# Patient Record
Sex: Male | Born: 1935 | Race: White | Hispanic: No | Marital: Single | Smoking: Never smoker
Health system: Southern US, Community
[De-identification: ages and names within clinical notes are randomized; demographics above are authoritative.]

## PROBLEM LIST (undated history)

## (undated) DIAGNOSIS — I10 Essential (primary) hypertension: Secondary | ICD-10-CM

## (undated) DIAGNOSIS — Z9989 Dependence on other enabling machines and devices: Secondary | ICD-10-CM

## (undated) DIAGNOSIS — K5792 Diverticulitis of intestine, part unspecified, without perforation or abscess without bleeding: Secondary | ICD-10-CM

## (undated) DIAGNOSIS — I442 Atrioventricular block, complete: Secondary | ICD-10-CM

## (undated) DIAGNOSIS — R001 Bradycardia, unspecified: Secondary | ICD-10-CM

## (undated) DIAGNOSIS — H35719 Central serous chorioretinopathy, unspecified eye: Secondary | ICD-10-CM

## (undated) DIAGNOSIS — I453 Trifascicular block: Secondary | ICD-10-CM

## (undated) DIAGNOSIS — H409 Unspecified glaucoma: Secondary | ICD-10-CM

## (undated) DIAGNOSIS — J45909 Unspecified asthma, uncomplicated: Secondary | ICD-10-CM

## (undated) DIAGNOSIS — H3530 Unspecified macular degeneration: Secondary | ICD-10-CM

## (undated) DIAGNOSIS — H35349 Macular cyst, hole, or pseudohole, unspecified eye: Secondary | ICD-10-CM

## (undated) DIAGNOSIS — E785 Hyperlipidemia, unspecified: Secondary | ICD-10-CM

## (undated) DIAGNOSIS — R55 Syncope and collapse: Secondary | ICD-10-CM

## (undated) DIAGNOSIS — N400 Enlarged prostate without lower urinary tract symptoms: Secondary | ICD-10-CM

## (undated) DIAGNOSIS — I472 Ventricular tachycardia: Secondary | ICD-10-CM

## (undated) DIAGNOSIS — M1990 Unspecified osteoarthritis, unspecified site: Secondary | ICD-10-CM

## (undated) DIAGNOSIS — H401190 Primary open-angle glaucoma, unspecified eye, stage unspecified: Secondary | ICD-10-CM

## (undated) DIAGNOSIS — G4733 Obstructive sleep apnea (adult) (pediatric): Secondary | ICD-10-CM

## (undated) DIAGNOSIS — K635 Polyp of colon: Secondary | ICD-10-CM

## (undated) DIAGNOSIS — G250 Essential tremor: Secondary | ICD-10-CM

## (undated) DIAGNOSIS — Z961 Presence of intraocular lens: Secondary | ICD-10-CM

## (undated) DIAGNOSIS — E7800 Pure hypercholesterolemia, unspecified: Secondary | ICD-10-CM

## (undated) HISTORY — DX: Presence of intraocular lens: Z96.1

## (undated) HISTORY — DX: Central serous chorioretinopathy, unspecified eye: H35.719

## (undated) HISTORY — DX: Bradycardia, unspecified: R00.1

## (undated) HISTORY — PX: BACK SURGERY: SHX140

## (undated) HISTORY — DX: Unspecified macular degeneration: H35.30

## (undated) HISTORY — DX: Primary open-angle glaucoma, unspecified eye, stage unspecified: H40.1190

## (undated) HISTORY — DX: Essential (primary) hypertension: I10

## (undated) HISTORY — DX: Hyperlipidemia, unspecified: E78.5

## (undated) HISTORY — DX: Diverticulitis of intestine, part unspecified, without perforation or abscess without bleeding: K57.92

## (undated) HISTORY — DX: Pure hypercholesterolemia, unspecified: E78.00

## (undated) HISTORY — DX: Benign prostatic hyperplasia without lower urinary tract symptoms: N40.0

## (undated) HISTORY — DX: Ventricular tachycardia: I47.2

## (undated) HISTORY — DX: Unspecified glaucoma: H40.9

## (undated) HISTORY — DX: Macular cyst, hole, or pseudohole, unspecified eye: H35.349

## (undated) HISTORY — DX: Obstructive sleep apnea (adult) (pediatric): G47.33

## (undated) HISTORY — DX: Trifascicular block: I45.3

## (undated) HISTORY — DX: Polyp of colon: K63.5

## (undated) HISTORY — DX: Syncope and collapse: R55

## (undated) HISTORY — PX: CATARACT EXTRACTION, BILATERAL: SHX1313

## (undated) HISTORY — PX: HERNIA REPAIR: SHX51

## (undated) HISTORY — DX: Unspecified asthma, uncomplicated: J45.909

## (undated) HISTORY — DX: Obstructive sleep apnea (adult) (pediatric): Z99.89

## (undated) HISTORY — DX: Unspecified osteoarthritis, unspecified site: M19.90

---

## 1959-04-29 HISTORY — PX: OTHER SURGICAL HISTORY: SHX169

## 2003-04-29 HISTORY — PX: REPLACEMENT TOTAL KNEE: SUR1224

## 2004-04-28 HISTORY — PX: VITRECTOMY: SHX106

## 2013-04-11 ENCOUNTER — Other Ambulatory Visit: Payer: Self-pay | Admitting: Internal Medicine

## 2013-04-11 DIAGNOSIS — R911 Solitary pulmonary nodule: Secondary | ICD-10-CM

## 2013-05-09 ENCOUNTER — Inpatient Hospital Stay: Admission: RE | Admit: 2013-05-09 | Payer: Self-pay | Source: Ambulatory Visit

## 2013-05-11 ENCOUNTER — Ambulatory Visit
Admission: RE | Admit: 2013-05-11 | Discharge: 2013-05-11 | Disposition: A | Discharge status: Home / Self Care | Payer: Medicare Other | Source: Ambulatory Visit | Attending: Internal Medicine | Admitting: Internal Medicine

## 2013-05-11 DIAGNOSIS — R911 Solitary pulmonary nodule: Secondary | ICD-10-CM

## 2013-05-11 MED ORDER — IOHEXOL 300 MG/ML  SOLN
75.0000 mL | Freq: Once | INTRAMUSCULAR | Status: AC | PRN
  Administered 2013-05-11: 75 mL via INTRAVENOUS

## 2014-03-31 ENCOUNTER — Encounter: Payer: Self-pay | Admitting: Neurology

## 2014-05-08 ENCOUNTER — Encounter: Payer: Self-pay | Admitting: *Deleted

## 2014-05-08 ENCOUNTER — Encounter: Payer: Self-pay | Admitting: Neurology

## 2014-05-08 ENCOUNTER — Ambulatory Visit (INDEPENDENT_AMBULATORY_CARE_PROVIDER_SITE_OTHER): Payer: 59 | Admitting: Neurology

## 2014-05-08 VITALS — BP 122/81 | HR 75 | Resp 12 | Ht 69.25 in | Wt 201.0 lb

## 2014-05-08 DIAGNOSIS — G4710 Hypersomnia, unspecified: Secondary | ICD-10-CM

## 2014-05-08 DIAGNOSIS — G4733 Obstructive sleep apnea (adult) (pediatric): Secondary | ICD-10-CM | POA: Insufficient documentation

## 2014-05-08 DIAGNOSIS — Z9989 Dependence on other enabling machines and devices: Principal | ICD-10-CM

## 2014-05-08 NOTE — Progress Notes (Signed)
SLEEP MEDICINE CLINIC   Provider:  Melvyn Novas, M D  Referring Provider: Ezequiel Kayser, MD Primary Care Physician:  Nicholas Kayser, MD  Chief Complaint  Patient presents with  . NP Nicholas Robbins  Sleep consult    OSA/cpap Rm 11, alone  . DME none here    Moved from [place] yr ago, needing DME co and supplies    HPI:  Nicholas Robbins is a 81 y.o. male  seen here as a referral from Dr. 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 He needs to establish himself with a new DME and sleep clinic.   He received his diagnosis of obstructive sleep apnea approximately 5 years ago and has used CPAP nightly. He carries the diagnosis of hypertension and has been treated with medications since 2000 he has benign prostate hyperplasia, and a coronary arthrosclerosis was diagnosed by chest CT in generally 2015. Dilated pulmonary arteries in generally 15 suspect that this is remote pulmonary embolism. And he had whooping cough as a child.  His usual bedtime is 10:30 PM, he will promptly go to sleep within 5-10 minutes. He lives alone and there is no report of snoring but he does not feel that there is any effect of this being present. He wakes up usually restored or refreshed and has the most energy in the morning. His overall nocturnal sleep time is more than 6 hours and he rises at 5:30 AM regularly. He sets breakfasts for the guess that the pain center the morning has this as a routine as well, he drinks 2 cups of coffee in the morning, very rarely any daytime caffeine especially not in the form of soda. Every other day he will get a daytime nap of 30 minutes duration. A power nap. And he feels that this is refreshing. He is using a nasal CPAP mask interface. This has been described as comfortable or at least not bothersome to him. He has 2 nocturias at night.   No trauma to  neck or face, no tonsllectomy or nasal surgery. No TBI.     We were able today to get a compliance report from Nicholas Robbins's CPAP machine and it shows 100% compliance. He has used the machine on average 7 hours and 57 minutes at night he uses a ramp start at 4 cm water and a CPAP pressure of 5.5 cm water. His overall AHI also known as the apnea hypotony index was not elicited by this reading. There has also been no alarm set for air leakage. He has a C-Flex setting of 2 cm water.       Review of Systems: Epworth sleepiness score is endorsed at 15 points and fatigue severity at 26 points. Symptoms of depression : not  present. Out of a complete 14 system review, the patient complains of only the following symptoms, and all other reviewed systems are negative.   Single, master's degree , religous studies. 10 semester.      Family History  Problem Relation Age of Onset  . Lymphoma Brother   . Ulcers Nicholas 69    bleeding/ deceased    Past Medical History  Diagnosis Date  . Hypertension     Past Surgical History  Procedure Laterality Date  . Broken ankle  1961  . Vitrectomy Left 2006  . Replacement total knee  2005  Current Outpatient Prescriptions  Medication Sig Dispense Refill  . aspirin 81 MG tablet Take 81 mg by mouth daily.    . cholecalciferol (VITAMIN D) 400 UNITS TABS tablet Take 1,000 Units by mouth daily.    . fexofenadine (ALLEGRA) 180 MG tablet Take 180 mg by mouth daily as needed for allergies or rhinitis.    . fluocinonide (LIDEX) 0.05 % external solution Apply 1 application topically 2 (two) times daily. Instil 2 gtts in ear twice weekly    . folic acid (FOLVITE) 800 MCG tablet Take 400 mcg by mouth daily.    . Garlic 1000 MG CAPS Take 1,000 mg by mouth 4 (four) times daily.    Marland Kitchen. glucosamine-chondroitin 500-400 MG tablet Take 1 tablet by mouth 2 (two) times daily.    . hyoscyamine (LEVSIN SL) 0.125 MG SL tablet Place 0.125 mg under the tongue every 8  (eight) hours as needed for cramping.    . Multiple Vitamin (MULTIVITAMIN) tablet Take 1 tablet by mouth daily. Shaklee vitamin w/o iron    . olmesartan (BENICAR) 40 MG tablet Take 40 mg by mouth daily.    . pravastatin (PRAVACHOL) 20 MG tablet Take 20 mg by mouth at bedtime.  3  . silodosin (RAPAFLO) 8 MG CAPS capsule Take 8 mg by mouth daily with breakfast.    . Travoprost, BAK Free, (TRAVATAN) 0.004 % SOLN ophthalmic solution Place 1 drop into both eyes at bedtime.    . triamcinolone (NASACORT ALLERGY 24HR) 55 MCG/ACT AERO nasal inhaler Place 2 sprays into the nose daily.    Marland Kitchen. UNABLE TO FIND Take 1 capsule by mouth 4 (four) times daily. Med Name: Juiceplus    . UNABLE TO FIND Med Name: cod liver oil  1 tsp qday    . vitamin B-12 (CYANOCOBALAMIN) 1000 MCG tablet Take 1,000 mcg by mouth daily.     No current facility-administered medications for this visit.    Allergies as of 05/08/2014  . (No Known Allergies)    Vitals: BP 122/81 mmHg  Pulse 75  Resp 12  Ht 5' 9.25" (1.759 m)  Wt 201 lb (91.173 kg)  BMI 29.47 kg/m2 Last Weight:  Wt Readings from Last 1 Encounters:  05/08/14 201 lb (91.173 kg)       Last Height:   Ht Readings from Last 1 Encounters:  05/08/14 5' 9.25" (1.759 m)    Physical exam:  General: The patient is awake, alert and appears not in acute distress. The patient is well groomed. Head: Normocephalic, atraumatic. Neck is supple. Mallampati 3 ,  neck circumference: 16. Nasal airflow congested, TMJ is by click on the right jaw evident . Retrognathia is not seen.  Cardiovascular:  Regular rate and rhythm, without  murmurs or carotid bruit, and without distended neck veins. Respiratory: Lungs are clear to auscultation. Skin:  Without evidence of edema, or rash Trunk: scoliosis   Neurologic exam : The patient is awake and alert, oriented to place and time.   Memory subjective   described as intact. There is a normal attention span & concentration ability. Speech  is fluent without  dysarthria, dysphonia or aphasia. Mood and affect are appropriate.  Cranial nerves: Pupils are equal and unreactive to light. Funduscopic exam with evidence of a air-bubble, cataract related lens replacement refraction on the left Extraocular movements in vertical and horizontal planes intact and without nystagmus. Visual fields by finger perimetry are intact. Hearing to finger rub intact.  Facial sensation intact to fine touch. Facial motor  strength is symmetric and tongue and uvula move midline.  Motor exam:   Normal tone ,muscle bulk and symmetric strength in all extremities.  Sensory:  Fine touch, pinprick and vibration were tested in all extremities. Proprioception is  normal.  Coordination: Rapid alternating movements in the fingers/hands is normal. Finger-to-nose maneuver  normal without evidence of ataxia, dysmetria or tremor.  Gait and station: Patient walks without assistive device and is able unassisted to climb up to the exam table. Strength within normal limits. Stance is stable and normal.  Deep tendon reflexes: in the  upper and lower extremities are symmetric and intact. Babinski maneuver  downgoing.   Assessment:  After physical and neurologic examination, review of laboratory studies, imaging, neurophysiology testing and pre-existing records, assessment is :  OSA diagnosed 2010 in [place] - degree of apnea not known, he was told he was " severe " . My download in- office did not elicit an AHI.    The patient was advised of the nature of the diagnosed sleep disorder , the treatment options and risks for general a health and wellness arising from not treating the condition. Visit duration was 30 minutes.  More than 505 of the time were reserved for information, discussion about OSA and the treatment option.   Plan:  Treatment plan and additional workup :  Patient with likely moderate to severe OSA by history , now compliant 100% on CPAP.  Still elevated  Epworth suggestive of hypersomnia, day time naps , etc.  I would like to refer to DME advanced home care, he would otherwise need a loaner machine for a AHI download. He needs new supplies and would like to stay with the style of jiscurrent interface and head gear. Porfirio Mylar Jolee Critcher MD  05/08/2014

## 2014-06-16 DIAGNOSIS — R9439 Abnormal result of other cardiovascular function study: Secondary | ICD-10-CM | POA: Insufficient documentation

## 2014-08-28 ENCOUNTER — Ambulatory Visit: Payer: 59 | Admitting: Neurology

## 2014-10-09 ENCOUNTER — Ambulatory Visit (INDEPENDENT_AMBULATORY_CARE_PROVIDER_SITE_OTHER): Payer: Medicare Other | Admitting: Neurology

## 2014-10-09 ENCOUNTER — Encounter: Payer: Self-pay | Admitting: Neurology

## 2014-10-09 VITALS — BP 98/60 | HR 72 | Resp 20 | Ht 69.29 in | Wt 196.0 lb

## 2014-10-09 DIAGNOSIS — G4733 Obstructive sleep apnea (adult) (pediatric): Secondary | ICD-10-CM

## 2014-10-09 DIAGNOSIS — Z9989 Dependence on other enabling machines and devices: Principal | ICD-10-CM

## 2014-10-09 NOTE — Patient Instructions (Signed)
CPAP care intructions  given ,   [facility] to follow,

## 2014-10-09 NOTE — Progress Notes (Signed)
SLEEP MEDICINE CLINIC   Provider:  Melvyn Novas, M D  Referring Provider: Rodrigo Ran, MD Primary Care Physician:  Nicholas Kayser, MD  Chief Complaint  Patient presents with  . Follow-up    cpap f/u, rm 10, alone    HPI:  Nicholas Robbins is a 81 y.o. 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 He needs to establish himself with a new DME and sleep clinic.  He received his diagnosis of obstructive sleep apnea approximately 5 years ago and has used CPAP nightly. He carries the diagnosis of hypertension and has been treated with medications since 2000 he has benign prostate hyperplasia, and a coronary arthrosclerosis was diagnosed by chest CT in generally 2015. Dilated pulmonary arteries in generally 15 suspect that this is remote pulmonary embolism. And he had whooping cough as a child. His usual bedtime is 10:30 PM, he will promptly go to sleep within 5-10 minutes. He lives alone and there is no report of snoring but he does not feel that there is any effect of this being present. He wakes up usually restored or refreshed and has the most energy in the morning. His overall nocturnal sleep time is more than 6 hours and he rises at 5:30 AM regularly. He sets breakfasts for the guess that the pain center the morning has this as a routine as well, he drinks 2 cups of coffee in the morning, very rarely any daytime caffeine especially not in the form of soda. Every other day he will get a daytime nap of 30 minutes duration. A power nap. And he feels that this is refreshing. He is using a nasal CPAP mask interface. This has been described as comfortable or at least not bothersome to him. He has 2 nocturias at night. No trauma to neck or face, no tonsllectomy or nasal surgery. No TBI.  We were able today to get a compliance  report from Nicholas Robbins's CPAP machine and it shows 100% compliance. He has used the machine on average 7 hours and 57 minutes at night he uses a ramp start at 4 cm water and a CPAP pressure of 5.5 cm water. His overall AHI also known as the apnea hypotony index was not elicited by this reading. There has also been no alarm set for air leakage. He has a C-Flex setting of 2 cm water.   10-09-14 : Continues to work at [employer], [address]. He uses CPAP faithfully-  100% over 4 hours, residual AHI 2 at 5.5 cm water, very low.    Review of Systems: Epworth sleepiness score is endorsed at  11 from 15 points and fatigue severity at  33 from 26 points.  Symptoms of depression : not  present. Out of a complete 14 system review, the patient complains of only the following symptoms, and all other reviewed systems are negative. Single, master's degree , religous studies. 10 semester.      Family History  Problem Relation Age of Onset  . Lymphoma Brother   . Ulcers Nicholas 2    bleeding/ deceased    Past Medical History  Diagnosis Date  . Hypertension   . BPH (benign prostatic hyperplasia)     Past Surgical History  Procedure Laterality Date  . Broken ankle  1961  . Vitrectomy  Left 2006  . Replacement total knee  2005    Current Outpatient Prescriptions  Medication Sig Dispense Refill  . aspirin 81 MG tablet Take 81 mg by mouth daily.    . carvedilol (COREG) 6.25 MG tablet Take 6.25 mg by mouth 2 (two) times daily.  2  . cholecalciferol (VITAMIN D) 400 UNITS TABS tablet Take 1,000 Units by mouth daily.    . fexofenadine (ALLEGRA) 180 MG tablet Take 180 mg by mouth daily as needed for allergies or rhinitis.    . finasteride (PROSCAR) 5 MG tablet TAKE 1 TABLET DAILY AS DIRECTED.  3  . fluocinonide (LIDEX) 0.05 % external solution Apply 1 application topically 2 (two) times daily. Instil 2 gtts in ear twice weekly    . folic acid (FOLVITE) 800 MCG tablet Take 400 mcg by  mouth daily.    . Garlic 1000 MG CAPS Take 1,000 mg by mouth 4 (four) times daily.    Marland Kitchen glucosamine-chondroitin 500-400 MG tablet Take 1 tablet by mouth 2 (two) times daily.    . hyoscyamine (LEVSIN SL) 0.125 MG SL tablet Place 0.125 mg under the tongue every 8 (eight) hours as needed for cramping.    . Multiple Vitamin (MULTIVITAMIN) tablet Take 1 tablet by mouth daily. Shaklee vitamin w/o iron    . olmesartan (BENICAR) 40 MG tablet Take 20 mg by mouth daily.     . pravastatin (PRAVACHOL) 20 MG tablet Take 40 mg by mouth at bedtime.   3  . silodosin (RAPAFLO) 8 MG CAPS capsule Take 8 mg by mouth daily with breakfast.    . Travoprost, BAK Free, (TRAVATAN) 0.004 % SOLN ophthalmic solution Place 1 drop into both eyes at bedtime.    . triamcinolone (NASACORT ALLERGY 24HR) 55 MCG/ACT AERO nasal inhaler Place 2 sprays into the nose daily.    Marland Kitchen UNABLE TO FIND Take 1 capsule by mouth 4 (four) times daily. Med Name: Juiceplus    . UNABLE TO FIND Med Name: cod liver oil  1 tsp qday    . vitamin B-12 (CYANOCOBALAMIN) 1000 MCG tablet Take 1,000 mcg by mouth daily.     No current facility-administered medications for this visit.    Allergies as of 10/09/2014  . (No Known Allergies)    Vitals: BP 98/60 mmHg  Pulse 72  Resp 20  Ht 5' 9.29" (1.76 m)  Wt 196 lb (88.905 kg)  BMI 28.70 kg/m2 Last Weight:  Wt Readings from Last 1 Encounters:  10/09/14 196 lb (88.905 kg)       Last Height:   Ht Readings from Last 1 Encounters:  10/09/14 5' 9.29" (1.76 m)    Physical exam:  General: The patient is awake, alert and appears not in acute distress. The patient is well groomed. Head: Normocephalic, atraumatic. Neck is supple. Mallampati 3 ,  neck circumference: 16. Nasal airflow congested, TMJ is by click on the right jaw evident . Retrognathia is not seen.  Cardiovascular:  Regular rate and rhythm, without  murmurs or carotid bruit, and without distended neck veins. Respiratory: Lungs are clear to  auscultation. Skin:  Without evidence of edema, or rash Trunk: scoliosis   Neurologic exam : The patient is awake and alert, oriented to place and time.   Memory subjective described as intact. There is a normal attention span & concentration ability. Speech is fluent without  dysarthria, dysphonia or aphasia.  Mood and affect are appropriate.  Cranial nerves: Pupils are equal and unreactive to light. Funduscopic  exam with evidence of a air-bubble, cataract related lens replacement refraction on the left Extraocular movements in vertical and horizontal planes intact and without nystagmus. Visual fields by finger perimetry are intact. Hearing to finger rub intact. Facial sensation intact to fine touch. Facial motor strength is symmetric and tongue and uvula move midline.   Assessment:  After physical and neurologic examination, review of laboratory studies, imaging, neurophysiology testing and pre-existing records, assessment is :  OSA diagnosed 2010 in [place] - degree of apnea not known, he was told he was " severe " . My download in- office did not elicit an AHI.    The patient was advised of the nature of the diagnosed sleep disorder , the treatment options and risks for general a health and wellness arising from not treating the condition. Visit duration was 30 minutes.  More than 505 of the time were reserved for information, discussion about OSA and the treatment option.   Plan:  Treatment plan and additional workup :  Patient with likely moderate to severe OSA by history , compliant 100% on CPAP.  I will reduce the RAMP time from 30 minutes to 8 minutes.   I would like to refer to DME advanced home care, he would otherwise need a loaner machine for a AHI download.  He needs new supplies and would like to stay with the style of current interface and head gear. Porfirio Mylar Kimberlynn Lumbra MD  10/09/2014

## 2015-03-12 ENCOUNTER — Other Ambulatory Visit: Payer: Self-pay | Admitting: Internal Medicine

## 2015-03-12 DIAGNOSIS — M545 Low back pain: Secondary | ICD-10-CM

## 2015-03-14 ENCOUNTER — Ambulatory Visit
Admission: RE | Admit: 2015-03-14 | Discharge: 2015-03-14 | Disposition: A | Discharge status: Home / Self Care | Payer: Medicare Other | Source: Ambulatory Visit | Attending: Internal Medicine | Admitting: Internal Medicine

## 2015-03-14 ENCOUNTER — Other Ambulatory Visit: Payer: Self-pay

## 2015-03-14 DIAGNOSIS — M545 Low back pain: Secondary | ICD-10-CM

## 2015-04-12 ENCOUNTER — Ambulatory Visit: Payer: Medicare Other | Admitting: Neurology

## 2015-04-24 ENCOUNTER — Encounter: Payer: Self-pay | Admitting: Neurology

## 2015-04-24 ENCOUNTER — Ambulatory Visit (INDEPENDENT_AMBULATORY_CARE_PROVIDER_SITE_OTHER): Payer: Medicare Other | Admitting: Neurology

## 2015-04-24 VITALS — BP 110/60 | HR 68 | Resp 20 | Ht 69.0 in | Wt 199.0 lb

## 2015-04-24 DIAGNOSIS — G4733 Obstructive sleep apnea (adult) (pediatric): Secondary | ICD-10-CM

## 2015-04-24 DIAGNOSIS — Z9989 Dependence on other enabling machines and devices: Principal | ICD-10-CM

## 2015-04-24 NOTE — Progress Notes (Signed)
SLEEP MEDICINE CLINIC   Provider:  Melvyn Novas, M D  Referring Provider: Rodrigo Ran, MD Primary Care Physician:  Nicholas Kayser, MD  Chief Complaint  Patient presents with  . Follow-up    cpap, going well, rm 11, alone    HPI:  Nicholas Robbins is a 81 y.o. male  seen here as a referral from Dr. 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 He needs to establish himself with a new DME and sleep clinic.  He received his diagnosis of obstructive sleep apnea approximately 5 years ago and has used CPAP nightly. He carries the diagnosis of hypertension and has been treated with medications since 2000 he has benign prostate hyperplasia, and a coronary arthrosclerosis was diagnosed by chest CT in generally 2015. Dilated pulmonary arteries in generally 15 suspect that this is remote pulmonary embolism. And he had whooping cough as a child.  His usual bedtime is 10:30 PM, he will promptly go to sleep within 5-10 minutes. He lives alone and there is no report of snoring but he does not feel that there is any effect of this being present. He wakes up usually restored or refreshed and has the most energy in the morning. His overall nocturnal sleep time is more than 6 hours and he rises at 5:30 AM regularly. He sets breakfasts for the guess that the pain center the morning has this as a routine as well, he drinks 2 cups of coffee in the morning, very rarely any daytime caffeine especially not in the form of soda. Every other day he will get a daytime nap of 30 minutes duration. A power nap. And he feels that this is refreshing. He is using a nasal CPAP mask interface. This has been described as comfortable or at least not bothersome to him. He has 2 nocturias at night. No trauma to neck or face, no tonsllectomy or nasal surgery. No TBI.  We were able today to get a  compliance report from Nicholas Robbins's CPAP machine and it shows 100% compliance. He has used the machine on average 7 hours and 57 minutes at night he uses a ramp start at 4 cm water and a CPAP pressure of 5.5 cm water. His overall AHI also known as the apnea hypotony index was not elicited by this reading. There has also been no alarm set for air leakage. He has a C-Flex setting of 2 cm water.   10-09-14 : Continues to work at [employer], [address]. He uses CPAP faithfully-  100% over 4 hours, residual AHI 2 at 5.5 cm water, very low.   04-24-15: I reviewed today the CPAP download including the days of 12-20 6-16. The patient again has 100% compliance and a correlative usage of over 8 days and 22 hours. Average daily usage is 7 hours and 9 minutes. The patient is highly compliant but his residual AHI is not visible-  He is on a C-Flex setting of 2 cm water. He sleeps on average about 7 hours at night, going to bed at 10:30 and waking up at 5:30. He falls asleep promptly.   Review of Systems: Epworth sleepiness score is endorsed at  11 from 15 points and fatigue severity at  33 from 26 points.  Symptoms of depression : not  present. Out of a complete 14 system review, the  patient complains of only the following symptoms, and all other reviewed systems are negative. Single, master's degree , religous studies. 10 semester.      Family History  Problem Relation Age of Onset  . Lymphoma Brother   . Ulcers Nicholas 86    bleeding/ deceased    Past Medical History  Diagnosis Date  . Hypertension   . BPH (benign prostatic hyperplasia)     Past Surgical History  Procedure Laterality Date  . Broken ankle  1961  . Vitrectomy Left 2006  . Replacement total knee  2005    Current Outpatient Prescriptions  Medication Sig Dispense Refill  . aspirin 81 MG tablet Take 81 mg by mouth daily.    Marland Kitchen BENICAR 20 MG tablet Take 20 mg by mouth daily.  3  . carvedilol (COREG) 6.25 MG  tablet Take 6.25 mg by mouth 2 (two) times daily.  2  . cholecalciferol (VITAMIN D) 400 UNITS TABS tablet Take 1,000 Units by mouth daily.    . finasteride (PROSCAR) 5 MG tablet TAKE 1 TABLET DAILY AS DIRECTED.  3  . fluocinonide (LIDEX) 0.05 % external solution Apply 1 application topically 2 (two) times daily. Instil 2 gtts in ear twice weekly    . folic acid (FOLVITE) 800 MCG tablet Take 400 mcg by mouth daily.    . Garlic 1000 MG CAPS Take 1,000 mg by mouth 4 (four) times daily.    Marland Kitchen glucosamine-chondroitin 500-400 MG tablet Take 1 tablet by mouth 2 (two) times daily.    . hyoscyamine (LEVSIN SL) 0.125 MG SL tablet Place 0.125 mg under the tongue every 8 (eight) hours as needed for cramping.    . Multiple Vitamin (MULTIVITAMIN) tablet Take 1 tablet by mouth daily. Shaklee vitamin w/o iron    . pravastatin (PRAVACHOL) 40 MG tablet Take 40 mg by mouth at bedtime.  2  . silodosin (RAPAFLO) 8 MG CAPS capsule Take 8 mg by mouth daily with breakfast.    . Travoprost, BAK Free, (TRAVATAN) 0.004 % SOLN ophthalmic solution Place 1 drop into both eyes at bedtime.    . triamcinolone (NASACORT ALLERGY 24HR) 55 MCG/ACT AERO nasal inhaler Place 2 sprays into the nose daily.    Marland Kitchen UNABLE TO FIND Take 1 capsule by mouth 4 (four) times daily. Med Name: Juiceplus    . UNABLE TO FIND Med Name: cod liver oil  1 tsp qday    . vitamin B-12 (CYANOCOBALAMIN) 1000 MCG tablet Take 1,000 mcg by mouth daily.     No current facility-administered medications for this visit.    Allergies as of 04/24/2015  . (No Known Allergies)    Vitals: BP 110/60 mmHg  Pulse 68  Resp 20  Ht  (1.753 m)  Wt 199 lb (90.266 kg)  BMI 29.37 kg/m2 Last Weight:  Wt Readings from Last 1 Encounters:  04/24/15 199 lb (90.266 kg)       Last Height:   Ht Readings from Last 1 Encounters:  04/24/15  (1.753 m)    Physical exam:  General: The patient is awake, alert and appears not in acute distress. The patient is well  groomed. Head: Normocephalic, atraumatic. Neck is supple. Mallampati 3.  neck circumference: 16. Nasal airflow congested, TMJ is by click on the right jaw evident . Retrognathia is not seen.  Cardiovascular:  Regular rate and rhythm, without  murmurs or carotid bruit, and without distended neck veins. Respiratory: Lungs are clear to auscultation. Skin:  Without evidence  of edema, or rash Trunk: scoliosis  , lumbar fusion, hip pain.  Neurologic exam : The patient is awake and alert, oriented to place and time.   Memory subjective described as intact. There is a normal attention span & concentration ability. Speech is fluent without  dysarthria, dysphonia or aphasia.  Mood and affect are appropriate.  Cranial nerves: Pupils are equal and unreactive to light. Funduscopic exam with evidence of a air-bubble,bilaterally  cataract related lens replacement   , no  nystagmus. Visual fields by finger perimetry are intact. Hearing to finger rub intact. Facial sensation intact to fine touch. Facial motor strength is symmetric and tongue and uvula move midline.   Assessment:  After physical and neurologic examination, review of laboratory studies, imaging, neurophysiology testing and pre-existing records, assessment is :  OSA diagnosed 2010 in [place] - degree of apnea not known, he was told he was " severe " . My download in- office did not elicit an AHI.   The patient was advised of the nature of the diagnosed sleep disorder , the treatment options and risks for general a health and wellness arising from not treating the condition. Visit duration was 10 minutes.  More than 50% of the time were reserved for information, discussion about OSA and the treatment option.    Plan:  Treatment plan and additional workup :  Patient with likely moderate to severe OSA by history , compliant 100% on CPAP.  RAMP time 8 minutes. Rhinitis treated with Nasocort, but little reduction. Try Zyrtec daily.  I would like  to refer to [facility], he would otherwise need a loaner machine for a AHI download.  He needs new supplies and would like to stay with the style of current interface and head gear.   Nocturia once at night.  He is receiving steroid injections for back pain, and right hip pain- Dr. Danielle DessElsner sees him. obtained an MRI 2-3-4-5 lumbar pain, status post fusion in [place].  Rv with CPAP yearly.  I like to obtain a residual AHI - please have DME send a special download. [facility].   Porfirio Mylararmen Salina Stanfield MD  04/24/2015

## 2016-02-19 ENCOUNTER — Telehealth: Payer: Self-pay | Admitting: Neurology

## 2016-02-19 DIAGNOSIS — Z9989 Dependence on other enabling machines and devices: Secondary | ICD-10-CM

## 2016-02-19 DIAGNOSIS — G4733 Obstructive sleep apnea (adult) (pediatric): Secondary | ICD-10-CM

## 2016-02-19 NOTE — Telephone Encounter (Signed)
Pt called to advise he needs a new mask and tubing. Pt said he does not know name of the machine he has.

## 2016-02-19 NOTE — Addendum Note (Signed)
Addended by: Geronimo RunningINKINS, Madiline Saffran A on: 02/19/2016 02:25 PM   Modules accepted: Orders

## 2016-02-19 NOTE — Telephone Encounter (Signed)
I spoke to pt. He needs an order for a [facility] mask and tubing [facility]. I advised him that we would send that order today but to give [facility] 48 hours to process the order. Pt verbalized understanding.

## 2016-04-14 ENCOUNTER — Encounter: Payer: Self-pay | Admitting: Internal Medicine

## 2016-04-15 ENCOUNTER — Encounter: Payer: Self-pay | Admitting: Gastroenterology

## 2016-04-23 ENCOUNTER — Ambulatory Visit: Payer: Medicare Other | Admitting: Neurology

## 2016-04-30 ENCOUNTER — Encounter: Payer: Self-pay | Admitting: Gastroenterology

## 2016-04-30 ENCOUNTER — Ambulatory Visit (INDEPENDENT_AMBULATORY_CARE_PROVIDER_SITE_OTHER): Payer: Medicare Other | Admitting: Gastroenterology

## 2016-04-30 VITALS — BP 120/70 | HR 60 | Ht 69.0 in | Wt 195.4 lb

## 2016-04-30 DIAGNOSIS — R110 Nausea: Secondary | ICD-10-CM | POA: Diagnosis not present

## 2016-04-30 DIAGNOSIS — R1032 Left lower quadrant pain: Secondary | ICD-10-CM | POA: Insufficient documentation

## 2016-04-30 NOTE — Patient Instructions (Signed)
You have been scheduled for a CT scan of the abdomen and pelvis at [facility] ([address]---this is in the same building as [facility]).   You are scheduled on 05/01/16 at 2:30 pm. You should arrive 15 minutes prior to your appointment time for registration. Please follow the written instructions below on the day of your exam:  WARNING: IF YOU ARE ALLERGIC TO IODINE/X-RAY DYE, PLEASE NOTIFY RADIOLOGY IMMEDIATELY AT [phone]! YOU WILL BE GIVEN A 13 HOUR PREMEDICATION PREP.  1) Do not eat or drink anything after 10:30 am (4 hours prior to your test) 2) You have been given 2 bottles of oral contrast to drink. The solution may taste better if refrigerated, but do NOT add ice or any other liquid to this solution. Shake well before drinking.    Drink 1 bottle of contrast @ 12:30 pm (2 hours prior to your exam)  Drink 1 bottle of contrast @ 1:30 pm  (1 hour prior to your exam)  You may take any medications as prescribed with a small amount of water except for the following: Metformin, Glucophage, Glucovance, Avandamet, Riomet, Fortamet, Actoplus Met, Janumet, Glumetza or Metaglip. The above medications must be held the day of the exam AND 48 hours after the exam.  The purpose of you drinking the oral contrast is to aid in the visualization of your intestinal tract. The contrast solution may cause some diarrhea. Before your exam is started, you will be given a small amount of fluid to drink. Depending on your individual set of symptoms, you may also receive an intravenous injection of x-ray contrast/dye. Plan on being at [facility] for 30 minutes or longer, depending on the type of exam you are having performed.  This test typically takes 30-45 minutes to complete.  If you have any questions regarding your exam or if you need to reschedule, you may call the CT department at [phone] between the hours of 8:00 am and 5:00 pm,  Monday-Friday.  ________________________________________________________________________

## 2016-04-30 NOTE — Progress Notes (Signed)
04/30/2016 SHARVIL HOEY [id number] [birth year]   HISTORY OF PRESENT ILLNESS:  This is an 83 year old male who is new to our practice. He was referred here by Dr. Waynard Edwards for evaluation of left lower quadrant abdominal pain. He tells me that this began about 8 or so weeks ago. He started having left lower quadrant abdominal pain which, he describes as sometimes modest in severity. Also has had associated nausea and possibly some more frequent stools. He saw his PCP and was treated for diverticulitis with Cipro and Flagyl. He says that treatment made no difference in his symptoms. He denies any vomiting. He denies fevers, chills, changes in appetite, or weight loss. He tells me that he has a history of irritable bowel syndrome with frequent stools, but had not had any problems with that for several years. He had some hyoscyamine at home left from previous issues with irritable bowel and started taking some of that recently (taking it twice daily). That does seem to help somewhat. His last colonoscopy he says was about 4 or 5 years ago in [place], but cannot recall exact results (maybe some polyps). Labs by his PCP on December 4 showed a normal CBC, CMP.  Past Medical History:  Diagnosis Date  . Arthritis   . BPH (benign prostatic hyperplasia)   . Colon polyps   . Diverticulitis   . Glaucoma   . Hypertension    Past Surgical History:  Procedure Laterality Date  . broken ankle  1961  . HERNIA REPAIR    . REPLACEMENT TOTAL KNEE  2005  . VITRECTOMY Left 2006    reports that he has never smoked. He has never used smokeless tobacco. He reports that he drinks alcohol. He reports that he does not use drugs. family history includes Breast cancer in his sister; Lymphoma in his brother; Ulcers (age of onset: 63) in his father. No Known Allergies    Outpatient Encounter Prescriptions as of 04/30/2016  Medication Sig  . aspirin 81 MG tablet Take 81 mg by mouth daily.  Marland Kitchen BENICAR 20 MG tablet  Take 20 mg by mouth daily.  . carvedilol (COREG) 6.25 MG tablet Take 6.25 mg by mouth 2 (two) times daily.  . COD LIVER OIL PO Take by mouth daily at 2 PM. One teaspoon  . fexofenadine (ALLEGRA) 180 MG tablet Take 180 mg by mouth daily.  . finasteride (PROSCAR) 5 MG tablet TAKE 1 TABLET DAILY AS DIRECTED.  . fluocinonide (LIDEX) 0.05 % external solution Apply 1 application topically 2 (two) times daily. Instil 2 gtts in ear twice weekly  . folic acid (FOLVITE) 800 MCG tablet Take 400 mcg by mouth daily.  . Garlic 1000 MG CAPS Take 1,000 mg by mouth 4 (four) times daily.  Marland Kitchen glucosamine-chondroitin 500-400 MG tablet Take 1 tablet by mouth 2 (two) times daily.  Marland Kitchen GLUCOSAMINE-CHONDROITIN-MSM-D3 PO Take by mouth 2 (two) times daily.  . hyoscyamine (LEVSIN SL) 0.125 MG SL tablet Place 0.125 mg under the tongue every 8 (eight) hours as needed for cramping.  . Multiple Vitamin (MULTIVITAMIN) tablet Take 1 tablet by mouth daily. Shaklee vitamin w/o iron  . Multiple Vitamins-Minerals (PRESERVISION AREDS PO) Take by mouth 2 (two) times daily.  . NON FORMULARY Shaklee Vita Lea w/o Iron 2 daily  . NON FORMULARY Juice Plus Orchard Blend  2 daily and Garden Blend 2 daily  . pravastatin (PRAVACHOL) 40 MG tablet Take 40 mg by mouth at bedtime.  . silodosin (RAPAFLO) 8  MG CAPS capsule Take 8 mg by mouth daily with breakfast.  . Travoprost, BAK Free, (TRAVATAN) 0.004 % SOLN ophthalmic solution Place 1 drop into both eyes at bedtime.  . triamcinolone (NASACORT ALLERGY 24HR) 55 MCG/ACT AERO nasal inhaler Place 2 sprays into the nose daily.  Marland Kitchen. UNABLE TO FIND Take 1 capsule by mouth 4 (four) times daily. Med Name: Juiceplus  . UNABLE TO FIND Med Name: cod liver oil  1 tsp qday  . vitamin B-12 (CYANOCOBALAMIN) 1000 MCG tablet Take 1,000 mcg by mouth daily.  . [DISCONTINUED] cholecalciferol (VITAMIN D) 400 UNITS TABS tablet Take 1,000 Units by mouth daily.   No facility-administered encounter medications on file as  of 04/30/2016.     REVIEW OF SYSTEMS  : All other systems reviewed and negative except where noted in the History of Present Illness.   PHYSICAL EXAM: BP 120/70   Pulse 60   Ht 5\' 9"  (1.753 m)   Wt 195 lb 6 oz (88.6 kg)   BMI 28.85 kg/m  General: Well developed white male in no acute distress Head: Normocephalic and atraumatic Eyes:  Sclerae anicteric, conjunctiva pink. Ears: Normal auditory acuity Lungs: Clear throughout to auscultation.  No increased WOB. Heart: Regular rate and rhythm Abdomen: Soft, non-distended.  Normal bowel sounds.  Mild to moderate TTP in LLQ. Musculoskeletal: Symmetrical with no gross deformities  Skin: No lesions on visible extremities Extremities: No edema  Neurological: Alert oriented x 4, grossly non-focal Psychological:  Alert and cooperative. Normal mood and affect  ASSESSMENT AND PLAN: -83 year old male with LLQ abdominal pain and nausea:  Not improved with course of abx (cipro and flagyl).  Will check CT scan of the abdomen and pelvis with contrast.  Rule out ongoing diverticulitis, etc.  *Will try to get results of previous colonoscopy report.  CC:  Rodrigo RanPerini, Mark, MD

## 2016-04-30 NOTE — Progress Notes (Signed)
Agree with assessment and plan as outlined.  

## 2016-05-01 ENCOUNTER — Ambulatory Visit (INDEPENDENT_AMBULATORY_CARE_PROVIDER_SITE_OTHER)
Admission: RE | Admit: 2016-05-01 | Discharge: 2016-05-01 | Disposition: A | Discharge status: Home / Self Care | Payer: Medicare Other | Source: Ambulatory Visit | Attending: Gastroenterology | Admitting: Gastroenterology

## 2016-05-01 DIAGNOSIS — R1032 Left lower quadrant pain: Secondary | ICD-10-CM

## 2016-05-01 DIAGNOSIS — R110 Nausea: Secondary | ICD-10-CM

## 2016-05-01 MED ORDER — IOPAMIDOL (ISOVUE-300) INJECTION 61%
100.0000 mL | Freq: Once | INTRAVENOUS | Status: AC | PRN
  Administered 2016-05-01: 100 mL via INTRAVENOUS

## 2016-05-02 ENCOUNTER — Telehealth: Payer: Self-pay | Admitting: Gastroenterology

## 2016-05-02 NOTE — Telephone Encounter (Signed)
See CT results from today for details.  Patient had decided he would like to see surgeon to discuss inguinal hernia repair.  Referral faxed.  He is advised he should hear from [facility] in the next few days for an appt.

## 2016-05-02 NOTE — Telephone Encounter (Signed)
Received fax stating patient has appt with [facility] on 05/13/16 at 9:30 am with Dr. Lindie SpruceWyatt. Patient notified by their office.

## 2016-05-06 ENCOUNTER — Encounter: Payer: Self-pay | Admitting: Neurology

## 2016-05-06 ENCOUNTER — Ambulatory Visit (INDEPENDENT_AMBULATORY_CARE_PROVIDER_SITE_OTHER): Payer: Medicare Other | Admitting: Neurology

## 2016-05-06 ENCOUNTER — Telehealth: Payer: Self-pay

## 2016-05-06 VITALS — BP 110/58 | HR 72 | Resp 16 | Ht 69.0 in | Wt 196.0 lb

## 2016-05-06 DIAGNOSIS — Z9989 Dependence on other enabling machines and devices: Secondary | ICD-10-CM

## 2016-05-06 DIAGNOSIS — G4719 Other hypersomnia: Secondary | ICD-10-CM | POA: Diagnosis not present

## 2016-05-06 DIAGNOSIS — G4733 Obstructive sleep apnea (adult) (pediatric): Secondary | ICD-10-CM | POA: Diagnosis not present

## 2016-05-06 MED ORDER — MODAFINIL 100 MG PO TABS: 100.0000 mg | ORAL_TABLET | Freq: Every day | ORAL | 1 refills | Status: DC

## 2016-05-06 NOTE — Progress Notes (Signed)
SLEEP MEDICINE CLINIC   Provider:  Melvyn Novas, M D  Referring Provider: Rodrigo Ran, MD Primary Care Physician:  Ezequiel Kayser, MD  Chief Complaint  Patient presents with  . Follow-up    Rm 11. No new concerns per patient.     HPI:  Nicholas Robbins is a 83 y.o. male  seen here as a referral from Dr. 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 He needs to establish himself with a new DME and sleep clinic.  He received his diagnosis of obstructive sleep apnea approximately 5 years ago and has used CPAP nightly. He carries the diagnosis of hypertension and has been treated with medications since 2000 he has benign prostate hyperplasia, and a coronary arthrosclerosis was diagnosed by chest CT in generally 2015. Dilated pulmonary arteries in generally 15 suspect that this is remote pulmonary embolism. And he had whooping cough as a child.  His usual bedtime is 10:30 PM, he will promptly go to sleep within 5-10 minutes. He lives alone and there is no report of snoring but he does not feel that there is any effect of this being present. He wakes up usually restored or refreshed and has the most energy in the morning. His overall nocturnal sleep time is more than 6 hours and he rises at 5:30 AM regularly. He sets breakfasts for the guess that the pain center the morning has this as a routine as well, he drinks 2 cups of coffee in the morning, very rarely any daytime caffeine especially not in the form of soda. Every other day he will get a daytime nap of 30 minutes duration. A power nap. And he feels that this is refreshing. He is using a nasal CPAP mask interface. This has been described as comfortable or at least not bothersome to him. He has 2 nocturias at night. No trauma to neck or face, no tonsllectomy or nasal surgery. No TBI.  We were able today to  get a compliance report from Nicholas Robbins CPAP machine and it shows 100% compliance. He has used the machine on average 7 hours and 57 minutes at night he uses a ramp start at 4 cm water and a CPAP pressure of 5.5 cm water. His overall AHI also known as the apnea hypotony index was not elicited by this reading. There has also been no alarm set for air leakage. He has a C-Flex setting of 2 cm water.   10-09-14 : Continues to work at [employer], [address]. He uses CPAP faithfully-  100% over 4 hours, residual AHI 2 at 5.5 cm water, very low.   04-24-15: I reviewed today the CPAP download including the days of 12-20 6-16. The patient again has 100% compliance and a correlative usage of over 8 days and 22 hours. Average daily usage is 7 hours and 9 minutes. The patient is highly compliant but his residual AHI is not visible-  He is on a C-Flex setting of 2 cm water. He sleeps on average about 7 hours at night, going to bed at 10:30 and waking up at 5:30. He falls asleep promptly.  05-07-2015, Held Christmas at the [employer], and a mass in a private home.  Had a healthy happy Holiday time.   He now walks with a cane. Unsteady lower back. And hip pain on the left. Sees  a [place]. Joined a gym. Uses his CPAP.   Review of Systems: Epworth sleepiness score is endorsed at  13 from 15 points and fatigue severity at  33 from 26 points.  Symptoms of depression : not  Present.  Out of a complete 14 system review, the patient complains of only the following symptoms, and all other reviewed systems are negative. Single, master's degree , religous studies. 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  Schedules naps, has driving sleepiness problems.  Modafinil.      Family History  Problem Relation Age of Onset  . Ulcers Nicholas 27    bleeding/ deceased  . Lymphoma Brother   . Breast cancer Sister   . Colon cancer Neg Hx   . Stomach cancer Neg Hx   . Rectal cancer Neg Hx   . Esophageal cancer Neg Hx     . Liver cancer Neg Hx     Past Medical History:  Diagnosis Date  . Arthritis   . BPH (benign prostatic hyperplasia)   . Colon polyps   . Diverticulitis   . Glaucoma   . Hypertension     Past Surgical History:  Procedure Laterality Date  . broken ankle  1961  . HERNIA REPAIR    . REPLACEMENT TOTAL KNEE  2005  . VITRECTOMY Left 2006    Current Outpatient Prescriptions  Medication Sig Dispense Refill  . aspirin 81 MG tablet Take 81 mg by mouth daily.    Marland Kitchen BENICAR 20 MG tablet Take 20 mg by mouth daily.  3  . carvedilol (COREG) 6.25 MG tablet Take 6.25 mg by mouth 2 (two) times daily.  2  . COD LIVER OIL PO Take by mouth daily at 2 PM. One teaspoon    . fexofenadine (ALLEGRA) 180 MG tablet Take 180 mg by mouth daily.    . finasteride (PROSCAR) 5 MG tablet TAKE 1 TABLET DAILY AS DIRECTED.  3  . fluocinonide (LIDEX) 0.05 % external solution Apply 1 application topically 2 (two) times daily. Instil 2 gtts in ear twice weekly    . folic acid (FOLVITE) 800 MCG tablet Take 400 mcg by mouth daily.    . Garlic 1000 MG CAPS Take 1,000 mg by mouth 4 (four) times daily.    Marland Kitchen glucosamine-chondroitin 500-400 MG tablet Take 1 tablet by mouth 2 (two) times daily.    Marland Kitchen GLUCOSAMINE-CHONDROITIN-MSM-D3 PO Take by mouth 2 (two) times daily.    . hyoscyamine (LEVSIN SL) 0.125 MG SL tablet Place 0.125 mg under the tongue every 8 (eight) hours as needed for cramping.    . Multiple Vitamin (MULTIVITAMIN) tablet Take 1 tablet by mouth daily. Shaklee vitamin w/o iron    . Multiple Vitamins-Minerals (PRESERVISION AREDS PO) Take by mouth 2 (two) times daily.    . NON FORMULARY Shaklee Vita Lea w/o Iron 2 daily    . NON FORMULARY Juice Plus Orchard Blend  2 daily and Garden Blend 2 daily    . pravastatin (PRAVACHOL) 40 MG tablet Take 40 mg by mouth at bedtime.  2  . Probiotic Product (ALIGN PO) Take by mouth.    . silodosin (RAPAFLO) 8 MG CAPS capsule Take 8 mg by mouth daily with breakfast.    .  Travoprost, BAK Free, (TRAVATAN) 0.004 % SOLN ophthalmic solution Place 1 drop into both eyes at bedtime.    . triamcinolone (NASACORT ALLERGY 24HR) 55 MCG/ACT AERO nasal inhaler Place 2 sprays into the nose daily.    Marland Kitchen UNABLE TO FIND Take  1 capsule by mouth 4 (four) times daily. Med Name: Juiceplus    . UNABLE TO FIND Med Name: cod liver oil  1 tsp qday    . vitamin B-12 (CYANOCOBALAMIN) 1000 MCG tablet Take 1,000 mcg by mouth daily.     No current facility-administered medications for this visit.     Allergies as of 05/06/2016  . (No Known Allergies)    Vitals: BP (!) 110/58   Pulse 72   Resp 16   Ht 5\' 9"  (1.753 m)   Wt 196 lb (88.9 kg)   BMI 28.94 kg/m  Last Weight:  Wt Readings from Last 1 Encounters:  05/06/16 196 lb (88.9 kg)       Last Height:   Ht Readings from Last 1 Encounters:  05/06/16 5\' 9"  (1.753 m)    Physical exam:  General: The patient is awake, alert and appears not in acute distress. The patient is well groomed. Head: Normocephalic, atraumatic. Neck is supple. Mallampati 3.  neck circumference: 16. Nasal airflow congested, TMJ is by click on the right jaw evident . Retrognathia is not seen.  Cardiovascular:  Regular rate and rhythm, without  murmurs or carotid bruit, and without distended neck veins. Respiratory: Lungs are clear to auscultation. Skin:  Without evidence of edema, or rash Trunk: scoliosis  , lumbar fusion, hip pain.  Neurologic exam : The patient is awake and alert, oriented to place and time.   Memory subjective described as intact. There is a normal attention span & concentration ability. Speech is fluent without  dysarthria, dysphonia or aphasia.  Mood and affect are appropriate.  Cranial nerves: Pupils are equal and unreactive to light. Funduscopic exam with evidence of a air-bubble,bilaterally  cataract related lens replacement   , no  nystagmus. Visual fields by finger perimetry are intact. Hearing to finger rub intact. Facial  sensation intact to fine touch. Facial motor strength is symmetric and tongue and uvula move midline.   Assessment:  After physical and neurologic examination, review of laboratory studies, imaging, neurophysiology testing and pre-existing records, assessment is :  OSA diagnosed 2010 in [place] - degree of apnea not known, he was told he was " severe " . My download in- office did not elicit an AHI.   The patient was advised of the nature of the diagnosed sleep disorder , the treatment options and risks for general a health and wellness arising from not treating the condition. Visit duration was 10 minutes.  More than 50% of the time were reserved for information, discussion about OSA and the treatment option.    Plan:  Treatment plan and additional workup :  Patient with likely moderate to severe OSA by history , compliant 100% on CPAP.  RAMP time 8 minutes. Rhinitis treated with Nasocort, but little reduction. Try Zyrtec daily.  I would like to refer to [facility], he would otherwise need a loaner machine for a AHI download.  He needs new supplies and would like to stay with the style of current interface and head gear.   Nocturia once at night.  He is receiving steroid injections for back pain, and right hip pain- Dr. Danielle Dess sees him. obtained an MRI 2-3-4-5 lumbar pain, status post fusion in [place].  Rv with CPAP yearly.  He had complaint of excessive daytime sleepiness, I suggested modafinil 100 mg tab.  I like to obtain a residual AHI - please have DME send a special download. [facility].   Nicholas Robbins  Paarth Cropper MD  05/06/2016

## 2016-05-06 NOTE — Telephone Encounter (Signed)
[facility] Dr. Vickey Hugerohmeier wanted a more detailed therapy report.

## 2016-05-06 NOTE — Telephone Encounter (Signed)
-----   Message from Peggye FothergillAngela Trotter sent at 05/06/2016 12:20 PM EST ----- I have pulled and sent into office for review. Not sure if his machine will offer this information needed or not. We'll be in touch after RT has reviewed MD request. Thanks.  Angie ----- Message ----- From: Geronimo RunningKristen Dosha Broshears, RN Sent: 05/06/2016  11:14 AM To: Peggye FothergillAngela Trotter  New order in!

## 2016-05-13 ENCOUNTER — Ambulatory Visit: Payer: Self-pay | Admitting: General Surgery

## 2016-11-03 ENCOUNTER — Ambulatory Visit (INDEPENDENT_AMBULATORY_CARE_PROVIDER_SITE_OTHER): Payer: Medicare Other | Admitting: Neurology

## 2016-11-03 ENCOUNTER — Encounter: Payer: Self-pay | Admitting: Neurology

## 2016-11-03 VITALS — BP 115/61 | HR 67 | Ht 68.0 in | Wt 189.0 lb

## 2016-11-03 DIAGNOSIS — Z9989 Dependence on other enabling machines and devices: Secondary | ICD-10-CM | POA: Diagnosis not present

## 2016-11-03 DIAGNOSIS — G4733 Obstructive sleep apnea (adult) (pediatric): Secondary | ICD-10-CM

## 2016-11-03 NOTE — Progress Notes (Signed)
SLEEP MEDICINE CLINIC   Provider:  Melvyn Robbins, M D  Referring Provider: Rodrigo Ran, MD Primary Care Physician:  Nicholas Ran, MD  Chief Complaint  Patient presents with  . Follow-up    HPI:  Nicholas Robbins is a 83 y.o. male  seen here as a referral from Dr. 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 He needs to establish himself with a new DME and sleep clinic.  He received his diagnosis of obstructive sleep apnea approximately 5 years ago and has used CPAP nightly. He carries the diagnosis of hypertension and has been treated with medications since 2000 he has benign prostate hyperplasia, and a coronary arthrosclerosis was diagnosed by chest CT in generally 2015. Dilated pulmonary arteries in generally 15 suspect that this is remote pulmonary embolism. And he had whooping cough as a child. His usual bedtime is 10:30 PM, he will promptly go to sleep within 5-10 minutes. He lives alone and there is no report of snoring but he does not feel that there is any effect of this being present. He wakes up usually restored or refreshed and has the most energy in the morning. His overall nocturnal sleep time is more than 6 hours and he rises at 5:30 AM regularly. He sets breakfasts for the guess that the pain center the morning has this as a routine as well, he drinks 2 cups of coffee in the morning, very rarely any daytime caffeine especially not in the form of soda. Every other day he will get a daytime nap of 30 minutes duration. A power nap. And he feels that this is refreshing. He is using a nasal CPAP mask interface. This has been described as comfortable or at least not bothersome to him. He has 2 nocturias at night. No trauma to neck or face, no tonsllectomy or nasal surgery. No TBI.  We were able today to get a compliance report from Nicholas Robbins's  CPAP machine and it shows 100% compliance. He has used the machine on average 7 hours and 57 minutes at night he uses a ramp start at 4 cm water and a CPAP pressure of 5.5 cm water. His overall AHI also known as the apnea hypotony index was not elicited by this reading. There has also been no alarm set for air leakage. He has a C-Flex setting of 2 cm water.   10-09-14 : Continues to work at [employer], [address]. He uses CPAP faithfully- 100% over 4 hours, residual AHI 2 at 5.5 cm water, very low.   04-24-15: I reviewed today the CPAP download including the days of 12-20 6-16. The patient again has 100% compliance and a correlative usage of over 8 days and 22 hours. Average daily usage is 7 hours and 9 minutes. The patient is highly compliant but his residual AHI is not visible-  He is on a C-Flex setting of 2 cm water. He sleeps on average about 7 hours at night, going to bed at 10:30 and waking up at 5:30. He falls asleep promptly.   I have the pleasure of seeing Nicholas Robbins today, for a routine compliance visit in our CPAP clinic. The patient continues to use CPAP at 100% compliance with 7 hours and 8 minutes on average nightly use, his CPAP setting is at only 5.5 cm. His residual AHI is again  not visible. The patient is using a REMstar plus machine. He was already  Diagnosed 7 or 8 years ago and this is his original machine. It would not satisfy today standard of therapeutic data collection. The patient reports some daytime sleepiness at 12 out of 24 points and fatigue at 31 out of a possible 53 points. He does not endorse depression. My goal would be to retest Nicholas Robbins see if he still has apnea since he is doing well on a very low pressure. He has lost some weight. If his AHI is higher than 8 I would continue treatment with CPAP.    Review of Systems: Epworth sleepiness score is endorsed at  13 from 15 points and fatigue severity at  33 from 26 points.  Symptoms of depression : not   Present.  Out of a complete 14 system review, the patient complains of only the following symptoms, and all other reviewed systems are negative. Single, master's degree , religous studies. 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  Schedules naps, has driving sleepiness problems.  Modafinil.      Family History  Problem Relation Age of Onset  . Ulcers Nicholas 57       bleeding/ deceased  . Lymphoma Brother   . Breast cancer Sister   . Colon cancer Neg Hx   . Stomach cancer Neg Hx   . Rectal cancer Neg Hx   . Esophageal cancer Neg Hx   . Liver cancer Neg Hx     Past Medical History:  Diagnosis Date  . Arthritis   . BPH (benign prostatic hyperplasia)   . Colon polyps   . Diverticulitis   . Glaucoma   . Hypertension     Past Surgical History:  Procedure Laterality Date  . broken ankle  1961  . HERNIA REPAIR    . REPLACEMENT TOTAL KNEE  2005  . VITRECTOMY Left 2006    Current Outpatient Prescriptions  Medication Sig Dispense Refill  . aspirin 81 MG tablet Take 81 mg by mouth daily.    Marland Kitchen BENICAR 20 MG tablet Take 20 mg by mouth daily.  3  . carvedilol (COREG) 6.25 MG tablet Take 6.25 mg by mouth 2 (two) times daily.  2  . COD LIVER OIL PO Take by mouth daily at 2 PM. One teaspoon    . fexofenadine (ALLEGRA) 180 MG tablet Take 180 mg by mouth daily.    . finasteride (PROSCAR) 5 MG tablet TAKE 1 TABLET DAILY AS DIRECTED.  3  . fluocinonide (LIDEX) 0.05 % external solution Apply 1 application topically 2 (two) times daily. Instil 2 gtts in ear twice weekly    . folic acid (FOLVITE) 800 MCG tablet Take 400 mcg by mouth daily.    . Garlic 1000 MG CAPS Take 1,000 mg by mouth 4 (four) times daily.    Marland Kitchen glucosamine-chondroitin 500-400 MG tablet Take 1 tablet by mouth 2 (two) times daily.    Marland Kitchen GLUCOSAMINE-CHONDROITIN-MSM-D3 PO Take by mouth 2 (two) times daily.    . hyoscyamine (LEVSIN SL) 0.125 MG SL tablet Place 0.125 mg under the tongue every 8 (eight) hours as needed for cramping.    .  modafinil (PROVIGIL) 100 MG tablet Take 1 tablet (100 mg total) by mouth daily. 30 tablet 1  . Multiple Vitamin (MULTIVITAMIN) tablet Take 1 tablet by mouth daily. Shaklee vitamin w/o iron    . Multiple Vitamins-Minerals (PRESERVISION AREDS PO) Take by mouth 2 (two) times daily.    . NON FORMULARY  Shaklee Vita Lea w/o Iron 2 daily    . NON FORMULARY Juice Plus Orchard Blend  2 daily and Garden Blend 2 daily    . pravastatin (PRAVACHOL) 40 MG tablet Take 40 mg by mouth at bedtime.  2  . Probiotic Product (ALIGN PO) Take by mouth.    . silodosin (RAPAFLO) 8 MG CAPS capsule Take 8 mg by mouth daily with breakfast.    . Travoprost, BAK Free, (TRAVATAN) 0.004 % SOLN ophthalmic solution Place 1 drop into both eyes at bedtime.    . triamcinolone (NASACORT ALLERGY 24HR) 55 MCG/ACT AERO nasal inhaler Place 2 sprays into the nose daily.    Marland Kitchen. UNABLE TO FIND Take 1 capsule by mouth 4 (four) times daily. Med Name: Juiceplus    . UNABLE TO FIND Med Name: cod liver oil  1 tsp qday    . vitamin B-12 (CYANOCOBALAMIN) 1000 MCG tablet Take 1,000 mcg by mouth daily.     No current facility-administered medications for this visit.     Allergies as of 11/03/2016  . (No Known Allergies)    Vitals: BP 115/61   Pulse 67   Ht 5\' 8"  (1.727 m)   Wt 189 lb (85.7 kg)   BMI 28.74 kg/m  Last Weight:  Wt Readings from Last 1 Encounters:  11/03/16 189 lb (85.7 kg)       Last Height:   Ht Readings from Last 1 Encounters:  11/03/16 5\' 8"  (1.727 m)    Physical exam:  General: The patient is awake, alert and appears not in acute distress. The patient is well groomed. Head: Normocephalic, atraumatic. Neck is supple. Mallampati 3.  neck circumference: 16. Nasal airflow congested, TMJ is by click on the right jaw evident . Retrognathia is not seen.  Cardiovascular:  Regular rate and rhythm, without  murmurs or carotid bruit, and without distended neck veins. Respiratory: Lungs are clear to auscultation. Skin:   Without evidence of edema, or rash Trunk: scoliosis  , lumbar fusion, hip pain.  Neurologic exam : The patient is awake and alert, oriented to place and time.   Memory subjective described as intact. There is a normal attention span & concentration ability. Speech is fluent without  dysarthria, dysphonia or aphasia.  Mood and affect are appropriate.  Cranial nerves: Pupils are equal and unreactive to light. Funduscopic exam with evidence of a air-bubble,bilaterally  cataract related lens replacement   , no  nystagmus. Visual fields by finger perimetry are intact. Hearing to finger rub intact. Facial sensation intact to fine touch. Facial motor strength is symmetric and tongue and uvula move midline.   Assessment:  After physical and neurologic examination, review of laboratory studies, imaging, neurophysiology testing and pre-existing records, assessment is :  OSA diagnosed 2010 [place] - degree of apnea not known, he was told he was " severe " . My download in- office did  Again not elicit an AHI.  Doing well on 5.5 cm indicates he is not a severe apnea case and his loss of weight may alleviate the need for CPAP at all.   The patient was advised of the nature of the diagnosed sleep disorder , the treatment options and risks for general a health and wellness arising from not treating the condition. Visit duration was 10 minutes.  More than 50% of the time were reserved for information, discussion about OSA and the treatment option.    Plan:  Treatment plan and additional workup : Patient with likely moderate  to severe OSA by history, compliant 100% on CPAP.  RAMP time 8 minutes. Rhinitis treated with Nasocort, but little reduction. Try Zyrtec daily.  I would like to repeat a sleep study to establish his current AHI- and leave him on CPAP if his AHI is still 8 or higher.  Nocturia still only once at night.  He is receiving steroid injections for back pain, and right hip pain- Dr. Danielle Dess  sees him. obtained an MRI 2-3-4-5 lumbar pain, status post fusion in [place],  [place].  Rv with CPAP yearly. He had complaint of excessive daytime sleepiness, I suggested modafinil 100 mg tab. I like to obtain a residual AHI - please have DME send a special download. [facility].   Nicholas Mylar Dajane Valli MD  11/03/2016

## 2016-11-03 NOTE — Patient Instructions (Signed)

## 2016-11-18 ENCOUNTER — Ambulatory Visit (INDEPENDENT_AMBULATORY_CARE_PROVIDER_SITE_OTHER): Payer: Medicare Other | Admitting: Neurology

## 2016-11-18 DIAGNOSIS — G4733 Obstructive sleep apnea (adult) (pediatric): Secondary | ICD-10-CM

## 2016-11-18 DIAGNOSIS — Z9989 Dependence on other enabling machines and devices: Secondary | ICD-10-CM

## 2016-11-18 DIAGNOSIS — G4761 Periodic limb movement disorder: Secondary | ICD-10-CM

## 2016-11-18 DIAGNOSIS — G4734 Idiopathic sleep related nonobstructive alveolar hypoventilation: Principal | ICD-10-CM

## 2016-11-22 NOTE — Procedures (Signed)
PATIENT'S NAME:  Nicholas Robbins, Nicholas Robbins DOB:      April 08, 1929      MR#:    [id number]     DATE OF RECORDING: 11/18/2016 REFERRING M.D.:  Rodrigo RanMark Perini, MD Study Performed:  Split-Night Titration Study HISTORY:   [name],a [name]ar and [employer] from [place], and had been diagnosed with OSA there. He needs to establish himself with a new DME and sleep clinic, and to evaluate his need for CPAP.  He received his diagnosis of obstructive sleep apnea approximately 5 years ago and has used CPAP nightly. He carries the diagnosis of hypertension, has benign prostate hyperplasia, and  coronary arthrosclerosis was diagnosed. Dilated pulmonary arteries found in January 2015, possibly related to a pulmonary embolism.  He is using a nasal CPAP mask,has 2 times nocturia at night.  Father Loth's CPAP machine's date show 100% compliance. He has used the machine on average 7 hours and 57 minutes at night he uses a ramp start at 4 cm water and a CPAP pressure of 5.5 cm water. He has a C-Flex setting of 2 cm water. The patient endorsed the Epworth Sleepiness Scale at 12/24 points   The patient's weight 189 pounds with a height of 68 (inches), resulting in a BMI of 28.7 kg/m2. The patient's neck circumference measured 16 inches.  CURRENT MEDICATIONS: Aspirin, Benicar, Carvedilol, Fexofenadine, Finasteride, Fluocinonide, Folic acid, Glucosamine, Hyoscyamine, Modafinil, Multi-Vitamin, Pravastatin, Travoprost eye drops, Triamcinolone ,B-12,  PROCEDURE:  This is a multichannel digital polysomnogram utilizing the SomnoStar 11.2 system.  Electrodes and sensors were applied and monitored per AASM Specifications.   EEG, EOG, Chin and Limb EMG, were sampled at 200 Hz.  ECG, Snore and Nasal Pressure, Thermal Airflow, Respiratory Effort, CPAP Flow and Pressure, Oximetry was sampled at 50 Hz. Digital video and audio were recorded.       BASELINE STUDY WITHOUT CPAP RESULTS: Lights Out was at 21:19 and Lights On at  05:20.  Total recording time (TRT) was 265.5, with a total sleep time (TST) of 124.5 minutes.   The patient's sleep latency was 106 minutes.  REM latency was 187 minutes.  The sleep efficiency was poor at 46.9 %.    SLEEP ARCHITECTURE: WASO (Wake after sleep onset) was 78 minutes, Stage N1 was 14 minutes, Stage N2 was 95 minutes, Stage N3 was 0 minutes and Stage R (REM sleep) was 15.5 minutes.  The percentages were Stage N1 11.2%, Stage N2 76.3%, Stage N3 0% and Stage R (REM sleep) 12.4%.   RESPIRATORY ANALYSIS:  There were a total of 99 respiratory events:  78 obstructive apneas, 2 central apneas and 19 hypopneas with 0 respiratory event related arousals (RERAs).     The total APNEA/HYPOPNEA INDEX (AHI) was 47.7 /hour and the total RESPIRATORY DISTURBANCE INDEX was 47.7 /hour.  10 events occurred in REM sleep and 28 events in NREM. The REM AHI was 38.7, /hour versus a non-REM AHI of 49. /hour. The patient spent 274.5 minutes sleep time in the supine position 0 minutes in non-supine. The supine AHI was 47.8 /hour versus a non-supine AHI of 0.0 /hour.  OXYGEN SATURATION & C02:  The wake baseline 02 saturation was 98%, with the lowest being 73%. Time spent below 89% saturation equaled 65 minutes.  PERIODIC LIMB MOVEMENTS:   The patient had a total of 57 Periodic Limb Movements.  The Periodic Limb Movement (PLM) index was 27.5 /hour and the PLM Arousal index was 2.9 /hour. The arousals were noted as:  1 spontaneous, 6 associated with PLMs, and 99 were associated with respiratory events.   Audio and video analysis did not show any abnormal or unusual movements, behaviors, phonations or vocalizations. The patient took 2 bathroom breaks. Snoring was noted EKG was in keeping with normal sinus rhythm (NSR)  TITRATION STUDY WITH CPAP RESULTS:   CPAP was initiated at 5 cmH20 with heated humidity per AASM split night standards and pressure was advanced to 15/15 cmH20 because of hypopneas, apneas and  desaturations.  At a PAP pressure of 15 cmH20, there was a reduction of the AHI to 0.0 and increase in Nadir to 90%.   Total recording time (TRT) was 215.5 minutes, with a total sleep time (TST) of 150 minutes. The patient's sleep latency was now only 7 minutes. REM latency was 167.5 minutes.  The sleep efficiency was 70.0 %.    SLEEP ARCHITECTURE: Wake after sleep was 62 minutes, Stage N1 25 minutes, Stage N2 119 minutes, Stage N3 0 minutes and Stage R (REM sleep) 6 minutes. The percentages were: Stage N1 16.7%, Stage N2 79.3%, Stage N3 0% and Stage R (REM sleep) 4.%. The sleep architecture was notable for  The arousals were noted as: 12 were spontaneous, 2 were associated with PLMs, and 88 were associated with respiratory events.  RESPIRATORY ANALYSIS:  There were a total of 88 respiratory events: 0 obstructive apneas, 0 central apneas and 0 mixed apneas with a total of 0 apneas and an apnea index (AI) of 0. There were 88 hypopneas with a hypopnea index of 35.2 /hour. The patient also had 0 respiratory event related arousals (RERAs).      The total APNEA/HYPOPNEA INDEX (AHI) was 35.2 /hour and the total RESPIRATORY DISTURBANCE INDEX was 35.2 /hour.  2 events occurred in REM sleep and 86 events in NREM. The REM AHI was 20 /hour versus a non-REM AHI of 35.8 /hour. The patient spent 100% of total sleep time in the supine position. The supine AHI was 35.2 /hour, versus a non-supine AHI of 0.0/hour.  OXYGEN SATURATION & C02:  The wake baseline 02 saturation was 94%, with the lowest being 76%. Time spent below 89% saturation equaled 21 minutes.  PERIODIC LIMB MOVEMENTS:   The patient had a total of 16 Periodic Limb Movements. The Periodic Limb Movement (PLM) index was 6.4 /hour and the PLM Arousal index was 0.8 /hour.       POLYSOMNOGRAPHY IMPRESSION :   1. Severe Obstructive Sleep Apnea (OSA) at an AHI of 47.9, associated with hypoxemia and very low SpO2 Nadir.  2. Snoring 3. Mild Periodic Limb  Movement Disorder (PLMD) 4. Severely fragmented sleep until CPAP titration was initiated and exceeded 14 cm water pressure.   RECOMMENDATIONS:  1. CPAP at 15 cm water had best results, but may still not be optimal . The patient will be prescribed auto - CPAP 6 through 16 cm water pressure, F20 FFM in medium size, heated humidification. 2. PLMs were mild under CPAP and seemed to be alleviated at final pressure.  3. Further information regarding OSA may be obtained from [employer] (www.sleepfoundation.org) or American Sleep Apnea Association (www.sleepapnea.org). 4. A follow up appointment will be scheduled in the Sleep Clinic at [facility].      I certify that I have reviewed the entire raw data recording prior to the issuance of this report in accordance with the Standards of Accreditation of the American Academy of Sleep Medicine (AASM)      Porfirio Mylar Koree Staheli,  M.D.    11-21-2016  Diplomat, American Board of Psychiatry and Neurology  Diplomat, American Board of Sleep Medicine

## 2016-11-22 NOTE — Addendum Note (Signed)
Addended by: Melvyn NovasHMEIER, Mathhew Buysse on: 11/22/2016 05:26 PM   Modules accepted: Orders

## 2016-11-25 ENCOUNTER — Telehealth: Payer: Self-pay | Admitting: Neurology

## 2016-11-25 NOTE — Telephone Encounter (Signed)
-----   Message from Melvyn Novasarmen Dohmeier, MD sent at 11/22/2016  5:24 PM EDT ----- POLYSOMNOGRAPHY IMPRESSION :   Severe Obstructive Sleep Apnea (OSA) at an AHI of 47.9, associated with hypoxemia and very low SpO2 Nadir.  Snoring Mild Periodic Limb Movement Disorder (PLMD) Severely fragmented sleep until CPAP titration was initiated and exceeded 14 cm water pressure.   RECOMMENDATIONS:  CPAP at 15 cm water had best results, but may still not be optimal . The patient will be prescribed auto - CPAP 6 through 16 cm water pressure, F20 FFM in medium size, heated humidification. PLMs were mild under CPAP and seemed to be alleviated at final pressure.  Further information regarding OSA may be obtained from [employer] (www.sleepfoundation.org) or American Sleep Apnea Association (www.sleepapnea.org). A follow up appointment will be scheduled in the Sleep Clinic at [facility].

## 2016-11-25 NOTE — Telephone Encounter (Signed)
Patient returning your call.

## 2016-11-25 NOTE — Telephone Encounter (Addendum)
Called and spoke with the pt about his sleep study results. Pt has been a compliant CPAP user and was aware of the sleep apnea he had. Dr Vickey Hugerohmeier ordered his CPAP for him and the patient is drawing a blank on the company he uses. He would like to call back and give me the name and number of the DME company that he would like me to send this order to. Pt verbalized understanding of the sleep study results and had no further questions.

## 2016-11-25 NOTE — Telephone Encounter (Signed)
Called to discuss sleep study results. Pt wasn't available to come to phone. I left a message for pt to call back.

## 2016-12-26 ENCOUNTER — Encounter: Payer: Self-pay | Admitting: Neurology

## 2016-12-26 ENCOUNTER — Telehealth: Payer: Self-pay | Admitting: Neurology

## 2016-12-26 DIAGNOSIS — G4733 Obstructive sleep apnea (adult) (pediatric): Secondary | ICD-10-CM

## 2016-12-26 DIAGNOSIS — Z9989 Dependence on other enabling machines and devices: Secondary | ICD-10-CM

## 2016-12-26 NOTE — Telephone Encounter (Addendum)
Nicholas Robbins, please get another download for me to compare air leak data. I will respond more specific ( orders for DME ) on Tuesday .  I called Nicholas Robbins and let him know that changes wil not be made before tuesdaty and he was fine with that. He stated " I miss my old machine " CPAP that was set to 5.5 cm water .   I plan on reducing his pressure for his comfort. CD

## 2016-12-26 NOTE — Telephone Encounter (Signed)
Patient called office in reference to CPAP machine states the air is blowing out of his mouth due to the pressure being to strong and opening his mouth.  CPAP pressure begins with a 6 and would like to know if the pressure is able to go any lower.  Please call.

## 2016-12-26 NOTE — Addendum Note (Signed)
Addended by: Melvyn NovasHMEIER, Bricia Taher on: 12/26/2016 12:30 PM   Modules accepted: Orders

## 2016-12-31 ENCOUNTER — Other Ambulatory Visit: Payer: Self-pay | Admitting: Neurology

## 2016-12-31 DIAGNOSIS — G4733 Obstructive sleep apnea (adult) (pediatric): Secondary | ICD-10-CM

## 2016-12-31 NOTE — Telephone Encounter (Signed)
I thought he would not get changes in pressure before the weekend. But I like for [employer] to do it now. The airleak is horrible- can we fit him soon? CD

## 2016-12-31 NOTE — Telephone Encounter (Signed)
[facility] which is who I have on file for him. I have also asked them about the mask fitting. There has been no response as of yet.

## 2016-12-31 NOTE — Telephone Encounter (Signed)
Do you know which DME company you are sending oder to? Can you get them to do a mask fitting also?

## 2016-12-31 NOTE — Telephone Encounter (Signed)
I reviewed the download. Settings have not been changed. Still on auto 6-16 He has used the the unit the last 8days. His leak is 39.5. Maybe Dme company will do another mask fitting. If not I can get him in here if you want.

## 2017-02-09 ENCOUNTER — Ambulatory Visit (INDEPENDENT_AMBULATORY_CARE_PROVIDER_SITE_OTHER): Payer: Medicare Other | Admitting: Neurology

## 2017-02-09 ENCOUNTER — Encounter: Payer: Self-pay | Admitting: Neurology

## 2017-02-09 ENCOUNTER — Encounter (INDEPENDENT_AMBULATORY_CARE_PROVIDER_SITE_OTHER): Payer: Self-pay

## 2017-02-09 VITALS — BP 111/66 | HR 64 | Ht 68.0 in | Wt 190.0 lb

## 2017-02-09 DIAGNOSIS — Z9989 Dependence on other enabling machines and devices: Secondary | ICD-10-CM | POA: Diagnosis not present

## 2017-02-09 DIAGNOSIS — G4733 Obstructive sleep apnea (adult) (pediatric): Secondary | ICD-10-CM

## 2017-02-09 DIAGNOSIS — R251 Tremor, unspecified: Secondary | ICD-10-CM

## 2017-02-09 NOTE — Progress Notes (Signed)
SLEEP MEDICINE CLINIC   Provider:  Melvyn Robbins, M D  Referring Provider: Rodrigo Ran, MD Primary Care Physician:  Nicholas Ran, MD  Chief Complaint  Patient presents with  . Follow-up    pt present alone, rm 10. pt was without power for 2 days which accounts for 2 days missed on his report. DME [facility].     HPI:  Nicholas Robbins is a 83 y.o. male  seen here as a referral from Dr. 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 He needs to establish himself with a new DME and sleep clinic. He received his diagnosis of obstructive sleep apnea approximately 5 years ago and has used CPAP nightly. He carries the diagnosis of hypertension and has been treated with medications since 2000 he has benign prostate hyperplasia, and a coronary arthrosclerosis was diagnosed by chest CT in generally 2015. Dilated pulmonary arteries in generally 15 suspect that this is remote pulmonary embolism. And he had whooping cough as a child. His usual bedtime is 10:30 PM, he will promptly go to sleep within 5-10 minutes. He lives alone and there is no report of snoring but he does not feel that there is any effect of this being present. He wakes up usually restored or refreshed and has the most energy in the morning. His overall nocturnal sleep time is more than 6 hours and he rises at 5:30 AM regularly. He sets breakfasts for the guess that the pain center the morning has this as a routine as well, he drinks 2 cups of coffee in the morning, very rarely any daytime caffeine especially not in the form of soda. Every other day he will get a daytime nap of 30 minutes duration. A power nap. And he feels that this is refreshing. He is using a nasal CPAP mask interface. This has been described as comfortable or at least not bothersome to him. He has 2 nocturias at night. No trauma to neck or  face, no tonsllectomy or nasal surgery. No TBI.  We were able today to get a compliance report from Nicholas Robbins's CPAP machine and it shows 100% compliance. He has used the machine on average 7 hours and 57 minutes at night he uses a ramp start at 4 cm water and a CPAP pressure of 5.5 cm water. His overall AHI also known as the apnea hypotony index was not elicited by this reading. There has also been no alarm set for air leakage. He has a C-Flex setting of 2 cm water.   10-09-14 : Continues to work at [employer], [address]. He uses CPAP faithfully- 100% over 4 hours, residual AHI 2 at 5.5 cm water, very low.   04-24-15: I reviewed today the CPAP download including the days of 12-20 6-16. The patient again has 100% compliance and a correlative usage of over 8 days and 22 hours. Average daily usage is 7 hours and 9 minutes. The patient is highly compliant but his residual AHI is not visible-  He is on a C-Flex setting of 2 cm water. He sleeps on average about 7 hours at night, going to bed at 10:30 and waking up at 5:30. He falls asleep promptly.  2018-I have the pleasure of seeing Nicholas Robbins today, for a routine compliance visit in our CPAP clinic. The patient continues to use CPAP at  100% compliance with 7 hours and 8 minutes on average nightly use, his CPAP setting is at only 5.5 cm. His residual AHI is again not visible. The patient is using a REMstar plus machine. He was already  Diagnosed 7 or 8 years ago and this is his original machine. It would not satisfy today standard of therapeutic data collection. The patient reports some daytime sleepiness at 12 out of 24 points and fatigue at 31 out of a possible 53 points. He does not endorse depression. My goal would be to retest Nicholas Robbins see if he still has apnea since he is doing well on a very low pressure. He has lost some weight. If his AHI is higher than 8 I would continue treatment with CPAP.   I see Nicholas Robbins today 02-09-2017,  following a sleep study from 11/18/2016. The patient had developed very high air leaks and increasing residual apnea indices prior to the 3 titration, the study confirmed the presence of severe sleep apnea with an AHI of 47.7 per hour, not REM sleep accentuated, all sleep in supine, the patient had also 65 minutes of total desaturation time below 89%, he had very few PLMS, nearly all arousals from sleep were related to apnea. The patient was titrated to CPAP ASV is minimum pressure is 5.6 cm water maximum pressure 16 cm water was 1 cm EPR the 91st percentile pressure is only 8.8 cm water but reduced his AHI now to 2 per hour, his pure apnea index is only 0.3 apneas per hour. The airleak however remained high. He shows excellent compliance 93% of the time due to power outage this  is not 100% He reports sleeping well.    Review of Systems: Epworth sleepiness score is endorsed at  13 from 15 points and fatigue severity at  33 from 26 points.  Knee pain, left hand tremor- exacerbated at action, patient is left handed. He has lost vocal volume, hoarseness is present. He has no lost facial expression.   Out of a complete 14 system review, the patient complains of only the following symptoms, and all other reviewed systems are negative. Single, master's degree , religous studies. 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 Schedules naps, has driving sleepiness problems.  Modafinil.      Family History  Problem Relation Age of Onset  . Ulcers Nicholas 13       bleeding/ deceased  . Lymphoma Brother   . Breast cancer Sister   . Colon cancer Neg Hx   . Stomach cancer Neg Hx   . Rectal cancer Neg Hx   . Esophageal cancer Neg Hx   . Liver cancer Neg Hx     Past Medical History:  Diagnosis Date  . Arthritis   . BPH (benign prostatic hyperplasia)   . Colon polyps   . Diverticulitis   . Glaucoma   . Hypertension     Past Surgical History:  Procedure Laterality Date  . broken ankle  1961  . HERNIA REPAIR    .  REPLACEMENT TOTAL KNEE  2005  . VITRECTOMY Left 2006    Current Outpatient Prescriptions  Medication Sig Dispense Refill  . aspirin 81 MG tablet Take 81 mg by mouth daily.    Marland Kitchen BENICAR 20 MG tablet Take 20 mg by mouth daily.  3  . carvedilol (COREG) 6.25 MG tablet Take 6.25 mg by mouth 2 (two) times daily.  2  . COD LIVER OIL PO Take by mouth daily at 2 PM. One  teaspoon    . fexofenadine (ALLEGRA) 180 MG tablet Take 180 mg by mouth daily.    . finasteride (PROSCAR) 5 MG tablet TAKE 1 TABLET DAILY AS DIRECTED.  3  . fluocinonide (LIDEX) 0.05 % external solution Apply 1 application topically 2 (two) times daily. Instil 2 gtts in ear twice weekly    . folic acid (FOLVITE) 800 MCG tablet Take 400 mcg by mouth daily.    . Garlic 1000 MG CAPS Take 1,000 mg by mouth 4 (four) times daily.    Marland Kitchen glucosamine-chondroitin 500-400 MG tablet Take 1 tablet by mouth 2 (two) times daily.    . hyoscyamine (LEVSIN SL) 0.125 MG SL tablet Place 0.125 mg under the tongue every 8 (eight) hours as needed for cramping.    . Multiple Vitamin (MULTIVITAMIN) tablet Take 1 tablet by mouth daily. Shaklee vitamin w/o iron    . Multiple Vitamins-Minerals (PRESERVISION AREDS PO) Take by mouth 2 (two) times daily.    . NON FORMULARY Shaklee Vita Lea w/o Iron 2 daily    . NON FORMULARY Juice Plus Orchard Blend  2 daily and Garden Blend 2 daily    . pravastatin (PRAVACHOL) 40 MG tablet Take 40 mg by mouth at bedtime.  2  . Probiotic Product (ALIGN PO) Take by mouth.    . silodosin (RAPAFLO) 8 MG CAPS capsule Take 8 mg by mouth daily with breakfast.    . Travoprost, BAK Free, (TRAVATAN) 0.004 % SOLN ophthalmic solution Place 1 drop into both eyes at bedtime.    . triamcinolone (NASACORT ALLERGY 24HR) 55 MCG/ACT AERO nasal inhaler Place 2 sprays into the nose daily.    Marland Kitchen UNABLE TO FIND Take 1 capsule by mouth 4 (four) times daily. Med Name: Juiceplus    . UNABLE TO FIND Med Name: cod liver oil  1 tsp qday    . vitamin B-12  (CYANOCOBALAMIN) 1000 MCG tablet Take 1,000 mcg by mouth daily.    . modafinil (PROVIGIL) 100 MG tablet Take 1 tablet (100 mg total) by mouth daily. (Patient not taking: Reported on 02/09/2017) 30 tablet 1   No current facility-administered medications for this visit.     Allergies as of 02/09/2017  . (No Known Allergies)    Vitals: BP 111/66   Pulse 64   Ht  (1.727 m)   Wt 190 lb (86.2 kg)   BMI 28.89 kg/m  Last Weight:  Wt Readings from Last 1 Encounters:  02/09/17 190 lb (86.2 kg)       Last Height:   Ht Readings from Last 1 Encounters:  02/09/17  (1.727 m)    Physical exam:  General: The patient is awake, alert and appears not in acute distress. The patient is well groomed. Head: Normocephalic, atraumatic. Neck is supple. Mallampati 3.  neck circumference: 16. Nasal airflow congested, TMJ is by click on the right jaw evident . Retrognathia is not seen.  Cardiovascular:  Regular rate and rhythm, without  murmurs or carotid bruit, and without distended neck veins. Respiratory: Lungs are clear to auscultation.Skin:  Without evidence of edema, or rash. Trunk: scoliosis  , lumbar fusion, hip pain.  Neurologic exam :The patient is awake and alert, oriented to place and time.   Memory subjective described as intact. There is a normal attention span & concentration ability. Speech is fluent without  dysarthria, dysphonia or aphasia.  Mood and affect are appropriate.  Cranial nerves: Pupils are equal and unreactive to light. Bilaterally Cataract related lens  replacement, no nystagmus. Visual fields by finger perimetry are intact. Hearing to finger rub intact. Facial sensation intact to fine touch. Facial motor strength is symmetric, and tongue and uvula move midline.   The patient is left handed. No tremor visible now. He has a pronounced pronator drift. The left upper extremity, on coordination testing there was no trauma, no ataxia nor dysmetria noted. He has a slight  pill rolling tremor at rest in his left thumb, there is mild cog-wheeling at the wrist and elbow - not at the biceps.   Assessment:  After physical and neurologic examination, review of laboratory studies, imaging, neurophysiology testing and pre-existing records, assessment is :  1) complex apnea - OSA diagnosed 2010 in [place] - degree of apnea not known, he was told he was " severe " . Confirmed severity is SPLIT study -Patient uses auto PAP and has reached reduction to AHI of 2.0.and has continued to expreience high air leaks.   2) The patient has been concerned about a tremor in his left , dominant hand. This could be related to a severe shoulder pain following a presumed rotator cuff injury in 2/ 2018/ this could be a parkinsonian manifestation. I will follow him in 6 month for the tremor. I like to add his voice is more dysphonic and he has no RLS or REM BD.    My patient  was advised of the nature of the diagnosed sleep disorder, the treatment options and risks for general a health and wellness arising from not treating the condition.   Visit duration was 30 minutes. More than 50% of the time were reserved for information, discussion about OSA and the treatment option.   Nicholas Robbins Anaiz Qazi MD  02/09/2017    Dr Nicholas Robbins.

## 2017-02-09 NOTE — Patient Instructions (Signed)
Tremor A tremor is trembling or shaking that you cannot control. Most tremors affect the hands or arms. Tremors can also affect the head, vocal cords, face, and other parts of the body. There are many types of tremors. Common types include:  Essential tremor. These usually occur in people over the age of 40. It may run in families and can happen in otherwise healthy people.  Resting tremor. These occur when the muscles are at rest, such as when your hands are resting in your lap. People with Parkinson disease often have resting tremors.  Postural tremor. These occur when you try to hold a pose, such as keeping your hands outstretched.  Kinetic tremor. These occur during purposeful movement, such as trying to touch a finger to your nose.  Task-specific tremor. These may occur when you perform tasks such as handwriting, speaking, or standing.  Psychogenic tremor. These dramatically lessen or disappear when you are distracted. They can happen in people of all ages.  Some types of tremors have no known cause. Tremors can also be a symptom of nervous system problems (neurological disorders) that may occur with aging. Some tremors go away with treatment while others do not. Follow these instructions at home: Watch your tremor for any changes. The following actions may help to lessen any discomfort you are feeling:  Take medicines only as directed by your health care provider.  Limit alcohol intake to no more than 1 drink per day for nonpregnant women and 2 drinks per day for men. One drink equals 12 oz of beer, 5 oz of wine, or 1 oz of hard liquor.  Do not use any tobacco products, including cigarettes, chewing tobacco, or electronic cigarettes. If you need help quitting, ask your health care provider.  Avoid extreme heat or cold.  Limit the amount of caffeine you consumeas directed by your health care provider.  Try to get 8 hours of sleep each night.  Find ways to manage your stress,  such as meditation or yoga.  Keep all follow-up visits as directed by your health care provider. This is important.  Contact a health care provider if:  You start having a tremor after starting a new medicine.  You have tremor with other symptoms such as: ? Numbness. ? Tingling. ? Pain. ? Weakness.  Your tremor gets worse.  Your tremor interferes with your day-to-day life. This information is not intended to replace advice given to you by your health care provider. Make sure you discuss any questions you have with your health care provider. Document Released: 04/04/2002 Document Revised: 12/16/2015 Document Reviewed: 10/10/2013 Elsevier Interactive Patient Education  2018 Elsevier Inc.  

## 2017-04-20 ENCOUNTER — Ambulatory Visit (HOSPITAL_COMMUNITY)
Admission: RE | Admit: 2017-04-20 | Discharge: 2017-04-20 | Disposition: A | Discharge status: Home / Self Care | Payer: Medicare Other | Source: Ambulatory Visit | Attending: Internal Medicine | Admitting: Internal Medicine

## 2017-04-20 ENCOUNTER — Other Ambulatory Visit (HOSPITAL_COMMUNITY): Payer: Self-pay | Admitting: Internal Medicine

## 2017-04-20 DIAGNOSIS — M7989 Other specified soft tissue disorders: Secondary | ICD-10-CM | POA: Insufficient documentation

## 2017-04-20 DIAGNOSIS — M79604 Pain in right leg: Secondary | ICD-10-CM | POA: Diagnosis not present

## 2017-04-20 NOTE — Progress Notes (Signed)
Preliminary results by tech - Right Lower Ext. Venous Duplex Completed. Negative for deep and superficial vein thrombosis. I tried calling the office to give results, the office closed at noon today. Patient was given results.  Marilynne Halstedita Aamani Moose, BS, RDMS, RVT

## 2017-07-08 ENCOUNTER — Encounter: Payer: Self-pay | Admitting: Neurology

## 2017-07-13 ENCOUNTER — Encounter: Payer: Self-pay | Admitting: Neurology

## 2017-07-13 ENCOUNTER — Ambulatory Visit: Payer: Medicare Other | Admitting: Neurology

## 2017-07-13 ENCOUNTER — Encounter (INDEPENDENT_AMBULATORY_CARE_PROVIDER_SITE_OTHER): Payer: Self-pay

## 2017-07-13 DIAGNOSIS — G4733 Obstructive sleep apnea (adult) (pediatric): Secondary | ICD-10-CM

## 2017-07-13 DIAGNOSIS — Z9989 Dependence on other enabling machines and devices: Secondary | ICD-10-CM

## 2017-07-13 DIAGNOSIS — G4719 Other hypersomnia: Secondary | ICD-10-CM | POA: Diagnosis not present

## 2017-07-13 MED ORDER — CARBIDOPA-LEVODOPA 25-100 MG PO TABS: 1.0000 | ORAL_TABLET | Freq: Three times a day (TID) | ORAL | 5 refills | Status: DC

## 2017-07-13 MED ORDER — MODAFINIL 100 MG PO TABS: 100.0000 mg | ORAL_TABLET | Freq: Every day | ORAL | 1 refills | Status: DC

## 2017-07-13 MED ORDER — MODAFINIL 100 MG PO TABS: 100.0000 mg | ORAL_TABLET | Freq: Every day | ORAL | 5 refills | Status: DC

## 2017-07-13 NOTE — Progress Notes (Signed)
SLEEP MEDICINE CLINIC   Provider:  Melvyn Robbins, M D  Referring Provider: Rodrigo Ran, MD Primary Care Physician:  Nicholas Ran, MD  Chief Complaint  Patient presents with  . OSA on Robbins    " no problems with Robbins"  . Follow-up    HPI:  Nicholas Robbins is a 84 y.o. male  seen here as a referral from Dr. Waynard Robbins for a sleep evaluation,   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 He needs to establish himself with a new DME and sleep clinic.  He received his diagnosis of obstructive sleep apnea approximately 5 years ago and has used Robbins nightly. He carries the diagnosis of hypertension and has been treated with medications since 2000 he has benign prostate hyperplasia, and a coronary arthrosclerosis was diagnosed by chest CT in generally 2015. Dilated pulmonary arteries in generally 15 suspect that this is remote pulmonary embolism. And he had whooping cough as a child. His usual bedtime is 10:30 PM, he will promptly go to sleep within 5-10 minutes. He lives alone and there is no report of snoring but he does not feel that there is any effect of this being present. He wakes up usually restored or refreshed and has the most energy in the morning. His overall nocturnal sleep time is more than 6 hours and he rises at 5:30 AM regularly. He sets breakfasts for the guess that the pain center the morning has this as a routine as well, he drinks 2 cups of coffee in the morning, very rarely any daytime caffeine especially not in the form of soda. Every other day he will get a daytime nap of 30 minutes duration. A power nap. And he feels that this is refreshing. He is using a nasal Robbins mask interface. This has been described as comfortable or at least not bothersome to him. He has 2 nocturias at night. No trauma to neck or face, no tonsllectomy or nasal surgery. No TBI.  We were able today to  get a compliance report from Nicholas Robbins machine and it shows 100% compliance. He has used the machine on average 7 hours and 57 minutes at night he uses a ramp start at 4 cm water and a Robbins pressure of 5.5 cm water. His overall AHI also known as the apnea hypotony index was not elicited by this reading. There has also been no alarm set for air leakage. He has a C-Flex setting of 2 cm water.   10-09-14 : Continues to work at [employer], [address].  He uses Robbins faithfully- 100% over 4 hours, residual AHI 2 at 5.5 cm water, very low.   04-24-15: I reviewed today the Robbins download including the days of 12-20 6-16. The patient again has 100% compliance and a correlative usage of over 8 days and 22 hours. Average daily usage is 7 hours and 9 minutes. The patient is highly compliant but his residual AHI is not visible-  He is on a C-Flex setting of 2 cm water. He sleeps on average about 7 hours at night, going to bed at 10:30 and waking up at 5:30. He falls asleep promptly. I have the pleasure of seeing Nicholas Robbins today, for a routine compliance visit in our Robbins clinic. The patient continues to use Robbins at 100% compliance with 7 hours and 8 minutes on average nightly use, his  Robbins setting is at only 5.5 cm. His residual AHI is again not visible. The patient is using a REMstar plus machine. He was already  Diagnosed 7 or 8 years ago and this is his original machine. It would not satisfy today standard of therapeutic data collection. The patient reports some daytime sleepiness at 12 out of 24 points and fatigue at 31 out of a possible 53 points. He does not endorse depression. My goal would be to retest Nicholas Robbins see if he still has apnea since he is doing well on a very low pressure. He has lost some weight. If his AHI is higher than 8 I would continue treatment with Robbins.   07-13-2017. Nicholas Robbins, interval history from 13 July 2017 Nicholas Robbins indicates that he is very happy with  his new Robbins machine, no longer misses his old one, he underwent a 30-day continued auto titration between a minimum pressure setting of 5.6 and a maximum of 16 cm water.  He is 100% compliant Robbins user with the average use of time being 7 hours and 8 minutes each night, the EPR level is set at 1 cm, his 95th percentile pressure is 9.2 and his residual AHI is 3.4, this is very good.  He has some central apneas emerging however he seems to be able to use his Robbins throughout the night and actually sleep well.  His Epworth sleepiness score is endorsed at 15 points today, fatigue severity at 25 points. The  geriatric depression score is indicated at 2 out of 15 points . Sleep habits are unchanged.     Review of Systems: Epworth sleepiness score is endorsed at 15 points today,  He can fall asleep very easily. His fatigue severity at 25 points. The  geriatric depression score is indicated at 2 out of 15 points .  Out of a complete 14 system review, the patient complains of only the following symptoms, and all other reviewed systems are negative. Single, master's degree , religous studies. 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  Schedules naps, has driving sleepiness problems- especially driving after lunch. Takes 15 minute power naps.  has Modafinil.      Family History  Problem Relation Age of Onset  . Ulcers Nicholas [id number]       bleeding/ deceased  . Lymphoma Brother   . Breast cancer Sister   . Colon cancer Neg Hx   . Stomach cancer Neg Hx   . Rectal cancer Neg Hx   . Esophageal cancer Neg Hx   . Liver cancer Neg Hx     Past Medical History:  Diagnosis Date  . Arthritis   . BPH (benign prostatic hyperplasia)   . Colon polyps   . Diverticulitis   . Glaucoma   . Hypertension   . OSA on Robbins     Past Surgical History:  Procedure Laterality Date  . broken ankle  1961  . HERNIA REPAIR    . REPLACEMENT TOTAL KNEE  2005  . VITRECTOMY Left 2006    Current Outpatient Medications  Medication Sig  Dispense Refill  . aspirin 81 MG tablet Take 81 mg by mouth daily.    Marland Kitchen. BENICAR 20 MG tablet Take 20 mg by mouth daily.  3  . carvedilol (COREG) 6.25 MG tablet Take 6.25 mg by mouth 2 (two) times daily.  2  . COD LIVER OIL PO Take by mouth daily at 2 PM. One teaspoon    . fexofenadine (ALLEGRA) 180 MG tablet Take 180  mg by mouth daily.    . finasteride (PROSCAR) 5 MG tablet TAKE 1 TABLET DAILY AS DIRECTED.  3  . fluocinonide (LIDEX) 0.05 % external solution Apply 1 application topically 2 (two) times daily. Instil 2 gtts in ear twice weekly    . folic acid (FOLVITE) 800 MCG tablet Take 400 mcg by mouth daily.    . Garlic 1000 MG CAPS Take 1,000 mg by mouth 4 (four) times daily.    Marland Kitchen glucosamine-chondroitin 500-400 MG tablet Take 1 tablet by mouth 2 (two) times daily.    . hyoscyamine (LEVSIN SL) 0.125 MG SL tablet Place 0.125 mg under the tongue every 8 (eight) hours as needed for cramping.    . modafinil (PROVIGIL) 100 MG tablet Take 1 tablet (100 mg total) by mouth daily. 30 tablet 1  . Multiple Vitamin (MULTIVITAMIN) tablet Take 1 tablet by mouth daily. Shaklee vitamin w/o iron    . NON FORMULARY Juice Plus Orchard Blend  2 daily and Garden Blend 2 daily    . pravastatin (PRAVACHOL) 40 MG tablet Take 40 mg by mouth at bedtime.  2  . Probiotic Product (ALIGN PO) Take by mouth.    . silodosin (RAPAFLO) 8 MG CAPS capsule Take 8 mg by mouth daily with breakfast.    . Travoprost, BAK Free, (TRAVATAN) 0.004 % SOLN ophthalmic solution Place 1 drop into both eyes at bedtime.    . triamcinolone (NASACORT ALLERGY 24HR) 55 MCG/ACT AERO nasal inhaler Place 2 sprays into the nose daily.    Marland Kitchen UNABLE TO FIND Take 1 capsule by mouth 4 (four) times daily. Med Name: Juiceplus    . vitamin B-12 (CYANOCOBALAMIN) 1000 MCG tablet Take 1,000 mcg by mouth daily.     No current facility-administered medications for this visit.     Allergies as of 07/13/2017  . (No Known Allergies)    Vitals: BP 130/75    Pulse 67   Wt 188 lb (85.3 kg)   BMI 28.59 kg/m  Last Weight:  Wt Readings from Last 1 Encounters:  07/13/17 188 lb (85.3 kg)       Last Height:   Ht Readings from Last 1 Encounters:  02/09/17 5\' 8"  (1.727 m)    Physical exam:  General: The patient is awake, alert and appears not in acute distress. The patient is well groomed. Head: Normocephalic, atraumatic. Neck is supple. Mallampati 3.  neck circumference: 16. Nasal airflow congested, TMJ is by click on the right jaw evident . Retrognathia is not seen.  Cardiovascular:  Regular rate and rhythm, without  murmurs or carotid bruit, and without distended neck veins. Respiratory: Lungs are clear to auscultation. Skin:  Without evidence of edema, or rash Trunk: scoliosis  , lumbar fusion, hip pain.  Neurologic exam : The patient is awake and alert, oriented to place and time.   Memory subjective described as intact. There is a normal attention span & concentration ability. Speech is fluent without  dysarthria, dysphonia or aphasia.  Mood and affect are appropriate.  Cranial nerves: Pupils are equal and unreactive to light. Funduscopic exam with evidence of a air-bubble,bilaterally  cataract related lens replacement   , no  nystagmus. Visual fields by finger perimetry are intact. Hearing to finger rub intact. Facial sensation intact to fine touch. Facial motor strength is symmetric and tongue and uvula move midline.  There is a droopy shoulder on the left. He has a resting tremor on the left, pill rolling. Mild cog-wheeling on both biceps, recent  fall. Concerned about early PD - needs to be followed closely.   DTR equal.   Assessment:  After physical and neurologic examination, review of laboratory studies, imaging, neurophysiology testing and pre-existing records, assessment is :  OSA diagnosed 2010 in [place] - degree of apnea not known, he was told he was " severe " .   Repeated sleep study from 11-18-2016 confirmed apnea and he  was put on auto Robbins>  Doing well on 5.6 cm through 16 cm water with 1 cm EPR and few central apneas ( 1.2) , total AHI 3.4/hr.  The patient was advised of the nature of the diagnosed sleep disorder , the treatment options and risks for general a health and wellness arising from not treating the condition. Visit duration was 10 minutes. More than 50% of the time were reserved for information, discussion about OSA and the treatment option.   Plan:  Treatment plan and additional workup :   Patient with OSA by history, compliant 100% on Robbins.  No changes needed. His august 2018 sleep study and re-titration confirmed good resolution.   Rhinitis treated with Nasocort, but little reduction. Try Zyrtec daily.   He is receiving steroid injections for back pain, and right hip pain- Dr. Danielle Dess sees him. obtained an MRI 2-3-4-5 lumbar pain, status post fusion in [place],  [place].   He has mild resting tremor  a little more on the left hand with thumb ( pill rolling ) and droopy left shoulder. - and we will look and follow for early  PD symptoms to develop. He has fallen - he now walks with a 3 prong cane.   I give him a Sinemet 25/ 100 mg up to TID, trial with intake 1 hour before mass- that's when the tremor is most noticeable.    Rv with Robbins yearly. He had complaint of excessive daytime sleepiness, I suggested modafinil 100 mg tab. - [facility]     Nicholas Novas MD  07/13/2017

## 2017-07-13 NOTE — Patient Instructions (Addendum)
Carbidopa; Levodopa tablets What is this medicine? CARBIDOPA;LEVODOPA (kar bi DOE pa; lee voe DOE pa) is used to treat the symptoms of Parkinson's disease. This medicine may be used for other purposes; ask your health care provider or pharmacist if you have questions. COMMON BRAND NAME(S): Atamet, SINEMET What should I tell my health care provider before I take this medicine? They need to know if you have any of these conditions: -asthma or lung disease -depression or other mental illness -diabetes -glaucoma -heart disease, irregular heart beat -kidney or liver disease -melanoma or suspicious skin lesions --pregnant or trying to get pregnant -breast-feeding How should I use this medicine? Take this medicine by mouth with a glass of water. Follow the directions on the prescription label. Take your doses at regular intervals. Do not take your medicine more often than directed. Do not stop taking except on the advice of your doctor or health care professional. Talk to your pediatrician regarding the use of this medicine in children. Special care may be needed. Overdosage: If you think you have taken too much of this medicine contact a poison control center or emergency room at once. NOTE: This medicine is only for you. Do not share this medicine with others. What if I miss a dose? If you miss a dose, take it as soon as you can. If it is almost time for your next dose, take only that dose. Do not take double or extra doses. What may interact with this medicine? Do not take this medicine with any of the following medications: -MAOIs like Marplan, Nardil, and Parnate -reserpine -tetrabenazine This medicine may also interact with the following medications: -alcohol -droperidol -entacapone -iron supplements or multivitamins with iron -isoniazid, INH -linezolid -medicines for depression, anxiety, or psychotic disturbances -medicines for high blood pressure -medicines for  sleep -metoclopramide -papaverine -procarbazine -tedizolid -rasagiline -selegiline -tolcapone This list may not describe all possible interactions. Give your health care provider a list of all the medicines, herbs, non-prescription drugs, or dietary supplements you use. Also tell them if you smoke, drink alcohol, or use illegal drugs. Some items may interact with your medicine. What should I watch for while using this medicine? Visit your doctor or health care professional for regular checks on your progress. It may be several weeks or months before you feel the full benefits of this medicine. Continue to take your medicine on a regular schedule. Do not take any additional medicines for Parkinson's disease without first consulting with your health care provider. You may experience a wearing of effect prior to the time for your next dose of this medicine. You may also experience an on-off effect where the medicine apparently stops working for anything from a minute to several hours, then suddenly starts working again. Tell your doctor or health care professional if any of these symptoms happen to you. Your dose may need to be changed. A high protein diet can slow or prevent absorption of this medicine. Avoid high protein foods near the time of taking this medicine to help to prevent these problems. Take this medicine at least 30 minutes before eating or one hour after meals. You may want to eat higher protein foods later in the day or in small amounts. Discuss your diet with your doctor or health care professional or nutritionist. You may get drowsy or dizzy. Do not drive, use machinery, or do anything that needs mental alertness until you know how this drug affects you. Do not stand or sit up quickly, especially if  you are an older patient. This reduces the risk of dizzy or fainting spells. Alcohol can make you more drowsy and dizzy. Avoid alcoholic drinks. If you find that you have sudden feelings of  wanting to sleep during normal activities, like cooking, watching television, or while driving or riding in a car, you should contact your health care professional. If you are diabetic, this medicine may interfere with the accuracy of some tests for sugar or ketones in the urine (does not interfere with blood tests). Check with your doctor or health care professional before changing the dose of your diabetic medicine. This medicine may discolor the urine or sweat, making it look darker or red in color. This is of no cause for concern. However, this may stain clothing or fabrics. There have been reports of increased sexual urges or other strong urges such as gambling while taking some medicines for Parkinson's disease. If you experience any of these urges while taking this medicine, you should report it to your health care provider as soon as possible. You should check your skin often for changes to moles and new growths while taking this medicine. Call your doctor if you notice any of these changes. What side effects may I notice from receiving this medicine? Side effects that you should report to your doctor or health care professional as soon as possible: -allergic reactions like skin rash, itching or hives, swelling of the face, lips, or tongue -anxiety, confusion, or nervousness -falling asleep during normal activities like driving -fast, irregular heartbeat -hallucination, loss of contact with reality -mood changes like aggressive behavior, depression -stomach pain -trouble passing urine -uncontrolled movements of the mouth, head, hands, feet, shoulders, eyelids or other unusual muscle movements Side effects that usually do not require medical attention (report to your doctor or health care professional if they continue or are bothersome): -headache -loss of appetite -muscle twitches -nausea, vomiting -nightmares, trouble sleeping -unusually weak or tired This list may not describe all  possible side effects. Call your doctor for medical advice about side effects. You may report side effects to FDA at 1-800-FDA-1088. Where should I keep my medicine? Keep out of the reach of children. Store at room temperature between 15 and 30 degrees C (59 and 86 degrees F). Protect from light. Throw away any unused medicine after the expiration date. NOTE: This sheet is a summary. It may not cover all possible information. If you have questions about this medicine, talk to your doctor, pharmacist, or health care provider.  2018 Elsevier/Gold Standard (2013-06-14 15:41:53)    I prescribed Sinemet ( C-Dopa, L- Dopa ) for  Tremor- you may not take it three times daily, and only once or twice a day. You should take it on an empty stomach.

## 2017-07-15 ENCOUNTER — Telehealth: Payer: Self-pay | Admitting: Neurology

## 2017-07-15 NOTE — Telephone Encounter (Signed)
PA completed for modafinil through cover my meds through [facility].. Can take up to 72 hours before hearing back. KEY: [id number]

## 2017-07-15 NOTE — Telephone Encounter (Signed)
PA approved through [facility] until 01/15/2018.  PA# [id number]

## 2017-09-19 IMAGING — CT CT ABD-PELV W/ CM
2 of 5 series · 15 of 46 positions shown, 17 images · IV contrast (iopamidol)
Comparison: None.

CLINICAL DATA: Eight week history of persistent left lower quadrant
abdominal pain, bloating, diarrhea and nausea.

EXAM:
CT ABDOMEN AND PELVIS WITH CONTRAST
TECHNIQUE: Multidetector CT imaging of the abdomen and pelvis was performed
using the standard protocol following bolus administration of
intravenous contrast.
CONTRAST:  100mL NHBGGT-OZZ IOPAMIDOL (NHBGGT-OZZ) INJECTION 61%

[Series 3: abd/pel w · axial · 0.83mm/px · z∈[-514,-108]mm · 12 of 91 slices shown, 14 images]
[im 5/91  soft-tissue]
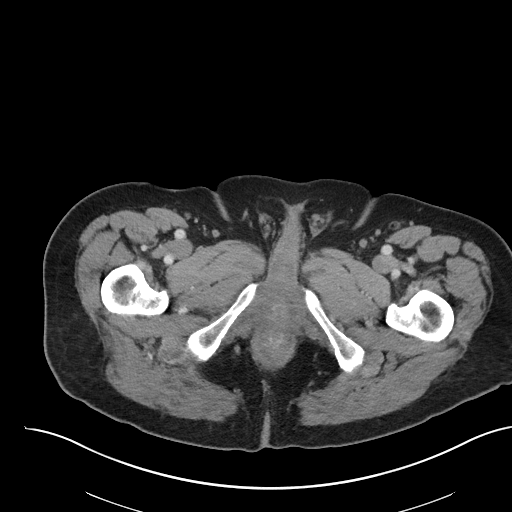
[im 5/91  bone]
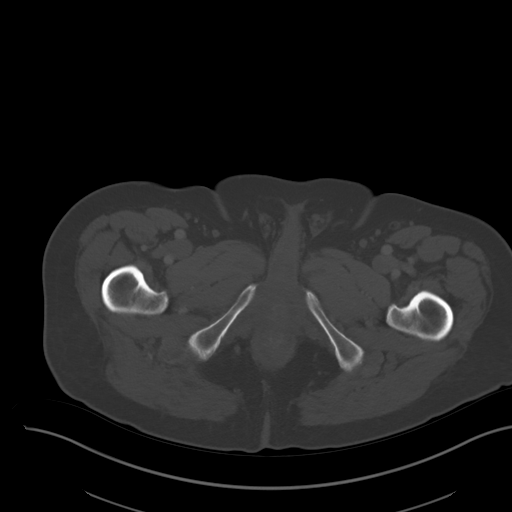
[im 15/91  soft-tissue]
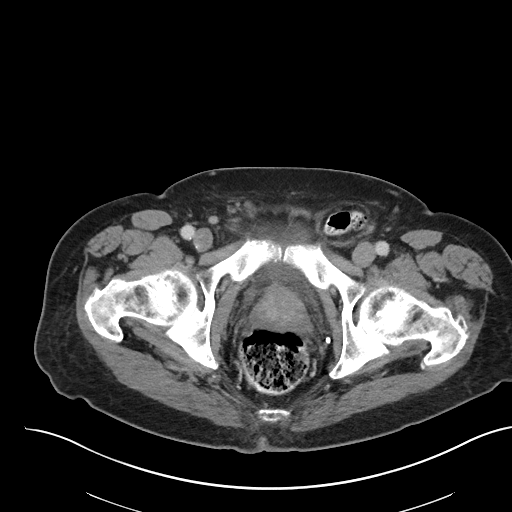
[im 19/91  soft-tissue]
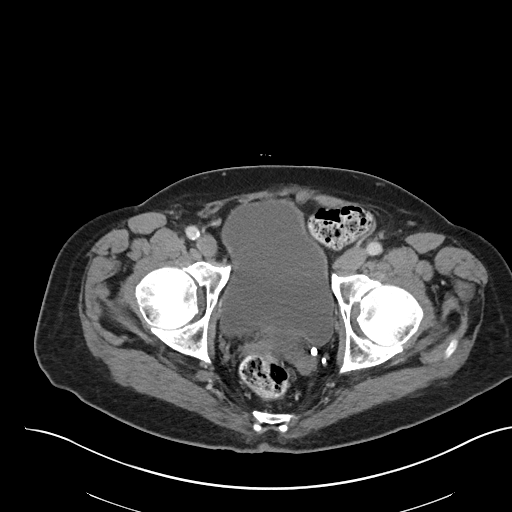
[im 29/91  soft-tissue]
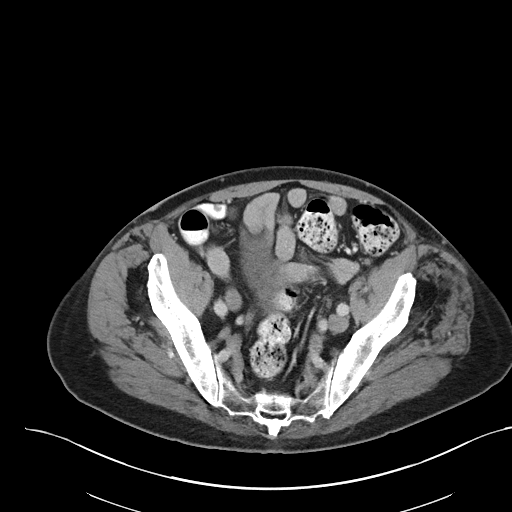
[im 34/91  soft-tissue]
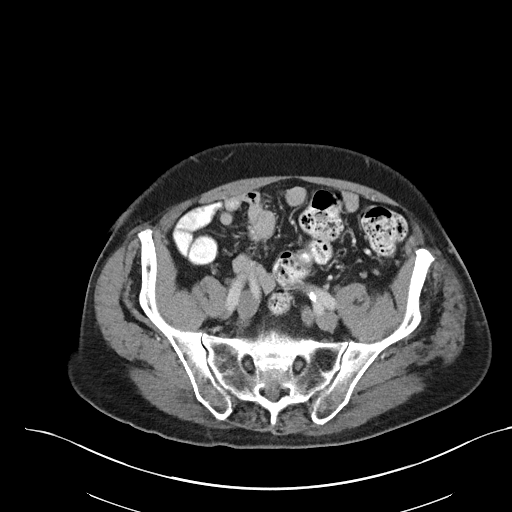
[im 43/91  soft-tissue]
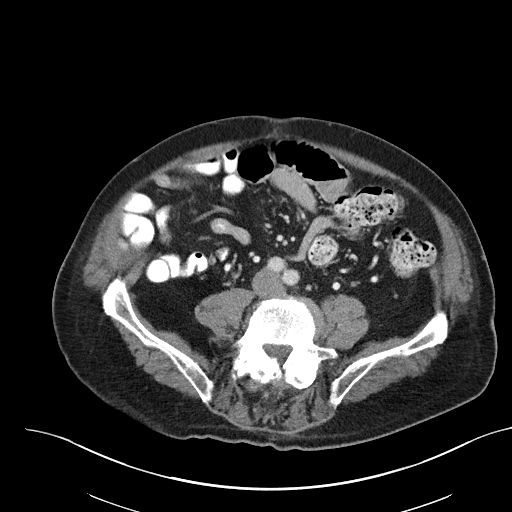
[im 48/91  soft-tissue]
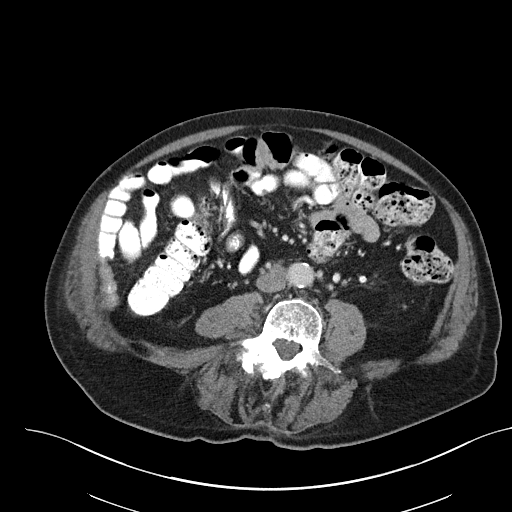
[im 57/91  soft-tissue]
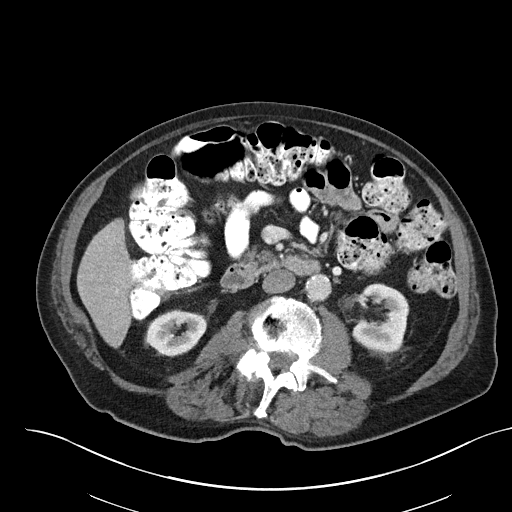
[im 62/91  soft-tissue]
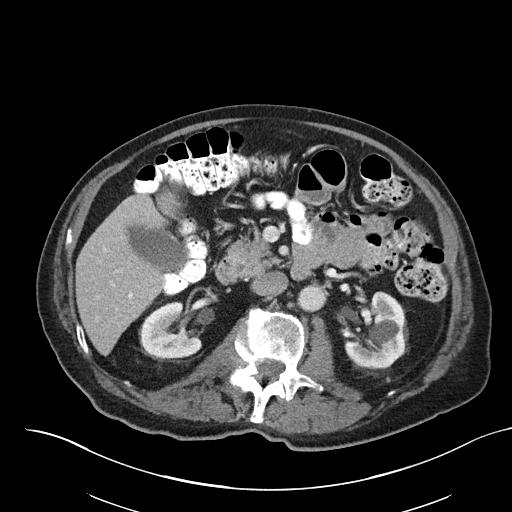
[im 62/91  bone]
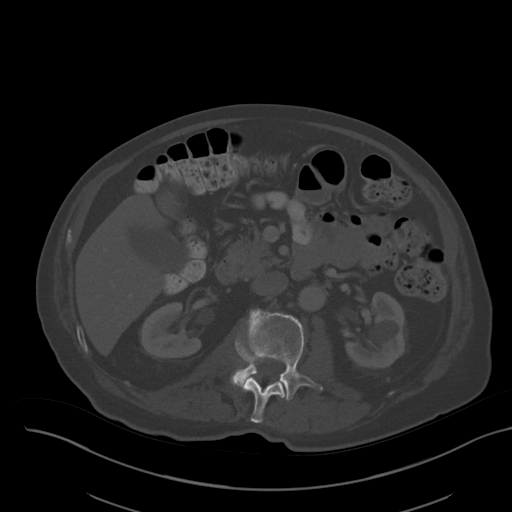
[im 72/91  soft-tissue]
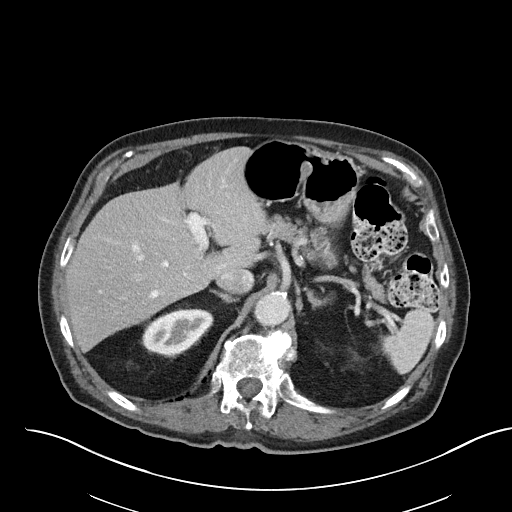
[im 76/91  soft-tissue]
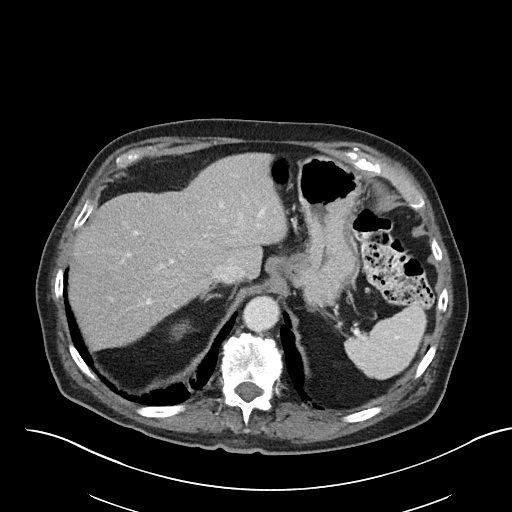
[im 86/91  soft-tissue]
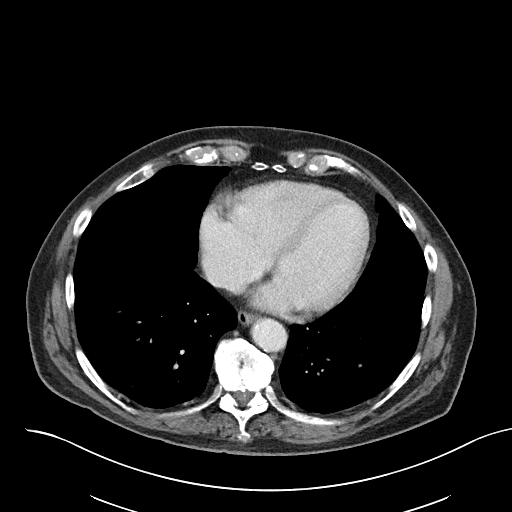

[Series 7: abd/pel w st · coronal · 0.77mm/px · 3 of 88 slices shown]
[im 30/88  soft-tissue]
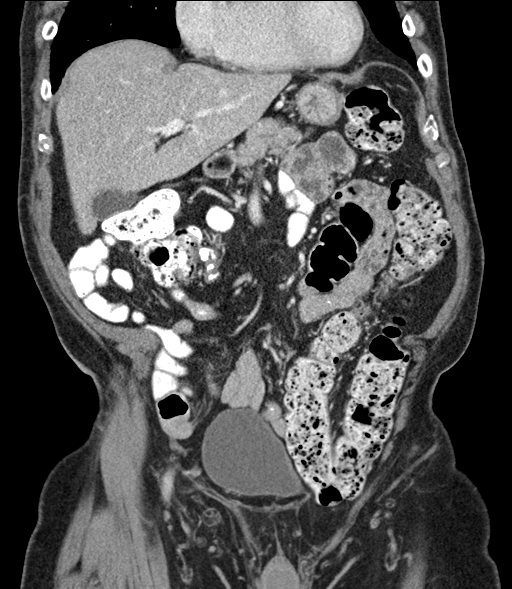
[im 39/88  soft-tissue]
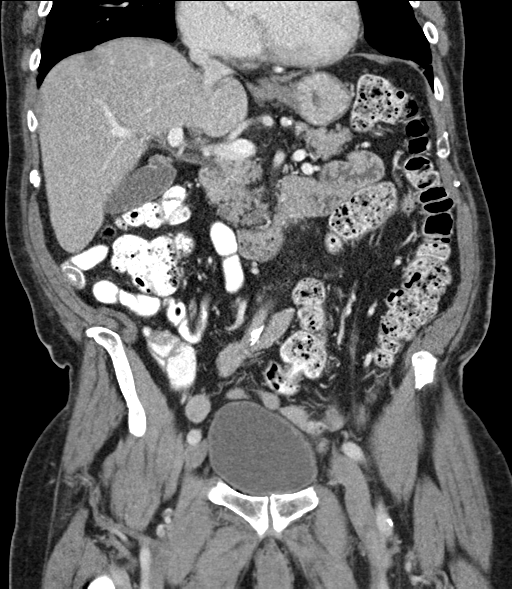
[im 49/88  soft-tissue]
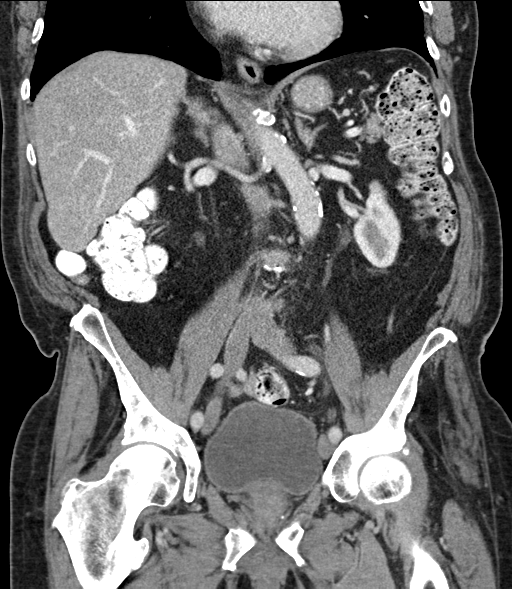

[15 of 46 positions shown; findings below may reference images not displayed]

FINDINGS: Lower chest: Stable rounded density at the right lung base which
appears to be part of scarring change better appreciated on the
sagittal sequence. This is unchanged since the prior chest CT from
05/11/2013. No acute basilar pulmonary findings. The heart is normal
in size. No pericardial effusion. The distal esophagus is grossly
normal.

Hepatobiliary: No focal hepatic lesions or intrahepatic biliary
dilatation. The gallbladder is normal. No common bile duct
dilatation.

Pancreas: No mass, inflammation or ductal dilatation.

Spleen: Normal size.  No focal lesions.

Adrenals/Urinary Tract: The adrenal glands are unremarkable and
stable.

The kidneys are normal. No renal, ureteral or bladder calculi or
mass.

Stomach/Bowel: The stomach, duodenum, small bowel and colon are
unremarkable. No inflammatory changes, mass lesions or obstructive
findings. There is a markedly elongated and tortuous sigmoid colon
and part of the sigmoid colon is in the left inguinal hernia but
there is no findings for incarceration or obstruction. It is
possible this could be causing intermittent left lower quadrant
pain.

There is a mobile high cecum in the midline. The terminal ileum is
normal. The appendix is normal.

Vascular/Lymphatic: Moderate atherosclerotic calcifications
involving the aorta and branch vessel ostia. No aneurysm or
dissection. Marked tortuosity in part due to scoliosis.

No mesenteric or retroperitoneal mass or adenopathy. No pelvic
adenopathy.

Reproductive: The prostate gland and seminal vesicles are
unremarkable.

Other: No pelvic mass or adenopathy. No free pelvic fluid
collections. No inguinal mass or adenopathy. No abdominal wall
hernia or subcutaneous lesions.

Musculoskeletal: Thoracolumbar scoliosis and associated degenerative
lumbar spondylosis with multilevel disc disease and facet disease.
No acute bony findings. Mild degenerative changes involving both
hips. No AVN.

Evidence of prior back surgery with laminectomies. Moderate fatty
change involving the gluteus minimus muscles bilaterally and gluteus
medius muscle on the left.
IMPRESSION: No acute abdominal/ pelvic findings, mass lesions or
lymphadenopathy.

Left inguinal hernia containing part of the sigmoid colon but no
findings for obstruction or incarceration.

Moderate atherosclerotic calcifications involving the aorta and
branch vessels.

## 2017-10-05 ENCOUNTER — Other Ambulatory Visit: Payer: Self-pay | Admitting: Orthopedic Surgery

## 2017-10-09 ENCOUNTER — Other Ambulatory Visit: Payer: Self-pay

## 2017-10-09 ENCOUNTER — Encounter (HOSPITAL_BASED_OUTPATIENT_CLINIC_OR_DEPARTMENT_OTHER): Payer: Self-pay | Admitting: *Deleted

## 2017-10-11 ENCOUNTER — Encounter: Payer: Self-pay | Admitting: Neurology

## 2017-10-13 ENCOUNTER — Encounter: Payer: Self-pay | Admitting: Neurology

## 2017-10-13 ENCOUNTER — Ambulatory Visit: Payer: Medicare Other | Admitting: Neurology

## 2017-10-13 VITALS — BP 111/64 | HR 65 | Ht 68.0 in | Wt 184.0 lb

## 2017-10-13 DIAGNOSIS — G20 Parkinson's disease: Secondary | ICD-10-CM

## 2017-10-13 DIAGNOSIS — G5603 Carpal tunnel syndrome, bilateral upper limbs: Secondary | ICD-10-CM | POA: Diagnosis not present

## 2017-10-13 DIAGNOSIS — Z9989 Dependence on other enabling machines and devices: Secondary | ICD-10-CM

## 2017-10-13 DIAGNOSIS — G4733 Obstructive sleep apnea (adult) (pediatric): Secondary | ICD-10-CM | POA: Diagnosis not present

## 2017-10-13 MED ORDER — CARBIDOPA-LEVODOPA 25-100 MG PO TABS: 1.0000 | ORAL_TABLET | Freq: Three times a day (TID) | ORAL | 2 refills | Status: DC

## 2017-10-13 NOTE — Addendum Note (Signed)
Addended by: Melvyn NovasHMEIER, Jaimee Corum on: 10/13/2017 11:11 AM   Modules accepted: Orders

## 2017-10-13 NOTE — Patient Instructions (Signed)
Your Carbi-dopa / Levo-dopa medication is used to treat Parkinson's disease and symptoms.   Start using the medication twice daly for 14 days, 45 minutes before or after a meal, with water. Observe if your tremor decreases and for how long.  Advance to three times daily after 14 days.  Start with chair Yoga, tai-Chi or similar exercises that help core strength and balance.  IF you have a positive effect, see me in 6 month and if not, see me in 3 month.

## 2017-10-13 NOTE — Progress Notes (Signed)
SLEEP MEDICINE CLINIC   Provider:  Melvyn Novasarmen  Betzayda Braxton, M D  Referring Provider: Rodrigo RanPerini, Mark, MD Primary Care Physician:  Rodrigo RanPerini, Mark, MD  Chief Complaint  Patient presents with  . Follow-up    pt alone, rm 11. pt states all is well with his cpap machine. DME [facility]. pt is having carpal tunnel surgery on thursday for his right hand/wrist.     HPI:  Nicholas Robbins is a 84 y.o. male  seen here as a referral from Dr. 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 He needs to establish himself with a new DME and sleep clinic.  He received his diagnosis of obstructive sleep apnea approximately 5 years ago and has used CPAP nightly. He carries the diagnosis of hypertension and has been treated with medications since 2000 he has benign prostate hyperplasia, and a coronary arthrosclerosis was diagnosed by chest CT in generally 2015. Dilated pulmonary arteries in generally 15 suspect that this is remote pulmonary embolism. And he had whooping cough as a child. His usual bedtime is 10:30 PM, he will promptly go to sleep within 5-10 minutes. He lives alone and there is no report of snoring but he does not feel that there is any effect of this being present. He wakes up usually restored or refreshed and has the most energy in the morning. His overall nocturnal sleep time is more than 6 hours and he rises at 5:30 AM regularly. He sets breakfasts for the guess that the pain center the morning has this as a routine as well, he drinks 2 cups of coffee in the morning, very rarely any daytime caffeine especially not in the form of soda. Every other day he will get a daytime nap of 30 minutes duration. A power nap. And he feels that this is refreshing. He is using a nasal CPAP mask interface. This has been described as comfortable or at least not bothersome to him. He has 2  nocturias at night. No trauma to neck or face, no tonsllectomy or nasal surgery. No TBI.  We were able today to get a compliance report from Nicholas Robbins's CPAP machine and it shows 100% compliance. He has used the machine on average 7 hours and 57 minutes at night he uses a ramp start at 4 cm water and a CPAP pressure of 5.5 cm water. His overall AHI also known as the apnea hypotony index was not elicited by this reading. There has also been no alarm set for air leakage. He has a C-Flex setting of 2 cm water.   10-09-14 : Continues to work at [employer], [address] [employer] [employer].  He uses CPAP faithfully- 100% over 4 hours, residual AHI 2 at 5.5 cm water, very low.   04-24-15: I reviewed today the CPAP download including the days of 12-20 6-16. The patient again has 100% compliance and a correlative usage of over 8 days and 22 hours. Average daily usage is 7 hours and 9 minutes. The patient is highly compliant but his residual AHI is not visible-  He is on a C-Flex setting of 2 cm water. He sleeps on average about 7 hours at night, going to bed at 10:30 and waking up at 5:30. He falls asleep promptly. I have the pleasure of seeing Nicholas Robbins today, for a routine compliance visit in our CPAP clinic. The patient  continues to use CPAP at 100% compliance with 7 hours and 8 minutes on average nightly use, his CPAP setting is at only 5.5 cm. His residual AHI is again not visible. The patient is using a REMstar plus machine. He was already  Diagnosed 7 or 8 years ago and this is his original machine. It would not satisfy today standard of therapeutic data collection. The patient reports some daytime sleepiness at 12 out of 24 points and fatigue at 31 out of a possible 53 points. He does not endorse depression. My goal would be to retest Mr. Wix see if he still has apnea since he is doing well on a very low pressure. He has lost some weight. If his AHI is higher than 8 I would continue treatment with  CPAP.   07-13-2017. Nicholas Robbins, interval history from 13 July 2017 Nicholas Robbins indicates that he is very happy with his new CPAP machine, no longer misses his old one, he underwent a 30-day continued auto titration between a minimum pressure setting of 5.6 and a maximum of 16 cm water.  He is 100% compliant CPAP user with the average use of time being 7 hours and 8 minutes each night, the EPR level is set at 1 cm, his 95th percentile pressure is 9.2 and his residual AHI is 3.4, this is very good.  He has some central apneas emerging however he seems to be able to use his CPAP throughout the night and actually sleep well.  His Epworth sleepiness score is endorsed at 15 points today, fatigue severity at 25 points. The  geriatric depression score is indicated at 2 out of 15 points . Sleep habits are unchanged.   10-13-2017,   Nicholas Robbins presents for a follow up- he has more rigor, more masked facial expression, more dysphonia. He looks very parkinsonian now and has trouble with handwriting, due to tremor in his left dominant hand.  Left shoulder is droopy, he has lost muscle bulk bilaterally in the hypothenar eminence. He plans for a carpal tunnel surgery. He has not tried Primidone as it may leave him drowsy. He has not taken sinemet,  - he felt no response after 2 days (!) . I urged him to try again.   He walks with a cane, and since he carries a cane in his right hand it may have contributed a little bit to pressure on the wrist.  He was also reporting that he for long time had degenerative changes in his neck spine and may need a imaging study to rule out a spinal stenosis or radiculopathy at that level.   He has been highly compliant as usually with a CPAP, 100% compliance 7 hours and 6 minutes his average nightly use of time.  Minimum pressure 5.6 maximum pressure of 16 cmH2O 1 cm EPR and a residual AHI of 1.3.  Excellent resolution.      Review of Systems: Epworth sleepiness score is  endorsed at 15 points today,  He can fall asleep very easily. His fatigue severity at 25 points. The  geriatric depression score is indicated at 2 out of 15 points .  Out of a complete 14 system review, the patient complains of only the following symptoms, and all other reviewed systems are negative. Single, master's degree , religous studies. 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  Schedules naps, has driving sleepiness problems- especially driving after lunch.  T akes 15 minute power naps, many.  Vivid dreams. No witness to his sleep.  Has ben given Modafinil and not yet used it.       Family History  Problem Relation Age of Onset  . Ulcers Nicholas 18       bleeding/ deceased  . Lymphoma Brother   . Breast cancer Sister   . Colon cancer Neg Hx   . Stomach cancer Neg Hx   . Rectal cancer Neg Hx   . Esophageal cancer Neg Hx   . Liver cancer Neg Hx     Past Medical History:  Diagnosis Date  . Arthritis   . Benign essential tremor   . BPH (benign prostatic hyperplasia)   . Colon polyps   . Diverticulitis   . Glaucoma   . Hypertension   . OSA on CPAP    use CPAP nightly    Past Surgical History:  Procedure Laterality Date  . BACK SURGERY     lumbar fusion  . broken ankle  1961  . HERNIA REPAIR    . REPLACEMENT TOTAL KNEE  2005  . VITRECTOMY Left 2006    Current Outpatient Medications  Medication Sig Dispense Refill  . aspirin 81 MG tablet Take 81 mg by mouth daily.    Marland Kitchen BENICAR 20 MG tablet Take 20 mg by mouth daily.  3  . carvedilol (COREG) 6.25 MG tablet Take 6.25 mg by mouth 2 (two) times daily.  2  . COD LIVER OIL PO Take by mouth daily at 2 PM. One teaspoon    . fexofenadine (ALLEGRA) 180 MG tablet Take 180 mg by mouth daily.    . finasteride (PROSCAR) 5 MG tablet TAKE 1 TABLET DAILY AS DIRECTED.  3  . fluocinonide (LIDEX) 0.05 % external solution Apply 1 application topically 2 (two) times daily. Instil 2 gtts in ear twice weekly    . folic acid (FOLVITE) 800 MCG tablet  Take 400 mcg by mouth daily.    . Garlic 1000 MG CAPS Take 1,000 mg by mouth 4 (four) times daily.    Marland Kitchen glucosamine-chondroitin 500-400 MG tablet Take 1 tablet by mouth 2 (two) times daily.    . hyoscyamine (LEVSIN SL) 0.125 MG SL tablet Place 0.125 mg under the tongue every 8 (eight) hours as needed for cramping.    . Multiple Vitamin (MULTIVITAMIN) tablet Take 1 tablet by mouth daily. Shaklee vitamin w/o iron    . NON FORMULARY Juice Plus Orchard Blend  2 daily and Garden Blend 2 daily    . pravastatin (PRAVACHOL) 40 MG tablet Take 40 mg by mouth at bedtime.  2  . Probiotic Product (ALIGN PO) Take by mouth.    . silodosin (RAPAFLO) 8 MG CAPS capsule Take 8 mg by mouth daily with breakfast.    . Travoprost, BAK Free, (TRAVATAN) 0.004 % SOLN ophthalmic solution Place 1 drop into both eyes at bedtime.    . triamcinolone (NASACORT ALLERGY 24HR) 55 MCG/ACT AERO nasal inhaler Place 2 sprays into the nose daily.    Marland Kitchen UNABLE TO FIND Take 1 capsule by mouth 4 (four) times daily. Med Name: Juiceplus    . vitamin B-12 (CYANOCOBALAMIN) 1000 MCG tablet Take 1,000 mcg by mouth daily.    . modafinil (PROVIGIL) 100 MG tablet TAKE 1 TABLET BY MOUTH EVERY DAY     No current facility-administered medications for this visit.     Allergies as of 10/13/2017  . (No Known Allergies)    Vitals: BP 111/64   Pulse 65   Ht 5\' 8"  (1.727 m)   Wt  184 lb (83.5 kg)   BMI 27.98 kg/m  Last Weight:  Wt Readings from Last 1 Encounters:  10/13/17 184 lb (83.5 kg)       Last Height:   Ht Readings from Last 1 Encounters:  10/13/17 5\' 8"  (1.727 m)    Physical exam:  General: The patient is awake, alert and appears not in acute distress. The patient is well groomed. Head: Normocephalic, atraumatic. Neck is supple. Mallampati 3.  neck circumference: 16. Nasal airflow congested, TMJ is by click on the right jaw evident . Retrognathia is not seen.  Cardiovascular:  Regular rate and rhythm, without  murmurs or carotid  bruit, and without distended neck veins. Respiratory: Lungs are clear to auscultation. Skin:  Without evidence of edema, or rash Trunk: scoliosis  , lumbar fusion, hip pain.  Neurologic exam : The patient is awake and alert, oriented to place and time.   Memory subjective described as intact. There is a normal attention span & concentration ability. Speech is fluent without dysarthria, dysphonia or aphasia.  Mood and affect are appropriate.  Cranial nerves: Pupils are equal and unreactive to light. Funduscopic exam with evidence of a air-bubble, bilaterally cataract related lens replacement , no  nystagmus. Visual fields by finger perimetry are intact. Hearing to finger rub intact. Facial sensation intact to fine touch. Facial motor strength is symmetric and tongue and uvula move midline.  There is a droopy shoulder on the left. He has a resting tremor on the left, pill rolling. Mild cog-wheeling on both biceps, recent fall. Concerned about early PD - needs to be followed closely.   DTR equally attenuated- almost trace. Babinski deferred   Assessment:  After physical and neurologic examination, review of laboratory studies, imaging, neurophysiology testing and pre-existing records, assessment is :  OSA diagnosed 2010 in [place] - degree of apnea not known, he was told he was " severe " .   Repeated sleep study from 11-18-2016 confirmed apnea and he was put on auto CPAP>  Doing well on 5.6 cm through 16 cm water with 1 cm EPR and few central apneas ( 1.2) , total AHI 3.4/hr.  The patient was advised of the nature of the diagnosed sleep disorder , the treatment options and risks for general a health and wellness arising from not treating the condition. Visit duration was 10 minutes. More than 50% of the time were reserved for information, discussion about OSA and the treatment option.   Plan:  Treatment plan and additional workup :   Patient with OSA by history, compliant 100% on CPAP.  No  changes needed. His august 2018 sleep study and re-titration confirmed good resolution.   Rhinitis treated with Nasocort, but little reduction. Try Zyrtec daily.   He is receiving steroid injections for back pain, and right hip pain- Dr. Danielle Dess sees him. obtained an MRI 2-3-4-5 lumbar pain, status post fusion in [place],  [place].   He has mild resting tremor a little more on the left hand with thumb ( pill rolling ) and droopy left shoulder. - and we will look and follow for early  PD symptoms to develop. He has fallen - he now walks with a 3 prong cane.  I give him a Sinemet 25/ 100 mg up to TID, trial with intake 1 hour before mass- that's when the tremor is most noticeable.  He has not started it yet.    Rv with CPAP yearly. He had complaint of excessive daytime sleepiness, I suggested modafinil  100 mg tab. -  [facility]   Start Sinemet Bid for 14 days , than advance to TID po.  Carpal tunnel may be related to degenerative neck spine - had seen Dr. Merlyn Lot- Had NCV and EMG and c spine x rays there.    Porfirio Mylar Merilyn Pagan MD    10/13/2017

## 2017-10-15 ENCOUNTER — Encounter (HOSPITAL_BASED_OUTPATIENT_CLINIC_OR_DEPARTMENT_OTHER): Payer: Self-pay | Admitting: Anesthesiology

## 2017-10-15 ENCOUNTER — Ambulatory Visit (HOSPITAL_BASED_OUTPATIENT_CLINIC_OR_DEPARTMENT_OTHER): Payer: Medicare Other | Admitting: Anesthesiology

## 2017-10-15 ENCOUNTER — Encounter (HOSPITAL_BASED_OUTPATIENT_CLINIC_OR_DEPARTMENT_OTHER)
Admission: RE | Disposition: A | Discharge status: Home / Self Care | Payer: Self-pay | Source: Ambulatory Visit | Attending: Orthopedic Surgery

## 2017-10-15 ENCOUNTER — Ambulatory Visit (HOSPITAL_BASED_OUTPATIENT_CLINIC_OR_DEPARTMENT_OTHER)
Admission: RE | Admit: 2017-10-15 | Discharge: 2017-10-15 | Disposition: A | Discharge status: Home / Self Care | Payer: Medicare Other | Source: Ambulatory Visit | Attending: Orthopedic Surgery | Admitting: Orthopedic Surgery

## 2017-10-15 DIAGNOSIS — G4733 Obstructive sleep apnea (adult) (pediatric): Secondary | ICD-10-CM | POA: Diagnosis not present

## 2017-10-15 DIAGNOSIS — I10 Essential (primary) hypertension: Secondary | ICD-10-CM | POA: Diagnosis not present

## 2017-10-15 DIAGNOSIS — Z9989 Dependence on other enabling machines and devices: Secondary | ICD-10-CM | POA: Insufficient documentation

## 2017-10-15 DIAGNOSIS — G5601 Carpal tunnel syndrome, right upper limb: Secondary | ICD-10-CM | POA: Diagnosis not present

## 2017-10-15 DIAGNOSIS — R200 Anesthesia of skin: Secondary | ICD-10-CM | POA: Diagnosis present

## 2017-10-15 DIAGNOSIS — Z96659 Presence of unspecified artificial knee joint: Secondary | ICD-10-CM | POA: Diagnosis not present

## 2017-10-15 HISTORY — PX: CARPAL TUNNEL RELEASE: SHX101

## 2017-10-15 HISTORY — DX: Essential tremor: G25.0

## 2017-10-15 SURGERY — CARPAL TUNNEL RELEASE
Anesthesia: Monitor Anesthesia Care | Site: Wrist | Laterality: Right

## 2017-10-15 MED ORDER — MIDAZOLAM HCL 2 MG/2ML IJ SOLN: 1.0000 mg | INTRAMUSCULAR | Status: DC | PRN

## 2017-10-15 MED ORDER — BUPIVACAINE HCL (PF) 0.25 % IJ SOLN
INTRAMUSCULAR | Status: DC | PRN
  Administered 2017-10-15: 8 mL

## 2017-10-15 MED ORDER — HYDROCODONE-ACETAMINOPHEN 7.5-325 MG PO TABS: 1.0000 | ORAL_TABLET | Freq: Once | ORAL | Status: DC | PRN

## 2017-10-15 MED ORDER — LIDOCAINE HCL (PF) 0.5 % IJ SOLN
INTRAMUSCULAR | Status: DC | PRN
  Administered 2017-10-15: 30 mL via INTRAVENOUS

## 2017-10-15 MED ORDER — FENTANYL CITRATE (PF) 100 MCG/2ML IJ SOLN
INTRAMUSCULAR | Status: AC
  Filled 2017-10-15: qty 2

## 2017-10-15 MED ORDER — PROPOFOL 10 MG/ML IV BOLUS
INTRAVENOUS | Status: DC | PRN
  Administered 2017-10-15: 10 mg via INTRAVENOUS

## 2017-10-15 MED ORDER — FENTANYL CITRATE (PF) 100 MCG/2ML IJ SOLN
50.0000 ug | INTRAMUSCULAR | Status: DC | PRN
  Administered 2017-10-15: 50 ug via INTRAVENOUS

## 2017-10-15 MED ORDER — LACTATED RINGERS IV SOLN
INTRAVENOUS | Status: DC
  Administered 2017-10-15: 10:00:00 via INTRAVENOUS

## 2017-10-15 MED ORDER — CEFAZOLIN SODIUM-DEXTROSE 2-4 GM/100ML-% IV SOLN
INTRAVENOUS | Status: AC
  Filled 2017-10-15: qty 100

## 2017-10-15 MED ORDER — FENTANYL CITRATE (PF) 100 MCG/2ML IJ SOLN: 25.0000 ug | INTRAMUSCULAR | Status: DC | PRN

## 2017-10-15 MED ORDER — SCOPOLAMINE 1 MG/3DAYS TD PT72: 1.0000 | MEDICATED_PATCH | Freq: Once | TRANSDERMAL | Status: DC | PRN

## 2017-10-15 MED ORDER — ONDANSETRON HCL 4 MG/2ML IJ SOLN
INTRAMUSCULAR | Status: DC | PRN
  Administered 2017-10-15: 4 mg via INTRAVENOUS

## 2017-10-15 MED ORDER — MEPERIDINE HCL 25 MG/ML IJ SOLN
6.2500 mg | INTRAMUSCULAR | Status: DC | PRN
Start: 2017-10-15 — End: 2017-10-15

## 2017-10-15 MED ORDER — CEFAZOLIN SODIUM-DEXTROSE 2-4 GM/100ML-% IV SOLN
2.0000 g | INTRAVENOUS | Status: AC
  Administered 2017-10-15: 2 g via INTRAVENOUS

## 2017-10-15 MED ORDER — ONDANSETRON HCL 4 MG/2ML IJ SOLN: 4.0000 mg | Freq: Once | INTRAMUSCULAR | Status: DC | PRN

## 2017-10-15 MED ORDER — CHLORHEXIDINE GLUCONATE 4 % EX LIQD: 60.0000 mL | Freq: Once | CUTANEOUS | Status: DC

## 2017-10-15 MED ORDER — HYDROCODONE-ACETAMINOPHEN 5-325 MG PO TABS: 1.0000 | ORAL_TABLET | Freq: Four times a day (QID) | ORAL | 0 refills | Status: DC | PRN

## 2017-10-15 SURGICAL SUPPLY — 32 items
BLADE SURG 15 STRL LF DISP TIS (BLADE) ×1 IMPLANT
BLADE SURG 15 STRL SS (BLADE) ×2
BNDG COHESIVE 3X5 TAN STRL LF (GAUZE/BANDAGES/DRESSINGS) ×3 IMPLANT
BNDG COHESIVE 4X5 TAN STRL (GAUZE/BANDAGES/DRESSINGS) ×3 IMPLANT
BNDG ESMARK 4X9 LF (GAUZE/BANDAGES/DRESSINGS) IMPLANT
BNDG GAUZE ELAST 4 BULKY (GAUZE/BANDAGES/DRESSINGS) ×3 IMPLANT
CHLORAPREP W/TINT 26ML (MISCELLANEOUS) ×3 IMPLANT
CORD BIPOLAR FORCEPS 12FT (ELECTRODE) ×3 IMPLANT
COVER BACK TABLE 60X90IN (DRAPES) ×3 IMPLANT
COVER MAYO STAND STRL (DRAPES) ×3 IMPLANT
CUFF TOURNIQUET SINGLE 18IN (TOURNIQUET CUFF) ×3 IMPLANT
DRAPE EXTREMITY T 121X128X90 (DRAPE) ×3 IMPLANT
DRAPE SURG 17X23 STRL (DRAPES) ×3 IMPLANT
DRSG PAD ABDOMINAL 8X10 ST (GAUZE/BANDAGES/DRESSINGS) ×3 IMPLANT
GAUZE SPONGE 4X4 12PLY STRL (GAUZE/BANDAGES/DRESSINGS) ×3 IMPLANT
GAUZE XEROFORM 1X8 LF (GAUZE/BANDAGES/DRESSINGS) ×3 IMPLANT
GLOVE BIOGEL PI IND STRL 8.5 (GLOVE) ×1 IMPLANT
GLOVE BIOGEL PI INDICATOR 8.5 (GLOVE) ×2
GLOVE SURG ORTHO 8.0 STRL STRW (GLOVE) ×3 IMPLANT
GOWN STRL REUS W/ TWL LRG LVL3 (GOWN DISPOSABLE) ×1 IMPLANT
GOWN STRL REUS W/TWL LRG LVL3 (GOWN DISPOSABLE) ×2
GOWN STRL REUS W/TWL XL LVL3 (GOWN DISPOSABLE) ×3 IMPLANT
NEEDLE PRECISIONGLIDE 27X1.5 (NEEDLE) IMPLANT
NS IRRIG 1000ML POUR BTL (IV SOLUTION) ×3 IMPLANT
PACK BASIN DAY SURGERY FS (CUSTOM PROCEDURE TRAY) ×3 IMPLANT
STOCKINETTE 4X48 STRL (DRAPES) ×3 IMPLANT
SUT ETHILON 4 0 PS 2 18 (SUTURE) ×3 IMPLANT
SUT VICRYL 4-0 PS2 18IN ABS (SUTURE) IMPLANT
SYR BULB 3OZ (MISCELLANEOUS) ×3 IMPLANT
SYR CONTROL 10ML LL (SYRINGE) IMPLANT
TOWEL GREEN STERILE FF (TOWEL DISPOSABLE) ×3 IMPLANT
UNDERPAD 30X30 (UNDERPADS AND DIAPERS) ×3 IMPLANT

## 2017-10-15 NOTE — Anesthesia Preprocedure Evaluation (Addendum)
Anesthesia Evaluation  Patient identified by MRN, date of birth, ID band Patient awake    Reviewed: Allergy & Precautions, NPO status , Patient's Chart, lab work & pertinent test results, reviewed documented beta blocker date and time   Airway Mallampati: II  TM Distance: >3 FB Neck ROM: Full    Dental  (+) Teeth Intact, Caps   Pulmonary sleep apnea and Continuous Positive Airway Pressure Ventilation ,    Pulmonary exam normal breath sounds clear to auscultation       Cardiovascular hypertension, Pt. on medications and Pt. on home beta blockers Normal cardiovascular exam Rhythm:Regular Rate:Normal     Neuro/Psych Benign essential tremor Right CTS negative psych ROS   GI/Hepatic negative GI ROS, Neg liver ROS,   Endo/Other  Hyperlipidemia  Renal/GU negative Renal ROS   BPH    Musculoskeletal  (+) Arthritis , Osteoarthritis,    Abdominal   Peds  Hematology negative hematology ROS (+)   Anesthesia Other Findings   Reproductive/Obstetrics                           Anesthesia Physical Anesthesia Plan  ASA: II  Anesthesia Plan: MAC and Bier Block and Bier Block-LIDOCAINE ONLY   Post-op Pain Management:    Induction:   PONV Risk Score and Plan: 1 and Propofol infusion, Ondansetron and Treatment may vary due to age or medical condition  Airway Management Planned: Natural Airway, Nasal Cannula and Simple Face Mask  Additional Equipment:   Intra-op Plan:   Post-operative Plan:   Informed Consent: I have reviewed the patients History and Physical, chart, labs and discussed the procedure including the risks, benefits and alternatives for the proposed anesthesia with the patient or authorized representative who has indicated his/her understanding and acceptance.   Dental advisory given  Plan Discussed with: CRNA and Surgeon  Anesthesia Plan Comments:         Anesthesia Quick  Evaluation

## 2017-10-15 NOTE — Transfer of Care (Signed)
Immediate Anesthesia Transfer of Care Note  Patient: Nicholas Robbins  Procedure(s) Performed: RIGHT CARPAL TUNNEL RELEASE (Right Wrist)  Patient Location: PACU  Anesthesia Type:MAC and Bier block  Level of Consciousness: awake, alert  and oriented  Airway & Oxygen Therapy: Patient Spontanous Breathing and Patient connected to face mask oxygen  Post-op Assessment: Report given to RN and Post -op Vital signs reviewed and stable  Post vital signs: Reviewed and stable  Last Vitals:  Vitals Value Taken Time  BP    Temp    Pulse    Resp    SpO2      Last Pain:  Vitals:   10/15/17 0949  TempSrc: Oral  PainSc: 0-No pain      Patients Stated Pain Goal: 0 (25/85/27 7824)  Complications: No apparent anesthesia complications

## 2017-10-15 NOTE — Anesthesia Postprocedure Evaluation (Signed)
Anesthesia Post Note  Patient: [name]  Procedure(s) Performed: RIGHT CARPAL TUNNEL RELEASE (Right Wrist)     Patient location during evaluation: PACU Anesthesia Type: MAC and Bier Block Level of consciousness: awake and alert and oriented Pain management: pain level controlled Vital Signs Assessment: post-procedure vital signs reviewed and stable Respiratory status: nonlabored ventilation, respiratory function stable and spontaneous breathing Cardiovascular status: blood pressure returned to baseline and stable Postop Assessment: no apparent nausea or vomiting Anesthetic complications: no    Last Vitals:  Vitals:   10/15/17 1045 10/15/17 1100  BP: 124/74 108/80  Pulse: 63 62  Resp: 10 10  Temp: 36.8 C   SpO2: 97% 97%    Last Pain:  Vitals:   10/15/17 1100  TempSrc:   PainSc: 0-No pain                 [name]

## 2017-10-15 NOTE — Brief Op Note (Signed)
10/15/2017  10:45 AM  PATIENT:  Nicholas Robbins  84 y.o. male  PRE-OPERATIVE DIAGNOSIS:  RIGHT CARPAL TUNNEL SYNDROME  POST-OPERATIVE DIAGNOSIS:  RIGHT CARPAL TUNNEL SYNDROME  PROCEDURE:  Procedure(s): RIGHT CARPAL TUNNEL RELEASE (Right)  SURGEON:  Surgeon(s) and Role:    * Cindee SaltKuzma, Merlina Marchena, MD - Primary  PHYSICIAN ASSISTANT:   ASSISTANTS: none   ANESTHESIA:   local, regional and IV sedation  EBL:  5 mL   BLOOD ADMINISTERED:none  DRAINS: none   LOCAL MEDICATIONS USED:  BUPIVICAINE   SPECIMEN:  No Specimen  DISPOSITION OF SPECIMEN:  N/A  COUNTS:  YES  TOURNIQUET:   Total Tourniquet Time Documented: Upper Arm (Right) - 19 minutes Total: Upper Arm (Right) - 19 minutes   DICTATION: .Reubin Milanragon Dictation  PLAN OF CARE: Discharge to home after PACU  PATIENT DISPOSITION:  PACU - hemodynamically stable.

## 2017-10-15 NOTE — Op Note (Signed)
Preoperative diagnosis: Carpal tunnel syndrome right hand  Postoperative diagnosis: Same  Operation: Carpal tunnel release right hand  Surgeon: Cindee SaltGary Sienna Stonehocker  Assistant: None  [facility]  History: The patient is an 84 year old male with a history of numbness and tingling of his right hand.  This is not responded to conservative treatment nerve conduction is been done revealing a motor delay of 1210.9.  Sensory delay is present indicative of carpal tunnel syndrome severe in nature.  He is desirous of having this surgically released and try to regain some sensibility of his fingertips.  Pre-peri-and postoperative course been discussed along with risks and complications.  He is aware that there is no guarantee to the surgery the possibility of infection recurrence injury to arteries nerves tendons and complete relief symptoms and dystrophy.  In the preoperative area the patient is seen extremity marked by both patient and surgeon antibiotic given.  Procedure: The patient is brought to the operating room where a forearm-based IV regional anesthetic was carried out without difficulty under the direction the anesthesia department.  Was prepped using ChloraPrep in the supine position with the right arm free.  3-minute dry time was allowed and a timeout taken to confirm patient procedure.  A longitudinal incision was made in the right palm carried down to subcutaneous tissue.  Bleeders were electrocauterized with bipolar.  The palmar fascia was split.  The superficial palmar arch was identified along with the flexor tendon of the ring little finger.  To the ulnar side of the median nerve the carpal retinaculum was incised with sharp dissection after retracting median nerve radially and the ulnar nerve ulnarly.  A right angled saw retractor placed between skin and forearm fascia.  Deep structures were dissected free with blunt dissection.  The proximal aspect of the flexor retinaculum  distal forearm fascia was then released for approximately 2 cm proximal to the wrist crease under direct vision.  The canal was explored.  An area compression of the nerve was apparent.  The motor branch entered into the retinaculum centrally.  No further lesions were identified.  The wound was copiously irrigated with saline.  The skin was then closed interrupted 4 nylon sutures.  Local infiltration quarter percent bupivacaine without epinephrine was given.  Approximately 8 cc was used.  Sterile compressive dressing with the fingers free was applied.  Inflation of the tourniquet all fingers immediately pink.  He was taken to the recovery room for observation in satisfactory condition.  [facility] will be [facility] 1 week on Norco.  Try Tylenol ibuprofen the Norco will be used for breakthrough.

## 2017-10-15 NOTE — Discharge Instructions (Addendum)

## 2017-10-15 NOTE — H&P (Signed)
Nicholas Robbins is an 84 y.o. male.   Chief Complaint: numbness right hand [id number] is a 84 year old left-hand-dominant male referred by Dr. Roxan Diesel for consultation regarding weakness pain numbness and tingling in his right hand pain is relatively minor the numbness and tingling is more major form along with the weakness. A he is is scoliosis he uses a cane in his right hand. He is done this for approximately a year and a half. He states that all fingers are numb. Does not awaken him at night. He has no history of injury to the hand or to the neck. He has seen a chiropractor in the past for his neck. He states that the numbness and tingling is constant only on his right side. He has not had any treatment for this. Is noticed that the muscles of his thumb have wasted. Feels that it is due to pressure from his crutch. He has no history of diabetes thyroid problems arthritis or gout. Family history is positive arthritis and gout negative for diabetes and thyroid problems.He was referred for nerve conductions with Dr. Riccardo Dubin. These have been done revealing a carpal tunnel syndrome on his right side with a motor delay of 10.9 no sensory response. He also shows a C7-8 radiculopathy.           Past Medical History:  Diagnosis Date  . Arthritis   . Benign essential tremor   . BPH (benign prostatic hyperplasia)   . Colon polyps   . Diverticulitis   . Glaucoma   . Hypertension   . OSA on CPAP    use CPAP nightly    Past Surgical History:  Procedure Laterality Date  . BACK SURGERY     lumbar fusion  . broken ankle  1961  . HERNIA REPAIR    . REPLACEMENT TOTAL KNEE  2005  . VITRECTOMY Left 2006    Family History  Problem Relation Age of Onset  . Ulcers Father 69       bleeding/ deceased  . Lymphoma Brother   . Breast cancer Sister   . Colon cancer Neg Hx   . Stomach cancer Neg Hx   . Rectal cancer Neg Hx   . Esophageal cancer Neg Hx   . Liver cancer Neg Hx    Social History:   reports that he has never smoked. He has never used smokeless tobacco. He reports that he drinks alcohol. He reports that he does not use drugs.  Allergies: No Known Allergies  No medications prior to admission.    No results found for this or any previous visit (from the past 48 hour(s)).  No results found.   Pertinent items are noted in HPI.  Height 5\' 8"  (1.727 m), weight 83.9 kg (185 lb).  General appearance: alert, cooperative and appears stated age Head: Normocephalic, without obvious abnormality Neck: no JVD Resp: clear to auscultation bilaterally Cardio: regular rate and rhythm, S1, S2 normal, no murmur, click, rub or gallop GI: soft, non-tender; bowel sounds normal; no masses,  no organomegaly Extremities: numbness right hand Pulses: 2+ and symmetric Skin: Skin color, texture, turgor normal. No rashes or lesions Neurologic: Grossly normal Incision/Wound: na  Assessment/Plan Assessment:  1. Carpal tunnel syndrome of right wrist    Plan: Have discussed with him the possibility of a double crush. We recommend proceeding however with carpal tunnel release. He will contact Dr. Danielle Dess following this for the possibility of reexamination of his neck. Pre-peri-and postoperative course are discussed along with risks  and complications. He is advised there is no guarantee to the surgery the possibility of infection recurrence injury to arteries nerves tendons complete relief symptoms dystrophy. He is advised that we cannot guarantee any significant improvement in his symptoms. He is advised that there is a double crush this may be more of a problem from his neck than his hand but he does have significantly positive nerve conductions. Would like to proceed. Questions are encouraged and answered to his satisfaction. The schedule as an outpatient under regional anesthesia cart right carpal tunnel release.      Nicholas Robbins R 10/15/2017, 5:39 AM

## 2017-10-16 ENCOUNTER — Encounter (HOSPITAL_BASED_OUTPATIENT_CLINIC_OR_DEPARTMENT_OTHER): Payer: Self-pay | Admitting: Orthopedic Surgery

## 2018-04-13 ENCOUNTER — Encounter: Payer: Self-pay | Admitting: Neurology

## 2018-04-15 ENCOUNTER — Ambulatory Visit: Payer: Medicare Other | Admitting: Neurology

## 2018-04-15 ENCOUNTER — Encounter: Payer: Self-pay | Admitting: Neurology

## 2018-04-15 VITALS — BP 118/71 | HR 62 | Ht 68.0 in | Wt 190.0 lb

## 2018-04-15 DIAGNOSIS — G4733 Obstructive sleep apnea (adult) (pediatric): Secondary | ICD-10-CM

## 2018-04-15 DIAGNOSIS — Z9989 Dependence on other enabling machines and devices: Secondary | ICD-10-CM | POA: Diagnosis not present

## 2018-04-15 DIAGNOSIS — G20 Parkinson's disease: Secondary | ICD-10-CM

## 2018-04-15 NOTE — Progress Notes (Signed)
SLEEP MEDICINE CLINIC   Provider:  Melvyn Novas, MD   Referring Provider: Rodrigo Ran, MD Primary Care Physician:  Rodrigo Ran, MD  Chief Complaint  Patient presents with  . Follow-up    pt alone, rm 10. pt states no issues or concerns with the machine. DME [facility]    HPI:  Nicholas Robbins is a 84 y.o. male Nicholas Robbins and  seen here on 04-15-2018 , this time for Parkinson's disease and OSA on CPAP- both.  I have noticed that Arelia Longest 's resting tremor of the left hand has become more pronounced with high amplitude, the left shoulder is droopier and the overall muscle tone of the left upper extremity shows more rigidity, there is slightly less facial expression and there is a hoarseness of the voice which has been present for a long time.  But he also blinks very rarely.  He reports no dysphagia.  He remains ambulatory with a cane or walker and was in the building will use a walker. He walks with a rather stooped. Loss of arm swing left over right.  He reports his 77 year old sister has been hospitalized with Pneumonia and "wants to die"- he may be flying to see her in [place], [place]. .    HPI:  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 He needs to establish himself with a new DME and sleep clinic. Dr Waynard Edwards referred him.  He received his diagnosis of obstructive sleep apnea approximately 5 years ago and has used CPAP nightly. He carries the diagnosis of hypertension and has been treated with medications since 2000 he has benign prostate hyperplasia, and a coronary arthrosclerosis was diagnosed by chest CT in generally 2015. Dilated pulmonary arteries in generally 15 suspect that this is remote pulmonary embolism. And he had whooping cough as a child. His usual bedtime is 10:30 PM, he will promptly go to sleep within 5-10 minutes. He lives alone and there is no report of snoring  but he does not feel that there is any effect of this being present. He wakes up usually restored or refreshed and has the most energy in the morning. His overall nocturnal sleep time is more than 6 hours and he rises at 5:30 AM regularly. He sets breakfasts for the guess that the pain center the morning has this as a routine as well, he drinks 2 cups of coffee in the morning, very rarely any daytime caffeine especially not in the form of soda. Every other day he will get a daytime nap of 30 minutes duration. A power nap. And he feels that this is refreshing. He is using a nasal CPAP mask interface. This has been described as comfortable or at least not bothersome to him. He has 2 nocturias at night. No trauma to neck or face, no tonsllectomy or nasal surgery. No TBI.  We were able today to get a compliance report from Nicholas Lenart's CPAP machine and it shows 100% compliance. He has used the machine on average 7 hours and 57 minutes at night he uses a ramp start at 4 cm water and a CPAP pressure of 5.5 cm water. His overall AHI also known as the apnea hypotony index was not elicited by this reading. There has also been no alarm set for air leakage. He has a C-Flex setting of 2 cm water.   10-09-14 : Continues to  work at [employer], [address].  He uses CPAP faithfully- 100% over 4 hours, residual AHI 2 at 5.5 cm water, very low.   04-24-15: I reviewed today the CPAP download including the days of 12-20 6-16. The patient again has 100% compliance and a correlative usage of over 8 days and 22 hours. Average daily usage is 7 hours and 9 minutes. The patient is highly compliant but his residual AHI is not visible-  He is on a C-Flex setting of 2 cm water. He sleeps on average about 7 hours at night, going to bed at 10:30 and waking up at 5:30. He falls asleep promptly. I have the pleasure of seeing [name] today, for a routine compliance visit in our CPAP clinic. The patient continues to use  CPAP at 100% compliance with 7 hours and 8 minutes on average nightly use, his CPAP setting is at only 5.5 cm. His residual AHI is again not visible. The patient is using a REMstar plus machine. He was already  Diagnosed 7 or 8 years ago and this is his original machine. It would not satisfy today standard of therapeutic data collection. The patient reports some daytime sleepiness at 12 out of 24 points and fatigue at 31 out of a possible 53 points. He does not endorse depression. My goal would be to retest [name] see if he still has apnea since he is doing well on a very low pressure. He has lost some weight. If his AHI is higher than 8 I would continue treatment with CPAP.   07-13-2017. Nicholas Robbins, interval history from 13 July 2017 Nicholas Robbins indicates that he is very happy with his new CPAP machine, no longer misses his old one, he underwent a 30-day continued auto titration between a minimum pressure setting of 5.6 and a maximum of 16 cm water.  He is 100% compliant CPAP user with the average use of time being 7 hours and 8 minutes each night, the EPR level is set at 1 cm, his 95th percentile pressure is 9.2 and his residual AHI is 3.4, this is very good.  He has some central apneas emerging however he seems to be able to use his CPAP throughout the night and actually sleep well.  His Epworth sleepiness score is endorsed at 15 points today, fatigue severity at 25 points. The  geriatric depression score is indicated at 2 out of 15 points . Sleep habits are unchanged.   10-13-2017, Nicholas Robbins presents for a follow up- he has more rigor, more masked facial expression, more dysphonia. He looks very parkinsonian now and has trouble with handwriting, due to tremor in his left dominant hand.  Left shoulder is droopy, he has lost muscle bulk bilaterally in the hypothenar eminence. He plans for a carpal tunnel surgery. He has not tried Primidone as it may leave him drowsy. He has not taken sinemet,  - he  felt no response after 2 days (!) . I urged him to try again.  He walks with a cane, and since he carries a cane in his right hand it may have contributed a little bit to pressure on the wrist.  He was also reporting that he for long time had degenerative changes in his neck spine and may need a imaging study to rule out a spinal stenosis or radiculopathy at that level.   He has been highly compliant as usually with a CPAP, 100% compliance 7 hours and 6 minutes his average nightly  use of time.  Minimum pressure 5.6 maximum pressure of 16 cmH2O 1 cm EPR and a residual AHI of 1.3.  Excellent resolution.      Review of Systems: Epworth sleepiness score is endorsed at 15 points today,  He can fall asleep very easily. His fatigue severity at 25 points. The  geriatric depression score is indicated at 2 out of 15 points .  Out of a complete 14 system review, the patient complains of only the following symptoms, and all other reviewed systems are negative. Single, master's degree , religous studies. 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  Schedules naps, has driving sleepiness problems- especially driving after lunch.  T akes 15 minute power naps, many.  Vivid dreams. No witness to his sleep.  Has ben given Modafinil and not yet used it.       Family History  Problem Relation Age of Onset  . Ulcers Nicholas 29       bleeding/ deceased  . Lymphoma Brother   . Breast cancer Sister   . Colon cancer Neg Hx   . Stomach cancer Neg Hx   . Rectal cancer Neg Hx   . Esophageal cancer Neg Hx   . Liver cancer Neg Hx     Past Medical History:  Diagnosis Date  . Arthritis   . Benign essential tremor   . BPH (benign prostatic hyperplasia)   . Colon polyps   . Diverticulitis   . Glaucoma   . Hypertension   . OSA on CPAP    use CPAP nightly    Past Surgical History:  Procedure Laterality Date  . BACK SURGERY     lumbar fusion  . broken ankle  1961  . CARPAL TUNNEL RELEASE Right 10/15/2017   Procedure:  RIGHT CARPAL TUNNEL RELEASE;  Surgeon: Cindee Salt, MD;  Location: [facility];  Service: Orthopedics;  Laterality: Right;  . HERNIA REPAIR    . REPLACEMENT TOTAL KNEE  2005  . VITRECTOMY Left 2006    Current Outpatient Medications  Medication Sig Dispense Refill  . aspirin 81 MG tablet Take 81 mg by mouth daily.    Marland Kitchen BENICAR 20 MG tablet Take 20 mg by mouth daily.  3  . carbidopa-levodopa (SINEMET IR) 25-100 MG tablet Take 1 tablet by mouth 3 (three) times daily. 90 tablet 2  . carvedilol (COREG) 6.25 MG tablet Take 6.25 mg by mouth 2 (two) times daily.  2  . COD LIVER OIL PO Take by mouth daily at 2 PM. One teaspoon    . fexofenadine (ALLEGRA) 180 MG tablet Take 180 mg by mouth daily.    . finasteride (PROSCAR) 5 MG tablet TAKE 1 TABLET DAILY AS DIRECTED.  3  . fluocinonide (LIDEX) 0.05 % external solution Apply 1 application topically 2 (two) times daily. Instil 2 gtts in ear twice weekly    . folic acid (FOLVITE) 800 MCG tablet Take 400 mcg by mouth daily.    . Garlic 1000 MG CAPS Take 1,000 mg by mouth 4 (four) times daily.    Marland Kitchen glucosamine-chondroitin 500-400 MG tablet Take 1 tablet by mouth 2 (two) times daily.    . hyoscyamine (LEVSIN SL) 0.125 MG SL tablet Place 0.125 mg under the tongue every 8 (eight) hours as needed for cramping.    . modafinil (PROVIGIL) 100 MG tablet TAKE 1 TABLET BY MOUTH EVERY DAY    . Multiple Vitamin (MULTIVITAMIN) tablet Take 1 tablet by mouth daily. Shaklee vitamin w/o iron    .  Multiple Vitamins-Minerals (OCUVITE PRESERVISION PO) Take 1 tablet by mouth 2 (two) times daily.    . NON FORMULARY Juice Plus Orchard Blend  2 daily and Garden Blend 2 daily    . pravastatin (PRAVACHOL) 40 MG tablet Take 40 mg by mouth at bedtime.  2  . Probiotic Product (ALIGN PO) Take by mouth.    . silodosin (RAPAFLO) 8 MG CAPS capsule Take 8 mg by mouth daily with breakfast.    . Travoprost, BAK Free, (TRAVATAN) 0.004 % SOLN ophthalmic solution Place 1 drop  into both eyes at bedtime.    . triamcinolone (NASACORT ALLERGY 24HR) 55 MCG/ACT AERO nasal inhaler Place 2 sprays into the nose daily.    Marland Kitchen. UNABLE TO FIND Take 1 capsule by mouth 4 (four) times daily. Med Name: Juiceplus    . vitamin B-12 (CYANOCOBALAMIN) 1000 MCG tablet Take 1,000 mcg by mouth daily.     No current facility-administered medications for this visit.     Allergies as of 04/15/2018  . (No Known Allergies)    Vitals: BP 118/71   Pulse 62   Ht 5\' 8"  (1.727 m)   Wt 190 lb (86.2 kg)   BMI 28.89 kg/m  Last Weight:  Wt Readings from Last 1 Encounters:  04/15/18 190 lb (86.2 kg)       Last Height:   Ht Readings from Last 1 Encounters:  04/15/18 5\' 8"  (1.727 m)    Physical exam:  General: The patient is awake, alert and appears not in acute distress. The patient is well groomed. Head: Normocephalic, atraumatic. Neck is supple. Mallampati 3.  neck circumference: 16. Nasal airflow congested, TMJ is by click on the right jaw evident . Retrognathia is not seen.  Cardiovascular:  Regular rate and rhythm, without  murmurs or carotid bruit, and without distended neck veins. Respiratory: Lungs are clear to auscultation. Skin:  Without evidence of edema, or rash.  Trunk: scoliosis, lumbar fusion, hip pain.  Left shoulder drooping.  Neurologic exam :The patient is awake and alert, oriented to place and time.   Memory subjective described as intact. There is a normal attention span & concentration ability.Speech is fluent without dysarthria, but there is now clearly dysphonia or aphasia.  Mood and affect are appropriate.  Cranial nerves: Pupils are equal and unreactive to light. Funduscopic exam with evidence of a air-bubble - and  bilaterally cataract related lens replacement , no nystagmus. Visual fields by finger perimetry are intact. Hearing to finger rub intact. Facial sensation intact to fine touch. Facial motor strength is symmetric and tongue and uvula move  midline.  There is a droopy shoulder on the left, this is worsened over 6 month, shoulder rigor, arm muscle tone and tremor in the left upper extremity. He has a resting tremor on the left, pill rolling. Mild cog-wheeling on both biceps, recent fall.      Assessment:  After physical and neurologic examination, review of laboratory studies, imaging, neurophysiology testing and pre-existing records, assessment is :  1) OSA diagnosed 2010 in [place] - degree of apnea not known, he was told he was " severe " .   Repeated sleep study from 11-18-2016 confirmed apnea and he was put on auto CPAP>  Doing well on 5.6 cm through 16 cm water with 1 cm EPR and few central apneas.  (1.2), total AHI 3.4/hr.  2) was concerned about early PD  - I now think the diagnosis is made.     Visit duration was 30minutes.  More than 50% of the time were reserved for information, discussion of treatment option.   Plan:  Treatment plan and additional workup :   Patient with OSA by history, compliant 100% on CPAP.  No changes needed. His august 2018 sleep study and re-titration confirmed good resolution.   Rhinitis treated with Nasocort, but little reduction. Try Zyrtec daily.   PD- I give him a Sinemet 25/ 100 mg up to TID, trial with intake 1 hour before mass- that's when the tremor is most noticeable.   He has  To start Sinemet tid, 30 minutes before a meal, and if partial relief will increase to quid with an extra dose at bedtime.    Rv with CPAP yearly. He had complaint of excessive daytime sleepiness, I suggested modafinil 100 mg tab. - [facility]     Melvyn NovasCarmen Chevelle Coulson MD   04-15-2018    04/15/2018

## 2018-04-15 NOTE — Patient Instructions (Signed)
Parkinson Disease  Parkinson disease is a long-term (chronic) condition that gets worse over time (is progressive). Parkinson disease limits your ability to control your movements and move your body normally. This condition is a type of movement disorder.  Each person with Parkinson disease is affected differently. The condition can range from mild to severe. Parkinson disease tends to progress slowly over several years.  What are the causes?  Parkinson disease results from a loss of brain cells (neurons) in a specific part of the brain (substantia nigra). Some of the neurons in the substantia nigra make an important brain chemical (dopamine). Dopamine is needed to control movement. As the condition gets worse, neurons make less dopamine. This makes it hard to move or control your movements.  The exact cause of why neurons are lost or produce less dopamine is not known. Genetic and environmental factors may contribute to the cause of Parkinson disease.  What increases the risk?  This condition is more likely to develop in:   Men.   People who are 60 years of age or older.   People who have a family history of Parkinson disease.  What are the signs or symptoms?  Symptoms of this condition can vary from person to person. The main (primary) symptoms are related to movement (motor symptoms). These include:   Uncontrolled shaking movements (tremor). Tremors usually start in a hand or foot when you are resting (resting tremor). The tremor may stop when you move around.   Slowing of movement. You may lose facial expression and have trouble making the small movements that are needed to button clothing or brush your teeth. You may walk with short, shuffling steps.   Stiff movement (rigidity). This mostly affects your arms, legs, neck, and upper body. You may walk without swinging your arms. Rigidity can be painful.   Loss of balance and stability when standing. You may sway, fall backward, and have trouble making  turns.  Secondary motor symptoms of this condition include:   Shrinking handwriting.   Stooped posture.   Slowed speech.   Trouble swallowing.   Drooling.   Sexual dysfunction.   Muscle cramps.   Loss of smell.  Additional symptoms that are not related to movement include:   Constipation.   Mood swings.   Depression or anxiety.   Sleep disturbances.   Confusion.   Loss of mental abilities (dementia).   Low blood pressure.   Trouble concentrating.  How is this diagnosed?  Parkinson disease can be hard to diagnose in its early stages. A diagnosis may be made based on symptoms, a medical history, and physical exam. During your exam, your health care provider will look for:   Lack of facial expression.   Resting tremor.   Stiffness in your neck, arms, and legs.   Abnormal walk.   Trouble with balance.  You may have brain imaging tests done to check for a loss of dopamine-producing areas of the brain. Your healthcare provider may also grade the severity of your condition as mild, moderate, or advanced. Parkinson disease progression is different for everyone. You may not progress to the advanced stage.  Mild Parkinson disease involves:   Movement problems that do not affect daily activities.   Movement problems on one side of the body.   Movement problems that are controlled with medicines.   Good response to exercise.  Moderate Parkinson disease involves:   Movement problems on both sides of the body.   Slowing of movement.     Coordination and balance problems.   Less of a response to medicine.   More side effects from medicines.  Advanced Parkinson disease involves:   Extreme difficulty walking.   Inability to live alone safely.   Signs of dementia.   Difficulty controlling symptoms with medicine.  How is this treated?  There is no cure for Parkinson disease. Treatment focuses on relieving your symptoms. Treatment may include:   Medicines.   Speech, occupational, and physical  therapy.   Surgery.  Everyone responds to medicines differently. Your response may change over time. Work with your health care provider to find the best medicines for you. These may include:   Dopamine replacement drugs. These are the most effective medicines. A long-term side effect of these medicines is uncontrolled movements (dyskinesias).   Dopamine agonists. These drugs act like dopamine to stimulate dopamine receptors in the brain. Side effects include nausea and sleepiness, but they cause less dyskinesia.   Other medicines to reduce tremor, prevent dopamine breakdown, reduce dyskinesia, and reduce dementia that is related to Parkinson disease.  Another treatment is deep brain stimulation surgery to reduce tremors and dyskinesia. This procedure involves placing electrodes in the brain. The electrodes are attached to an electric pulse generator that acts like a pacemaker for your brain. This may be an option if you have had the condition for at least four years and are not responding well to medicines.  Follow these instructions at home:          Take over-the-counter and prescription medicines only as told by your health care provider.   Install grab bars and railings in your home to prevent falls.   Follow instructions from your health care provider about eating or drinking restrictions.   Return to your normal activities as told by your health care provider. Ask your health care provider what activities are safe for you.   Get regular exercise as told by your health care provider or a physical therapist.   Keep all follow-up visits as told by your health care provider. This is important. These include any visits with a speech therapist or occupational therapist.   Consider joining a support group for people with Parkinson disease.  Contact a health care provider if:   Medicines do not help your symptoms.   You are unsteady or have fallen at home.   You need more support to function well at  home.   You have trouble swallowing.   You have severe constipation.   You are struggling with side effects from your medicines.   You see or hear things that are not real (hallucinate).   You feel confused, anxious, or depressed.  Get help right away if:   You are injured after a fall.   You cannot swallow without choking.   You have chest pain or trouble breathing.   You do not feel safe at home.  This information is not intended to replace advice given to you by your health care provider. Make sure you discuss any questions you have with your health care provider.  Document Released: 04/11/2000 Document Revised: 09/17/2015 Document Reviewed: 02/02/2015  Elsevier Interactive Patient Education  2019 [employer]

## 2018-04-17 ENCOUNTER — Other Ambulatory Visit: Payer: Self-pay | Admitting: Neurology

## 2018-05-07 DIAGNOSIS — R55 Syncope and collapse: Secondary | ICD-10-CM

## 2018-05-07 HISTORY — DX: Syncope and collapse: R55

## 2018-05-17 ENCOUNTER — Other Ambulatory Visit: Payer: Self-pay | Admitting: Neurology

## 2018-06-08 DIAGNOSIS — R55 Syncope and collapse: Secondary | ICD-10-CM

## 2018-06-14 ENCOUNTER — Telehealth: Payer: Self-pay | Admitting: Cardiology

## 2018-06-14 ENCOUNTER — Other Ambulatory Visit: Payer: Self-pay | Admitting: Cardiology

## 2018-06-14 DIAGNOSIS — Z9989 Dependence on other enabling machines and devices: Secondary | ICD-10-CM

## 2018-06-14 DIAGNOSIS — I4729 Other ventricular tachycardia: Secondary | ICD-10-CM

## 2018-06-14 DIAGNOSIS — I453 Trifascicular block: Secondary | ICD-10-CM

## 2018-06-14 DIAGNOSIS — I209 Angina pectoris, unspecified: Secondary | ICD-10-CM

## 2018-06-14 DIAGNOSIS — R55 Syncope and collapse: Secondary | ICD-10-CM

## 2018-06-14 DIAGNOSIS — I472 Ventricular tachycardia: Secondary | ICD-10-CM

## 2018-06-14 DIAGNOSIS — G4733 Obstructive sleep apnea (adult) (pediatric): Secondary | ICD-10-CM

## 2018-06-14 DIAGNOSIS — I10 Essential (primary) hypertension: Secondary | ICD-10-CM

## 2018-06-14 NOTE — Progress Notes (Signed)
Event Monitor 30 days 05/10/2018: Predominant rhythm is sinus rhythm with first-degree AV block. No symptoms reported. One 4 beat VT at 8:46PM, Occasional PVCs.  Will schedule him for a nuclear stress to exclude ischemic etiology. If unable to exercise, will do Lexi. Also repeat echocardiogram

## 2018-06-14 NOTE — Telephone Encounter (Signed)
Event Monitor 30 days 05/10/2018: Predominant rhythm is sinus rhythm with first-degree AV block. No symptoms reported. One 4 beat VT at 8:46PM, Occasional PVCs.  Will schedule him for a nuclear stress to exclude ischemic etiology. If unable to exercise, will do Lexi. Also repeat echocardiogram 

## 2018-06-15 NOTE — Telephone Encounter (Signed)
Please schedule appt for patient, he will call later today to do so.

## 2018-06-20 ENCOUNTER — Other Ambulatory Visit: Payer: Self-pay | Admitting: Neurology

## 2018-06-21 ENCOUNTER — Ambulatory Visit: Payer: Medicare Other

## 2018-06-21 DIAGNOSIS — R55 Syncope and collapse: Secondary | ICD-10-CM

## 2018-06-21 DIAGNOSIS — I200 Unstable angina: Secondary | ICD-10-CM

## 2018-06-21 DIAGNOSIS — I472 Ventricular tachycardia: Secondary | ICD-10-CM

## 2018-06-21 DIAGNOSIS — I4729 Other ventricular tachycardia: Secondary | ICD-10-CM

## 2018-06-21 DIAGNOSIS — I209 Angina pectoris, unspecified: Secondary | ICD-10-CM

## 2018-06-22 ENCOUNTER — Encounter: Payer: Self-pay | Admitting: Cardiology

## 2018-06-22 ENCOUNTER — Ambulatory Visit: Payer: Medicare Other | Admitting: Cardiology

## 2018-06-22 DIAGNOSIS — R9439 Abnormal result of other cardiovascular function study: Secondary | ICD-10-CM

## 2018-06-22 DIAGNOSIS — E785 Hyperlipidemia, unspecified: Secondary | ICD-10-CM

## 2018-06-22 DIAGNOSIS — G4733 Obstructive sleep apnea (adult) (pediatric): Secondary | ICD-10-CM | POA: Diagnosis not present

## 2018-06-22 DIAGNOSIS — I4729 Other ventricular tachycardia: Secondary | ICD-10-CM

## 2018-06-22 DIAGNOSIS — E7800 Pure hypercholesterolemia, unspecified: Secondary | ICD-10-CM | POA: Diagnosis not present

## 2018-06-22 DIAGNOSIS — I472 Ventricular tachycardia: Secondary | ICD-10-CM

## 2018-06-22 DIAGNOSIS — I453 Trifascicular block: Secondary | ICD-10-CM

## 2018-06-22 DIAGNOSIS — Z9989 Dependence on other enabling machines and devices: Secondary | ICD-10-CM

## 2018-06-22 DIAGNOSIS — R55 Syncope and collapse: Secondary | ICD-10-CM | POA: Diagnosis not present

## 2018-06-22 DIAGNOSIS — I2584 Coronary atherosclerosis due to calcified coronary lesion: Secondary | ICD-10-CM

## 2018-06-22 DIAGNOSIS — I10 Essential (primary) hypertension: Secondary | ICD-10-CM | POA: Diagnosis not present

## 2018-06-22 DIAGNOSIS — I209 Angina pectoris, unspecified: Secondary | ICD-10-CM

## 2018-06-22 DIAGNOSIS — I2510 Atherosclerotic heart disease of native coronary artery without angina pectoris: Secondary | ICD-10-CM

## 2018-06-22 HISTORY — DX: Ventricular tachycardia: I47.2

## 2018-06-22 HISTORY — DX: Other ventricular tachycardia: I47.29

## 2018-06-22 HISTORY — DX: Pure hypercholesterolemia, unspecified: E78.00

## 2018-06-22 HISTORY — DX: Trifascicular block: I45.3

## 2018-06-22 HISTORY — DX: Essential (primary) hypertension: I10

## 2018-06-22 NOTE — Progress Notes (Signed)
Subjective:  Primary Physician:  Crist Infante, MD  Patient ID: Nicholas Robbins, male    DOB: 08-May-1928, 85 y.o.   MRN: [id number]  No chief complaint on file.   HPI: Nicholas Robbins  is a 85 y.o. male  with coronary calcification by CT angiogram of the chest in 2016 and angina pectoris and abnormal nuclear stress in 2016, was been doing well without recurrence of angina since being on medical therapy. Patient had an episode of syncope on 05/07/2018 Nicholas Robbins was sitting and talking to his friend suddenly passed out. No recurrence since then. Nicholas Robbins was evaluated by his PCP and carvedilol was discontinued.  Nicholas Robbins was seen by me on 05/10/18 and I had referred him for a 30 day event monitor in view of his underlying trifascicular block,  I have advised him not to drive for now until at least a event monitor reports are available. I had felt the symptoms may have been related to either orthostasis or could've been related to high degree AV block. Nicholas Robbins has been compliant on CPAP.  I had planned on obtaining an event monitor 1st and then decide whether Nicholas Robbins will need repeat stress testing or repeat echocardiogram.  Due to NSVT noted on event monitor, I did perform Lexiscan Myoview stress test.  Nicholas Robbins now presents for follow-up.  Fortunately Nicholas Robbins has not had any further episodes of dizziness or syncope.  Denies chest pain or palpitations.  Past Medical History:  Diagnosis Date  . Arthritis   . Benign essential tremor   . BPH (benign prostatic hyperplasia)   . Colon polyps   . Diverticulitis   . Essential hypertension 06/22/2018  . Glaucoma   . Hypercholesterolemia 06/22/2018  . Hypertension   . NSVT (nonsustained ventricular tachycardia) ([place]) 06/22/2018   Event Monitor 30 days 05/10/2018: Performed for syncope Predominant rhythm is sinus rhythm with first-degree AV block. No symptoms reported. One 4 beat VT at 8:46PM, Occasional PVCs.  . OSA on CPAP    use CPAP nightly  . Syncope and collapse 05/07/2018  .  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 BACK SURGERY     lumbar fusion  . broken ankle  1961  . CARPAL TUNNEL RELEASE Right 10/15/2017   Procedure: RIGHT CARPAL TUNNEL RELEASE;  Surgeon: Daryll Brod, MD;  Location: [facility];  Service: Orthopedics;  Laterality: Right;  . HERNIA REPAIR    . REPLACEMENT TOTAL KNEE  2005  . VITRECTOMY Left 2006    Social History   Socioeconomic History  . Marital status: Single    Spouse name: Not on file  . Number of children: 0  . Years of education: Not on file  . Highest education level: Not on file  Occupational History  . 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 Financial resource strain: Not on file  . Food insecurity:    Worry: Not on file    Inability: Not on file  . Transportation needs:    Medical: Not on file    Non-medical: Not on file  Tobacco Use  . Smoking status: Never Smoker  . Smokeless tobacco: Never Used  Substance and Sexual Activity  . Alcohol use: Yes    Alcohol/week: 0.0 standard drinks    Comment: occasional  . Drug use: No  . Sexual activity: Not on file  Lifestyle  . Physical activity:    Days per week: Not on file  Minutes per session: Not on file  . Stress: Not on file  Relationships  . Social connections:    Talks on phone: Not on file    Gets together: Not on file    Attends religious service: Not on file    Active member of club or organization: Not on file    Attends meetings of clubs or organizations: Not on file    Relationship status: Not on file  . Intimate partner violence:    Fear of current or ex partner: Not on file    Emotionally abused: Not on file    Physically abused: Not on file    Forced sexual activity: Not on file  Other Topics Concern  . Not on file  Social History Narrative   Daily Caffeine.  Lives at home,  With [facility].  Works at [employer].  [employer], post graduate.   Single,  no kids.      Current Outpatient Medications on File Prior to Visit  Medication Sig Dispense Refill  . aspirin 81 MG tablet Take 81 mg by mouth daily.    Marland Kitchen BENICAR 20 MG tablet Take 20 mg by mouth daily.  3  . carbidopa-levodopa (SINEMET IR) 25-100 MG tablet TAKE 1 TABLET BY MOUTH THREE TIMES A DAY 90 tablet 10  . carvedilol (COREG) 6.25 MG tablet Take 6.25 mg by mouth 2 (two) times daily.  2  . COD LIVER OIL PO Take by mouth daily at 2 PM. One teaspoon    . fexofenadine (ALLEGRA) 180 MG tablet Take 180 mg by mouth daily.    . finasteride (PROSCAR) 5 MG tablet TAKE 1 TABLET DAILY AS DIRECTED.  3  . fluocinonide (LIDEX) 0.05 % external solution Apply 1 application topically 2 (two) times daily. Instil 2 gtts in ear twice weekly    . folic acid (FOLVITE) 945 MCG tablet Take 400 mcg by mouth daily.    . Garlic 0388 MG CAPS Take 1,000 mg by mouth 4 (four) times daily.    Marland Kitchen glucosamine-chondroitin 500-400 MG tablet Take 1 tablet by mouth 2 (two) times daily.    . hyoscyamine (LEVSIN SL) 0.125 MG SL tablet Place 0.125 mg under the tongue every 8 (eight) hours as needed for cramping.    . modafinil (PROVIGIL) 100 MG tablet TAKE 1 TABLET BY MOUTH EVERY DAY    . Multiple Vitamin (MULTIVITAMIN) tablet Take 1 tablet by mouth daily. [place] vitamin w/o iron    . Multiple Vitamins-Minerals (OCUVITE PRESERVISION PO) Take 1 tablet by mouth 2 (two) times daily.    . NON FORMULARY Juice Plus Orchard Blend  2 daily and Garden Blend 2 daily    . pravastatin (PRAVACHOL) 40 MG tablet Take 40 mg by mouth at bedtime.  2  . Probiotic Product (ALIGN PO) Take by mouth.    . silodosin (RAPAFLO) 8 MG CAPS capsule Take 8 mg by mouth daily with breakfast.    . Travoprost, BAK Free, (TRAVATAN) 0.004 % SOLN ophthalmic solution Place 1 drop into both eyes at bedtime.    . triamcinolone (NASACORT ALLERGY 24HR) 55 MCG/ACT AERO nasal inhaler Place 2 sprays into the nose daily.    Marland Kitchen UNABLE TO FIND Take 1 capsule by mouth 4  (four) times daily. Med Name: Juiceplus    . vitamin B-12 (CYANOCOBALAMIN) 1000 MCG tablet Take 1,000 mcg by mouth daily.     No current facility-administered medications on file prior to visit.  Review of Systems  Constitutional: Negative for malaise/fatigue and weight loss.  Respiratory: Negative for cough, hemoptysis and shortness of breath.   Cardiovascular: Positive for leg swelling. Negative for chest pain, palpitations and claudication.  Gastrointestinal: Negative for abdominal pain, blood in stool, constipation, heartburn and vomiting.  Genitourinary: Negative for dysuria.  Musculoskeletal: Positive for back pain and joint pain (right ankle from remote injury). Negative for myalgias.  Neurological: Positive for tremors. Negative for dizziness, focal weakness and headaches.  Endo/Heme/Allergies: Does not bruise/bleed easily.  Psychiatric/Behavioral: Negative for depression. The patient is not nervous/anxious.   All other systems reviewed and are negative.      Objective:  There were no vitals taken for this visit. There is no height or weight on file to calculate BMI.  Physical Exam  Constitutional: Nicholas Robbins appears well-developed and well-nourished. No distress.  HENT:  Head: Atraumatic.  Eyes: Conjunctivae are normal.  Neck: Neck supple. No JVD present. No thyromegaly present.  Cardiovascular: Normal rate, regular rhythm, normal heart sounds and intact distal pulses. Exam reveals no gallop.  No murmur heard. Pulmonary/Chest: Effort normal and breath sounds normal.  Abdominal: Soft. Bowel sounds are normal.  Musculoskeletal: Normal range of motion.        General: Edema (trace) present.  Neurological: Nicholas Robbins is alert.  Skin: Skin is warm and dry.  Psychiatric: Nicholas Robbins has a normal mood and affect.    CARDIAC STUDIES:   Event Monitor 30 days 2018/06/06:  Predominant rhythm is sinus rhythm with first-degree AV block. No symptoms reported. One 4 beat VT at 8:46PM, Occasional  PVCs.  Sleep study 03/31/2014 Positive for Sleep Apnea, follows Dr. Paulla Dolly myoview stress test 06/21/2018:  1. Lexiscan stress test was performed. Exercise capacity was not assessed. No stress symptoms reported. Resting blood pressure was 136/72 mmHg and peak effect blood pressure was 140/70 mmHg. The resting and stress electrocardiogram demonstrated normal sinus rhythm, RBBB + LAHB, occasional PVC and normal rest repolarization.  Stress EKG is non diagnostic for ischemia as it is a pharmacologic stress.  2. The overall quality of the study is good. There is no evidence of abnormal lung activity. Stress and rest SPECT images demonstrate homogeneous tracer distribution throughout the myocardium. Gated SPECT imaging reveals mild global decrease in myocardial thickening and wall motion. The left ventricular ejection fraction was normal (46%).   3. High risk study due to reduced LVEF. No evidence of ischemia/ infarction.  Lexiscan myoview 06/16/2014: SPECT images demonstrate a moderate-sized mild ischemia extending from the base towards the apex in the septal wall. Left ventricle systolic function was Calculated at 50%. There was mild septal hypokinesis. This represents an intermediate risk scan.  Assessment & Recommendations:   1. OSA on CPAP  2. Syncope and collapse No further episodes since 05/07/2018   3. Essential hypertension   4. Hypercholesterolemia  5. NSVT (nonsustained ventricular tachycardia) ([facility]) during event monitor Jan 2019, one 4 beat episode. No symptoms reported  6. Trifascicular block  EKG 2018/06/06: Sinus rhythm with first-degree block @ 67 bpm, left axis deviation, left anterior fascicular block. Right bundle branch block. Trifascicular block. Nonspecific T abnormality. No significant change from 03/21/18.  7. Abnormal nuclear stress test  8. Laboratory examination: 05/07/2018: TSH 1.49.  Creatinine 1.2, potassium 4.1, EGFR 57, BMP normal.  WBC 10.9, RBC 3.9,  normal H&H, MCV 99.8, CBC otherwise normal.  Labs 09/01/2017: Serum glucose 112 mg, BUN 16, creatinine 0.9, EGFR greater than 60 ML, potassium 4.6.  HB 12.3/HCT 37.7, platelets  221, normal indicis.  Total cholesterol 153, triglycerides 115, HDL 51, LDL 79.  Non-HDL cholesterol 102.  TSH normal.  Recommendation: Patient has developed a common cold, lungs are clear to examined.  No fever or chills. With regard to syncope, 4 beat NSVT with clinically low risk stress test does not explain progression of CAD.  Nicholas Robbins has been exercising 4 days a week without any chest pain, dizziness or palpitations.  Hence I'll continue observation for now.  Nicholas Robbins does need an echocardiogram to evaluate LV systolic function.  I have discussed with him regarding presence of trifascicular block and probable need for pacemaker in the future if Nicholas Robbins has recurrence of syncope the could consider a loop recorder implantation as event monitor was unyielding.  Blood pressure is well controlled, Nicholas Robbins is on appropriate medical therapy.  I would like to see him back in 3 months. His lipids are also well controlled.  Adrian Prows, MD, [facility] 06/22/2018, [facility]. [place] Pager: [phone] Office: [phone] If no answer Cell [phone]

## 2018-07-12 ENCOUNTER — Ambulatory Visit: Payer: Medicare Other

## 2018-07-12 ENCOUNTER — Other Ambulatory Visit: Payer: Self-pay

## 2018-07-12 DIAGNOSIS — I209 Angina pectoris, unspecified: Secondary | ICD-10-CM

## 2018-07-12 DIAGNOSIS — R55 Syncope and collapse: Secondary | ICD-10-CM

## 2018-07-20 NOTE — Progress Notes (Signed)
Normal heart function. No change from 2016

## 2018-07-21 NOTE — Progress Notes (Signed)
Pt aware.

## 2018-09-02 ENCOUNTER — Telehealth: Payer: Self-pay | Admitting: Neurology

## 2018-09-02 ENCOUNTER — Encounter: Payer: Self-pay | Admitting: Neurology

## 2018-09-02 NOTE — Telephone Encounter (Signed)
Called the patient to inform them that our office has placed new protocols in place for our office visits. Due to Covid 19 our office is reducing our number of office visits in order to minimize the risk to our patients and healthcare providers.Our office is now providing the capability to offer the patients phone visits at this time. Informed of what that process looks like and informed that the phone visit will still be billed through insurance as such. Due to [place],informed the patient since the appointment is taking place over the phone/internet app, we can't guarantee the security of the phone line. With that said if we do move forward I would have to get verbal consent to complete the Phone call. Patient gave verbal consent to move forward with the phone visit. I have reviewed the patient's chart and made sure that everything is up to date. Patient is also made aware that since this is a video visit we are able to complete the visit but a physical exam is not able to be done since the patient is not present in person. Pt request the link be texted/emailed to them. Pt informed that the front staff will contact the patient aprox 30 minutes prior to the scheduled appointment to "check them in" and make sure that everything is ready for the appointment to get started. Pt verbalized understanding of this information and will states to be ready for the visit at least 15-30 min prior to the visit. Reminded the patient once more that this is treated as a Office visit and the patient must be prepared for the visit and ready at the time of their appointment preferably in a well lit area where they have good connection for the visit. Pt verbalized understanding.   Epworth Sleepiness Scale 0= would never doze 1= slight chance of dozing 2= moderate chance of dozing 3= high chance of dozing  Sitting and reading:2 Watching TV:0 Sitting inactive in a public place (ex. Theater or meeting):0 As a passenger in a car  for an hour without a break:1 Lying down to rest in the afternoon:3 Sitting and talking to someone:0 Sitting quietly after lunch (no alcohol):2 In a car, while stopped in traffic:0 Total:8

## 2018-09-08 ENCOUNTER — Ambulatory Visit (INDEPENDENT_AMBULATORY_CARE_PROVIDER_SITE_OTHER): Payer: Medicare Other | Admitting: Neurology

## 2018-09-08 ENCOUNTER — Other Ambulatory Visit: Payer: Self-pay

## 2018-09-08 ENCOUNTER — Encounter: Payer: Self-pay | Admitting: Neurology

## 2018-09-08 DIAGNOSIS — R251 Tremor, unspecified: Secondary | ICD-10-CM

## 2018-09-08 DIAGNOSIS — G4733 Obstructive sleep apnea (adult) (pediatric): Secondary | ICD-10-CM

## 2018-09-08 DIAGNOSIS — Z9989 Dependence on other enabling machines and devices: Secondary | ICD-10-CM

## 2018-09-08 NOTE — Telephone Encounter (Signed)
Called and scheduled the pt 6 mth follow up with Megan NP nov 17 at 8:30 am.

## 2018-09-08 NOTE — Progress Notes (Addendum)
SLEEP MEDICINE CLINIC   Provider:  Melvyn Novas, MD   Referring Provider: Rodrigo Ran, MD Primary Care Physician:  Rodrigo Ran, MD   Virtual Visit via Telephone Note  I connected with Nicholas Robbins on 09/08/18 at  2:30 PM EDT by telephone and verified that I am speaking with the correct person using two identifiers.  Location: Patient: at the [facility]  Provider: at [facility]    I discussed the limitations, risks, security and privacy concerns of performing an evaluation and management service by telephone and the availability of in person appointments. I also discussed with the patient that there may be a patient responsible charge related to this service. The patient expressed understanding and agreed to proceed.       History of Present Illness:  Nicholas Robbins is a 85 y.o. 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  I have noticed that Nicholas Robbins resting tremor of the left hand has become more pronounced with high amplitude, the left shoulder is droopier and the overall muscle tone of the left upper extremity shows more rigidity, there is slightly less facial expression and there is a hoarseness of the voice which has been present for a long time.  But he also blinks very rarely.  He reports no dysphagia.  He remains ambulatory with a cane or walker and was in the building will use a walker. He walks with a rather stooped. Loss of arm swing left over right.  He reports his 67 year old sister has been hospitalized with Pneumonia and "wants to die"- he may be flying to see her in [place], [place]. .    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 He needs to establish himself with a new DME and sleep clinic. Dr Waynard Edwards referred him.  He received his diagnosis of  obstructive sleep apnea approximately 5 years ago and has used CPAP nightly. He carries the diagnosis of hypertension and has been treated with medications since 2000 he has benign prostate hyperplasia, and a coronary arthrosclerosis was diagnosed by chest CT in generally 2015. Dilated pulmonary arteries in generally 15 suspect that this is remote pulmonary embolism. And he had whooping cough as a child. His usual bedtime is 10:30 PM, he will promptly go to sleep within 5-10 minutes. He lives alone and there is no report of snoring but he does not feel that there is any effect of this being present. He wakes up usually restored or refreshed and has the most energy in the morning. His overall nocturnal sleep time is more than 6 hours and he rises at 5:30 AM regularly. He sets breakfasts for the guess that the pain center the morning has this as a routine as well, he drinks 2 cups of coffee in the morning, very rarely any daytime caffeine especially not in the form of soda. Every other day he will get a daytime nap of 30 minutes duration. A power nap. And he feels that this is refreshing. He is using a nasal CPAP mask interface. This has been described as comfortable or at least not bothersome to him. He has 2 nocturias at night. No trauma to neck or face, no tonsllectomy or nasal surgery. No TBI.  We were able today to get a compliance report  from Nicholas Robbins CPAP machine and it shows 100% compliance. He has used the machine on average 7 hours and 57 minutes at night he uses a ramp start at 4 cm water and a CPAP pressure of 5.5 cm water. His overall AHI also known as the apnea hypotony index was not elicited by this reading. There has also been no alarm set for air leakage. He has a C-Flex setting of 2 cm water.   10-09-14 : Continues to work [facility] [employer] [address]  He uses CPAP faithfully- 100% over 4 hours, residual AHI 2 at 5.5 cm water, very low.   04-24-15: I reviewed today the CPAP  download including the days of 12-20 6-16. The patient again has 100% compliance and a correlative usage of over 8 days and 22 hours. Average daily usage is 7 hours and 9 minutes. The patient is highly compliant but his residual AHI is not visible-  He is on a C-Flex setting of 2 cm water. He sleeps on average about 7 hours at night, going to bed at 10:30 and waking up at 5:30. He falls asleep promptly. I have the pleasure of seeing Nicholas Robbins today, for a routine compliance visit in our CPAP clinic. The patient continues to use CPAP at 100% compliance with 7 hours and 8 minutes on average nightly use, his CPAP setting is at only 5.5 cm. His residual AHI is again not visible. The patient is using a REMstar plus machine. He was already  Diagnosed 7 or 8 years ago and this is his original machine. It would not satisfy today standard of therapeutic data collection. The patient reports some daytime sleepiness at 12 out of 24 points and fatigue at 31 out of a possible 53 points. He does not endorse depression. My goal would be to retest Nicholas Robbins see if he still has apnea since he is doing well on a very low pressure. He has lost some weight. If his AHI is higher than 8 I would continue treatment with CPAP.   07-13-2017. Nicholas Robbins, interval history from 13 July 2017 Nicholas Robbins indicates that he is very happy with his new CPAP machine, no longer misses his old one, he underwent a 30-day continued auto titration between a minimum pressure setting of 5.6 and a maximum of 16 cm water.  He is 100% compliant CPAP user with the average use of time being 7 hours and 8 minutes each night, the EPR level is set at 1 cm, his 95th percentile pressure is 9.2 and his residual AHI is 3.4, this is very good.  He has some central apneas emerging however he seems to be able to use his CPAP throughout the night and actually sleep well.  His Epworth sleepiness score is endorsed at 15 points today, fatigue severity at 25 points. The   geriatric depression score is indicated at 2 out of 15 points . Sleep habits are unchanged.   10-13-2017, Nicholas Robbins presents for a follow up- he has more rigor, more masked facial expression, more dysphonia. He looks very parkinsonian now and has trouble with handwriting, due to tremor in his left dominant hand.  Left shoulder is droopy, he has lost muscle bulk bilaterally in the hypothenar eminence. He plans for a carpal tunnel surgery. He has not tried Primidone as it may leave him drowsy. He has not taken sinemet,  - he felt no response after 2 days (!) . I urged him to try again.  He  walks with a cane, and since he carries a cane in his right hand it may have contributed a little bit to pressure on the wrist.  He was also reporting that he for long time had degenerative changes in his neck spine and may need a imaging study to rule out a spinal stenosis or radiculopathy at that level.   He has been highly compliant as usually with a CPAP, 100% compliance 7 hours and 6 minutes his average nightly use of time.  Minimum pressure 5.6 maximum pressure of 16 cmH2O 1 cm EPR and a residual AHI of 1.3.  Excellent resolution.      Review of Systems:  Epworth sleepiness score is endorsed at 8/ 24  points today, 09-08-2018   He can fall asleep very easily. His fatigue severity at 25 points. The  geriatric depression score is indicated at 2 out of 15 points . Out of a complete 14 system review, the patient complains of only the following symptoms, and all other reviewed systems are negative. Single, masterRobbins degree , religous studies. 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  Schedules naps, has driving sleepiness problems- especially driving after lunch.  Takes 15 minute power naps, many.  Vivid dreams. No witness to his sleep.  Has been given Modafinil PRESCRIPTION and not yet used it.       09-08-2018, Objective : The patient was 100% compliant CPAP user for the last 30 days and also he states that he  does not feel it helps his sleep a lot his Epworth Sleepiness Scale has decreased since he uses CPAP.  His average user time is 7 hours and 25 minutes, minimum pressure is 5.6 cm water and a maximum pressure of 16 cmH2O with 1 cm expiratory pressure relief.  The residual AHI is 2.3 there are no obstructive apneas remaining but some central apneas.  The 95th percentile pressure used is 8.9 cmH2O and the patient has moderate to severe air leakage but does not wake up from these.  Family History  Problem Relation Age of Onset   Ulcers Nicholas 59       bleeding/ deceased   Lymphoma Brother    Breast cancer Sister    Colon cancer Neg Hx    Stomach cancer Neg Hx    Rectal cancer Neg Hx    Esophageal cancer Neg Hx    Liver cancer Neg Hx     Past Medical History:  Diagnosis Date   Arthritis    Benign essential tremor    BPH (benign prostatic hyperplasia)    Colon polyps    Diverticulitis    Essential hypertension 06/22/2018   Glaucoma    Hypercholesterolemia 06/22/2018   Hypertension    NSVT (nonsustained ventricular tachycardia) (HCC) 06/22/2018   Event Monitor 30 days 05/10/2018: Performed for syncope Predominant rhythm is sinus rhythm with first-degree AV block. No symptoms reported. One 4 beat VT at 8:46PM, Occasional PVCs.   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     aspirin 81 MG tablet Take  81 mg by mouth daily.     BENICAR 20 MG tablet Take 20 mg by mouth daily.  3   carbidopa-levodopa (SINEMET IR) 25-100 MG tablet TAKE 1 TABLET BY MOUTH THREE TIMES A DAY 90 tablet 10   COD LIVER OIL PO Take by mouth daily at 2 PM. One teaspoon     fexofenadine (ALLEGRA) 180 MG tablet Take 180 mg by mouth daily.     finasteride (PROSCAR) 5 MG tablet TAKE 1 TABLET DAILY AS DIRECTED.  3   fluocinonide (LIDEX) 0.05 % external solution Apply 1 application topically 2 (two) times daily. Instil 2 gtts in ear twice weekly     folic acid (FOLVITE) 800 MCG tablet Take 400 mcg by mouth daily.     Garlic 1000 MG CAPS Take 1,000 mg by mouth 2 (two) times a day.      glucosamine-chondroitin 500-400 MG tablet Take 1 tablet by mouth 2 (two) times daily.     hyoscyamine (LEVSIN SL) 0.125 MG SL tablet Place 0.125 mg under the tongue every 8 (eight) hours as needed for cramping.     Multiple Vitamin (MULTIVITAMIN) tablet Take 1 tablet by mouth daily. Shaklee vitamin w/o iron     Multiple Vitamins-Minerals (OCUVITE PRESERVISION PO) Take 1 tablet by mouth 2 (two) times daily.     NON FORMULARY Juice Plus Orchard Blend  2 daily and Garden Blend 2 daily     pravastatin (PRAVACHOL) 40 MG tablet Take 40 mg by mouth at bedtime.  2   Probiotic Product (ALIGN PO) Take by mouth.     Travoprost, BAK Free, (TRAVATAN) 0.004 % SOLN ophthalmic solution Place 1 drop into both eyes at bedtime.     triamcinolone (NASACORT ALLERGY 24HR) 55 MCG/ACT AERO nasal inhaler Place 2 sprays into the nose daily.     vitamin B-12 (CYANOCOBALAMIN) 1000 MCG tablet Take 1,000 mcg by mouth daily.     No current facility-administered medications for this visit.     Allergies as of 09/08/2018   (No Known Allergies)    Observations/Objective:   Vitals: There were no vitals taken for this visit. Last Weight:  Wt Readings from Last 1 Encounters:  06/22/18 197 lb (89.4 kg)       Last Height:   Ht Readings from Last 1  Encounters:  06/22/18 5' 8.5" (1.74 m)     Assessment and Plan:  OSA diagnosis established  2010 in [place] - degree of apnea not known, he was told he was " severe " .   Repeated sleep study from 11-18-2016 confirmed apnea and he was put on auto CPAP>  Doing well on 5.6 cm through 16 cm water with 1 cm EPR and few central apneas.  (1.2), total AHI 3.4/hr.  2) I was concerned about early PD  - mainly because of the unilateral onset of tremor, pill rolling character, increased rigor.  Hew did not have any improvement of sinemet Carbi -Dopa/  Levo- Dopa at 25/ 100 mg tid neither on quid.  The patient feels this issue is a related to a shoulder injury and this could explain the unilateral onset.  We agreed to d/c the dopamine medication.   3) pateint endorsed only 8/ 24 points on Epworth , no longer hypersomnia.    Follow Up Instructions: Rv  in 6 month. Face to face if possible.     I discussed the assessment and treatment plan with the patient. The patient was provided an opportunity to ask questions and all were answered. The patient agreed with the plan and demonstrated an understanding of the instructions.   The patient was advised to call back or seek an in-person evaluation if the symptoms worsen or if the condition fails to improve as anticipated.  I provided 19 minutes of non-face-to-face time during this encounter.   Melvyn Novas, MD   09/08/2018

## 2018-09-23 ENCOUNTER — Ambulatory Visit: Payer: Medicare Other | Admitting: Neurology

## 2018-09-24 ENCOUNTER — Other Ambulatory Visit: Payer: Self-pay

## 2018-09-24 ENCOUNTER — Encounter: Payer: Self-pay | Admitting: Cardiology

## 2018-09-24 ENCOUNTER — Ambulatory Visit (INDEPENDENT_AMBULATORY_CARE_PROVIDER_SITE_OTHER): Payer: Medicare Other | Admitting: Cardiology

## 2018-09-24 VITALS — BP 130/76 | HR 80 | Ht 68.0 in | Wt 195.6 lb

## 2018-09-24 DIAGNOSIS — I453 Trifascicular block: Secondary | ICD-10-CM | POA: Diagnosis not present

## 2018-09-24 DIAGNOSIS — I10 Essential (primary) hypertension: Secondary | ICD-10-CM

## 2018-09-24 NOTE — Progress Notes (Signed)
Subjective:  Primary Physician:  Crist Infante, MD  Patient ID: Nicholas Robbins, male    DOB: 08-08-1928, 85 y.o.   MRN: [id number]  Chief Complaint  Patient presents with  . Loss of Consciousness  . Hypertension  . Follow-up    HPI: Nicholas Robbins  is a 85 y.o. male who is very active and continues to live independently, coronary calcification noted by CT scan in 2016, without angina pectoris, trifascicular block on the EKG, episode of syncope on 05/07/2018 when he was sitting and talking to his friend suddenly passed out. No recurrence since then,carvedilol was discontinued.   He was seen by me on 05/10/18 and I had referred him for a 30 day event monitor in view of his underlying trifascicular block,  I have advised him not to drive for now until at least a event monitor reports are available. I had felt the symptoms may have been related to either orthostasis or could've been related to high degree AV block.  He has history of hypertension and OSA and compliant on CPAP.  Due to NSVT noted on event monitor, I did perform Lexiscan Myoview stress test which was nonischemic but LVEF was mildly reduced.  He now presents for a 72-monthfollow-up.  Continues to remain active, denies fatigue, dizziness or syncope. Denies chest pain or palpitations.  Past Medical History:  Diagnosis Date  . Arthritis   . Benign essential tremor   . BPH (benign prostatic hyperplasia)   . Colon polyps   . Diverticulitis   . Essential hypertension 06/22/2018  . Glaucoma   . Hypercholesterolemia 06/22/2018  . Hypertension   . [place]   Event Monitor 30 days 05/10/2018: Performed for syncope Predominant rhythm is sinus rhythm with first-degree AV block. No symptoms reported. One 4 beat VT at 8:46PM, Occasional PVCs.  . OSA on CPAP    use CPAP nightly  . Syncope and collapse 05/07/2018  . 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 BACK SURGERY     lumbar fusion  . broken ankle  1961  . CARPAL TUNNEL RELEASE Right 10/15/2017   Procedure: RIGHT CARPAL TUNNEL RELEASE;  Surgeon: KDaryll Brod MD;  [place] [facility]  Service: Orthopedics;  Laterality: Right;  . HERNIA REPAIR    . REPLACEMENT TOTAL KNEE  2005  . VITRECTOMY Left 2006    Social History   Socioeconomic History  . Marital status: Single    Spouse name: Not on file  . Number of children: 0  . Years of education: Not on file  . Highest education level: Not on file  Occupational History  . 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 Financial resource strain: Not on file  . Food insecurity:    Worry: Not on file    Inability: Not on file  . Transportation needs:    Medical: Not on file    Non-medical: Not on file  Tobacco Use  . Smoking status: Never Smoker  . Smokeless tobacco: Never Used  Substance and Sexual Activity  . Alcohol use: Yes    Alcohol/week: 0.0 standard drinks    Comment: occasional  . Drug use: No  . Sexual activity: Not on file  Lifestyle  . Physical activity:    Days per week: Not on file    Minutes per session: Not on file  . Stress: Not on file  Relationships  .  Social connections:    Talks on phone: Not on file    Gets together: Not on file    Attends religious service: Not on file    Active member of club or organization: Not on file    Attends meetings of clubs or organizations: Not on file    Relationship status: Not on file  . Intimate partner violence:    Fear of current or ex partner: Not on file    Emotionally abused: Not on file    Physically abused: Not on file    Forced sexual activity: Not on file  Other Topics Concern  . Not on file  Social History Narrative   Daily Caffeine.  Lives at home,  With [facility].  Works at [employer].  [employer], post graduate.   Single, no kids.     Labs: 05/07/2018: TSH 1.49.  Creatinine 1.2, potassium  4.1, EGFR 57, BMP normal.  WBC 10.9, RBC 3.9, normal H&H, MCV 99.8, CBC otherwise normal.  Labs 09/01/2017: Serum glucose 112 mg, BUN 16, creatinine 0.9, EGFR greater than 60 ML, potassium 4.6.  HB 12.3/HCT 37.7, platelets 221, normal indicis.  Total cholesterol 153, triglycerides 115, HDL 51, LDL 79.  Non-HDL cholesterol 102.  TSH normal.   Current Outpatient Medications on File Prior to Visit  Medication Sig Dispense Refill  . aspirin 81 MG tablet Take 81 mg by mouth daily.    Marland Kitchen BENICAR 20 MG tablet Take 20 mg by mouth daily.  3  . COD LIVER OIL PO Take by mouth daily at 2 PM. One teaspoon    . fexofenadine (ALLEGRA) 180 MG tablet Take 180 mg by mouth daily.    . finasteride (PROSCAR) 5 MG tablet TAKE 1 TABLET DAILY AS DIRECTED.  3  . fluocinonide (LIDEX) 0.05 % external solution Apply 1 application topically 2 (two) times daily. Instil 2 gtts in ear twice weekly    . folic acid (FOLVITE) 161 MCG tablet Take 400 mcg by mouth daily.    . Garlic 0960 MG CAPS Take 1,000 mg by mouth 2 (two) times a day.     Marland Kitchen glucosamine-chondroitin 500-400 MG tablet Take 1 tablet by mouth 2 (two) times daily.    . hyoscyamine (LEVSIN SL) 0.125 MG SL tablet Place 0.125 mg under the tongue every 8 (eight) hours as needed for cramping.    . Multiple Vitamin (MULTIVITAMIN) tablet Take 1 tablet by mouth daily. Shaklee vitamin w/o iron    . Multiple Vitamins-Minerals (OCUVITE PRESERVISION PO) Take 1 tablet by mouth 2 (two) times daily.    . NON FORMULARY Juice Plus Orchard Blend  2 daily and Garden Blend 2 daily    . pravastatin (PRAVACHOL) 40 MG tablet Take 40 mg by mouth at bedtime.  2  . Probiotic Product (ALIGN PO) Take by mouth.    . Travoprost, BAK Free, (TRAVATAN) 0.004 % SOLN ophthalmic solution Place 1 drop into both eyes at bedtime.    . triamcinolone (NASACORT ALLERGY 24HR) 55 MCG/ACT AERO nasal inhaler Place 2 sprays into the nose daily.    . vitamin B-12 (CYANOCOBALAMIN) 1000 MCG tablet Take 1,000 mcg  by mouth daily.     No current facility-administered medications on file prior to visit.    Review of Systems  Constitutional: Negative for malaise/fatigue and weight loss.  Respiratory: Negative for cough, hemoptysis and shortness of breath.   Cardiovascular: Positive for leg swelling (right ankle since trauma, bilateral edema and wears support stockings).  Negative for chest pain, palpitations and claudication.  Gastrointestinal: Negative for abdominal pain, blood in stool, constipation, heartburn and vomiting.  Genitourinary: Negative for dysuria.  Musculoskeletal: Positive for back pain and joint pain (right ankle from remote injury). Negative for myalgias.  Neurological: Positive for tremors. Negative for dizziness, focal weakness and headaches.  Endo/Heme/Allergies: Does not bruise/bleed easily.  Psychiatric/Behavioral: Negative for depression. The patient is not nervous/anxious.   All other systems reviewed and are negative.     Objective:  Blood pressure 130/76, pulse 80, height '5\' 8"'  (1.727 m), weight 195 lb 9.6 oz (88.7 kg), SpO2 98 %. Body mass index is 29.74 kg/m.  Physical Exam  Constitutional: He appears well-developed and well-nourished. No distress.  HENT:  Head: Atraumatic.  Eyes: Conjunctivae are normal.  Neck: Neck supple. No JVD present. No thyromegaly present.  Cardiovascular: Normal rate, regular rhythm, normal heart sounds and intact distal pulses. Exam reveals no gallop.  No murmur heard. Bilateral varicose veins right more prominent noted.  2+ right ankle edema and leg edema, pitting and trace left leg edema.  Pulmonary/Chest: Effort normal and breath sounds normal.  Abdominal: Soft. Bowel sounds are normal.  Musculoskeletal: Normal range of motion.  Neurological: He is alert.  Skin: Skin is warm and dry.  Psychiatric: He has a normal mood and affect.   CARDIAC STUDIES:   Event Monitor 30 days 05-12-2018:  Predominant rhythm is sinus rhythm with  first-degree AV block. No symptoms reported. One 4 beat VT at 8:46PM, Occasional PVCs.  Sleep study 03/31/2014 Positive for Sleep Apnea, follows Dr. Paulla Dolly myoview stress test 06/21/2018:  1. Lexiscan stress test was performed. Exercise capacity was not assessed. No stress symptoms reported. Resting blood pressure was 136/72 mmHg and peak effect blood pressure was 140/70 mmHg. The resting and stress electrocardiogram demonstrated normal sinus rhythm, RBBB + LAHB, occasional PVC and normal rest repolarization.  Stress EKG is non diagnostic for ischemia as it is a pharmacologic stress.  2. The overall quality of the study is good. There is no evidence of abnormal lung activity. Stress and rest SPECT images demonstrate homogeneous tracer distribution throughout the myocardium. Gated SPECT imaging reveals mild global decrease in myocardial thickening and wall motion. The left ventricular ejection fraction was normal (46%).   3. High risk study due to reduced LVEF. No evidence of ischemia/ infarction.  Lexiscan myoview 06/16/2014: SPECT images demonstrate a moderate-sized mild ischemia extending from the base towards the apex in the septal wall. Left ventricle systolic function was Calculated at 50%. There was mild septal hypokinesis. This represents an intermediate risk scan.  Assessment & Recommendations:   Trifascicular block  Essential hypertension  EKG 2018-05-12: Sinus rhythm with first-degree block @ 67 bpm, left axis deviation, left anterior fascicular block. Right bundle branch block. Trifascicular block. Nonspecific T abnormality. No significant change from 03/21/18.   Recommendation:    Patient remains asymptomatic although has underlying trifascicular block, he syncope noted in January 2020 was felt to be probably orthostatic hypotension.  I have again discussed with him the signs and symptoms of heart block and to seek immediate attention if he were to notice fatigue or  dizziness.  He can resume driving.  Blood pressure is well controlled.  He does have coronary calcification and on statins as well.  I will see him back in 6 months for follow-up.  No changes in the medications were done today.  Adrian Prows, MD, [facility] 09/24/2018, 10:55 AM [facility] Pager: [phone] Office:  [phone]  If no answer Cell [phone]

## 2018-10-12 ENCOUNTER — Encounter: Payer: Self-pay | Admitting: Neurology

## 2018-10-19 NOTE — Progress Notes (Signed)
Nicholas Robbins was seen today in the movement disorders clinic for neurologic consultation at the request of Rodrigo RanPerini, Mark, MD.  The consultation is for the evaluation of second opinion regarding Parkinson's disease.  Patient currently sees Dr. Vickey Hugerohmeier.  Her medical records have been reviewed.  Patient has been following with Dr. Vickey Hugerohmeier since 2016 for the diagnosis and treatment of sleep apnea.  On February 09, 2017, Dr. Vickey Hugerohmeier noted a left hand tremor which she stated could be related to a "severe shoulder pain following a presumed rotator cuff injury."  They decided to take a wait and see approach at that time.  On July 13, 2017, when the patient was following up for sleep apnea, Dr. Vickey Hugerohmeier again noted resting tremor on the left and a "droopy left shoulder."  She gave him a prescription for carbidopa/levodopa 25/100 "up to 3 times daily, trial with intake 1 hour before mass."  It doesn't appear that he took that then, but he eventually did and worked up to carbidopa/levodopa 25/100, 1 tablet 4 times per day (pt states that he wasn't taking it consistently and would sometimes take it bid and sometimes tid and other times qid).  He apparently did not have improvement in tremor, and it was discontinued.   Specific Symptoms:  Tremor: Yes.  , L hand (he is L hand dominant) Family hx of similar:  No. Voice: gotten weaker Sleep: sleeps well at night with cpap  Vivid Dreams:  No.  Acting out dreams:  No. Wet Pillows: No. Postural symptoms:  Yes.  , thinks due to scoliosis and "low back problems"  Falls?  No falls in 3 years Bradykinesia symptoms: difficulty getting out of a chair Loss of smell:  No. Loss of taste:  No. Urinary Incontinence:  No. (urgency and frequency) Difficulty Swallowing:  No. Handwriting, micrographia: Yes.   Trouble with ADL's:  No.  Trouble buttoning clothing: Yes.  , but can be trouble Depression:  No. Memory changes:  No. Hallucinations:  No.  visual distortions:  Yes.  , rarely N/V:  No. Lightheaded:  No.  Syncope: Yes.  , 04/2018 - sees Dr. Jacinto HalimGanji.  Reviewed those records.  Felt related to probable orthostatic hypotension (they d/c carvedilolol after that).  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 Diplopia:  No. Dyskinesia:  No. Prior exposure to reglan/antipsychotics: No.  Neuroimaging of the brain has not previously been performed.   PREVIOUS MEDICATIONS: Sinemet  ALLERGIES:  No Known Allergies  CURRENT MEDICATIONS:  Outpatient Encounter Medications as of 10/21/2018  Medication Sig  . aspirin 81 MG tablet Take 81 mg by mouth daily.  Marland Kitchen. BENICAR 20 MG tablet Take 20 mg by mouth daily.  . COD LIVER OIL PO Take by mouth daily at 2 PM. One teaspoon  . fexofenadine (ALLEGRA) 180 MG tablet Take 180 mg by mouth daily.  . finasteride (PROSCAR) 5 MG tablet TAKE 1 TABLET DAILY AS DIRECTED.  . fluocinonide (LIDEX) 0.05 % external solution Apply 1 application topically 2 (two) times daily. Instil 2 gtts in ear twice weekly  . folic acid (FOLVITE) 800 MCG tablet Take 400 mcg by mouth daily.  . Garlic 1000 MG CAPS Take 1,000 mg by mouth 2 (two) times a day.   Marland Kitchen. glucosamine-chondroitin 500-400 MG tablet Take 1 tablet by mouth 2 (two) times daily.  . hyoscyamine (LEVSIN SL) 0.125 MG SL tablet Place 0.125 mg under the tongue every 8 (eight) hours as needed for cramping.  .Marland Kitchen  Multiple Vitamin (MULTIVITAMIN) tablet Take 1 tablet by mouth daily. Shaklee vitamin w/o iron  . Multiple Vitamins-Minerals (OCUVITE PRESERVISION PO) Take 1 tablet by mouth 2 (two) times daily.  . NON FORMULARY Juice Plus Orchard Blend  2 daily and Garden Blend 2 daily  . pravastatin (PRAVACHOL) 40 MG tablet Take 40 mg by mouth at bedtime.  . Probiotic Product (ALIGN PO) Take by mouth.  . Travoprost, BAK Free, (TRAVATAN) 0.004 % SOLN ophthalmic solution Place 1 drop into both eyes at bedtime.  . triamcinolone (NASACORT ALLERGY 24HR) 55 MCG/ACT AERO nasal inhaler  Place 2 sprays into the nose daily.  . vitamin B-12 (CYANOCOBALAMIN) 1000 MCG tablet Take 1,000 mcg by mouth daily.   No facility-administered encounter medications on file as of 10/21/2018.     PAST MEDICAL HISTORY:   Past Medical History:  Diagnosis Date  . Arthritis   . Asthma   . Benign essential tremor   . BPH (benign prostatic hyperplasia)   . Central serous retinopathy   . COAG (chronic open-angle glaucoma)   . Colon polyps   . Diverticulitis   . Essential hypertension 06/22/2018  . Glaucoma   . Hypercholesterolemia 06/22/2018  . Hyperlipidemia   . Hypertension   . Macular degeneration   . Macular hole   . NSVT (nonsustained ventricular tachycardia) (HCC) 06/22/2018   Event Monitor 30 days 05/10/2018: Performed for syncope Predominant rhythm is sinus rhythm with first-degree AV block. No symptoms reported. One 4 beat VT at 8:46PM, Occasional PVCs.  . OSA on CPAP    use CPAP nightly  . Pseudophakia of both eyes   . Syncope and collapse 05/07/2018  . 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 BACK SURGERY     lumbar fusion  . broken ankle  1961  . CARPAL TUNNEL RELEASE Right 10/15/2017   Procedure: RIGHT CARPAL TUNNEL RELEASE;  Surgeon: Cindee SaltKuzma, Gary, MD;  Location: [facility];  Service: Orthopedics;  Laterality: Right;  . CATARACT EXTRACTION, BILATERAL    . HERNIA REPAIR    . REPLACEMENT TOTAL KNEE  2005  . VITRECTOMY Left 2006    SOCIAL HISTORY:   Social History   Socioeconomic History  . Marital status: Single    Spouse name: Not on file  . Number of children: 0  . Years of education: Not on file  . Highest education level: Not on file  Occupational History  . 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 Financial resource strain: Not on file  . Food insecurity    Worry: Not on file    Inability: Not on file  . Transportation needs    Medical: Not on file    Non-medical:  Not on file  Tobacco Use  . Smoking status: Never Smoker  . Smokeless tobacco: Never Used  Substance and Sexual Activity  . Alcohol use: Yes    Alcohol/week: 0.0 standard drinks    Comment: occasional  . Drug use: No  . Sexual activity: Not on file  Lifestyle  . Physical activity    Days per week: Not on file    Minutes per session: Not on file  . Stress: Not on file  Relationships  . Social Musicianconnections    Talks on phone: Not on file    Gets together: Not on file    Attends religious service: Not on file    Active member of club or organization: Not  on file    Attends meetings of clubs or organizations: Not on file    Relationship status: Not on file  . Intimate partner violence    Fear of current or ex partner: Not on file    Emotionally abused: Not on file    Physically abused: Not on file    Forced sexual activity: Not on file  Other Topics Concern  . Not on file  Social History Narrative   Daily Caffeine.  Lives at home,  With [facility].  Works at [employer].  [employer], post graduate.   Single, no kids.      FAMILY HISTORY:   Family Status  Relation Name Status  . Mother  Deceased at age 95K       complication surgery  . Father  Deceased at age 49y       bleeding ulcers  . Brother 2 Alive       lymphoma  . Brother  Deceased  . Sister  Alive  . Neg Hx  (Not Specified)    ROS:  Review of Systems  Constitutional: Negative.   HENT: Negative.   Eyes: Negative.   Respiratory: Negative.   Cardiovascular: Negative.   Gastrointestinal: Positive for constipation (intermittent).  Genitourinary: Positive for frequency and urgency.  Musculoskeletal: Positive for back pain.  Skin: Negative.   Endo/Heme/Allergies: Negative.   Psychiatric/Behavioral: Negative.     PHYSICAL EXAMINATION:    VITALS:   Vitals:   10/21/18 0926  BP: 118/76  Pulse: 71  Temp: 97.8 F (36.6 C)  SpO2: 98%  Weight: 199 lb (90.3 kg)  Height: 5\' 7"  (1.702 m)     GEN:  The patient appears stated age and is in NAD. HEENT:  Normocephalic, atraumatic.  The mucous membranes are moist. The superficial temporal arteries are without ropiness or tenderness. CV:  RRR Lungs:  CTAB Neck/HEME:  There are no carotid bruits bilaterally.  Neurological examination:  Orientation: The patient is alert and oriented x3. Fund of knowledge is appropriate.  Recent and remote memory are intact.  Attention and concentration are normal.    Able to name objects and repeat phrases. Cranial nerves: There is good facial symmetry.  Extraocular muscles are intact. The visual fields are full to confrontational testing. The speech is fluent and clear. Soft palate rises symmetrically and there is no tongue deviation. Hearing is intact to conversational tone. Sensation: Sensation is intact to light and pinprick throughout (facial, trunk, extremities). Vibration is intact at the bilateral big toe. There is no extinction with double simultaneous stimulation. There is no sensory dermatomal level identified. Motor: Strength is 5/5 in the bilateral upper and lower extremities.   Shoulder shrug is equal and symmetric.  There is no pronator drift. Deep tendon reflexes: Deep tendon reflexes are 2/4 at the bilateral biceps, triceps, brachioradialis, left patella, absent at the right patella and bilateral achilles. Plantar responses are downgoing bilaterally.  Movement examination: Tone: There is mild to mod increased tone in the LUE.  Tone elsewhere is normal Abnormal movements: there is a near constant LUE resting tremor.  It increases with distraction Coordination:  There is  decremation with RAM's, with any form of RAMS, including alternating supination and pronation of the forearm, hand opening and closing, finger taps, heel taps and toe taps on the L. He has trouble with toe taps on the R due to prior ankle surgery but otherwise all RAMs on the right are good Gait and Station: The patient  has  mild difficulty arising out of a deep-seated chair without the use of the hands (able to on the 3rd attempt). 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  He walks with the cane (walker at home per patient).    Labs: None sent  ASSESSMENT/PLAN:  1.  Parkinson's disease  -Patient has bradykinesia, rigidity, and tremor.  He does meet [place] brain bank criteria for the diagnosis.  He and I discussed this today.  -We discussed the diagnosis as well as pathophysiology of the disease.  We discussed treatment options as well as prognostic indicators.  Patient education was provided.  -We discussed that it used to be thought that levodopa would increase risk of melanoma but now it is believed that Parkinsons itself likely increases risk of melanoma. he is to get regular skin checks.  -Greater than 50% of the 60 minute visit was spent in counseling answering questions and talking about what to expect now as well as in the future.  We talked about medication options as well as potential future surgical options.  We talked about safety in the home.  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  Discussed that this is true in 30 to 50% of patients who are on levodopa, but that this does not mean that levodopa does not work, but rather that it just does not help tremor.  He is rigid and this increases risk for falls.  We ultimately decided to restart the carbidopa/levodopa 25/100 and worked to 1 tablet 3 times per day.  He may need more.  We discussed risk, benefits, and side effects.  Understanding was expressed.  -We discussed community resources in the area including patient support groups and community exercise programs for PD and pt education was provided to the patient.  2 obstructive sleep apnea syndrome  -Understands morbidity and mortality associated with untreated sleep apnea.  Faithfully wears his CPAP.  3.  He will continue to follow with Dr. Vickey Hugerohmeier, as this  was just a second opinion.  He will follow-up here as needed.  Cc:  Rodrigo RanPerini, Mark, MD

## 2018-10-21 ENCOUNTER — Ambulatory Visit (INDEPENDENT_AMBULATORY_CARE_PROVIDER_SITE_OTHER): Payer: Medicare Other | Admitting: Neurology

## 2018-10-21 ENCOUNTER — Encounter: Payer: Self-pay | Admitting: Neurology

## 2018-10-21 ENCOUNTER — Other Ambulatory Visit: Payer: Self-pay

## 2018-10-21 VITALS — BP 118/76 | HR 71 | Temp 97.8°F | Ht 67.0 in | Wt 199.0 lb

## 2018-10-21 DIAGNOSIS — G4733 Obstructive sleep apnea (adult) (pediatric): Secondary | ICD-10-CM | POA: Diagnosis not present

## 2018-10-21 DIAGNOSIS — G20 Parkinson's disease: Secondary | ICD-10-CM | POA: Diagnosis not present

## 2018-10-21 MED ORDER — CARBIDOPA-LEVODOPA 25-100 MG PO TABS: 1.0000 | ORAL_TABLET | Freq: Three times a day (TID) | ORAL | 1 refills | Status: DC

## 2018-10-21 NOTE — Patient Instructions (Addendum)
Start Carbidopa Levodopa as follows:  Take 1/2 tablet three times daily, at 6am/10am/3pm  Then take 1/2 tablet at 6am, 1/2 tablet at 10am, 1 tablet at 3pm, at least 30 minutes before meals, for one week  Then take 1/2 tablet at 6am, 1 tablet at 10am, 1 tablet at 3pm, at least 30 minutes before meals, for one week  Then take 1 tablet three times daily, at 6am/10am/3pm   As a reminder, carbidopa/levodopa can be taken at the same time as a carbohydrate, but we like to have you take your pill either 30 minutes before a protein source or 1 hour after as protein can interfere with carbidopa/levodopa absorption.  You can follow up with Dr. Brett Fairy as previously scheduled.  It was good to see you today!  The physicians and staff at [facility] are committed to providing excellent care. You may receive a survey requesting feedback about your experience at our office. We strive to receive "very good" responses to the survey questions. If you feel that your experience would prevent you from giving the office a "very good " response, please contact our office to try to remedy the situation. We may be reached at [phone]. Thank you for taking the time out of your busy day to complete the survey.

## 2019-03-13 ENCOUNTER — Encounter: Payer: Self-pay | Admitting: Neurology

## 2019-03-15 ENCOUNTER — Ambulatory Visit: Payer: Medicare Other | Admitting: Adult Health

## 2019-03-15 ENCOUNTER — Other Ambulatory Visit: Payer: Self-pay

## 2019-03-15 ENCOUNTER — Encounter: Payer: Self-pay | Admitting: Adult Health

## 2019-03-15 VITALS — BP 126/74 | HR 73 | Temp 97.6°F | Ht 67.0 in | Wt 204.2 lb

## 2019-03-15 DIAGNOSIS — G20 Parkinson's disease: Secondary | ICD-10-CM | POA: Diagnosis not present

## 2019-03-15 DIAGNOSIS — G4733 Obstructive sleep apnea (adult) (pediatric): Secondary | ICD-10-CM

## 2019-03-15 DIAGNOSIS — Z9989 Dependence on other enabling machines and devices: Secondary | ICD-10-CM

## 2019-03-15 NOTE — Progress Notes (Signed)
PATIENT: Nicholas Robbins DOB: [birth year]  REASON FOR VISIT: follow up HISTORY FROM: patient  HISTORY OF PRESENT ILLNESS: Today 03/15/19:  Mr. Aultman is an 85 year old male with a history of obstructive sleep apnea on CPAP and Parkinson's disease.  He returns today for follow-up.  His download indicates that he uses his machine nightly for compliance of 100%.  He uses machine greater than 4 hours each night.  On average he uses his machine 7 hours and 3 minutes.  His residual AHI is 4.6 on 5.6 to 16 cm of water with EPR of 1.  His leak in the 95th percentile is 38 L/min.  He states that he does not change out his supplies regularly.  On occasion he does feel the mask leaking.  He did see Dr. Arbutus Leas for second opinion regarding his Parkinson's.  I have reviewed her note.  She recommended that he continue on Sinemet 3 times a day.  The patient has a tremor in the left hand.  He denies any changes with his gait or balance.  Denies any falls.  Denies any trouble chewing or swallowing food.  Denies any trouble sleeping.  He returns today for an evaluation.  HISTORY 09-08-2018, (Copied from Dr.Dohmeier's note)  The patient was 100% compliant CPAP user for the last 30 days and also he states that he does not feel it helps his sleep a lot his Epworth Sleepiness Scale has decreased since he uses CPAP.  His average user time is 7 hours and 25 minutes, minimum pressure is 5.6 cm water and a maximum pressure of 16 cmH2O with 1 cm expiratory pressure relief.  The residual AHI is 2.3 there are no obstructive apneas remaining but some central apneas.  The 95th percentile pressure used is 8.9 cmH2O and the patient has moderate to severe air leakage but does not wake up from these.  REVIEW OF SYSTEMS: Out of a complete 14 system review of symptoms, the patient complains only of the following symptoms, and all other reviewed systems are negative.  Epworth sleepiness score 12 fatigue severity score 24  ALLERGIES:  No Known Allergies  HOME MEDICATIONS: Outpatient Medications Prior to Visit  Medication Sig Dispense Refill  . aspirin 81 MG tablet Take 81 mg by mouth daily.    Marland Kitchen BENICAR 20 MG tablet Take 20 mg by mouth daily.  3  . carbidopa-levodopa (SINEMET IR) 25-100 MG tablet Take 1 tablet by mouth 3 (three) times daily. 270 tablet 1  . COD LIVER OIL PO Take by mouth daily at 2 PM. One teaspoon    . fexofenadine (ALLEGRA) 180 MG tablet Take 180 mg by mouth daily.    . finasteride (PROSCAR) 5 MG tablet TAKE 1 TABLET DAILY AS DIRECTED.  3  . fluocinonide (LIDEX) 0.05 % external solution Apply 1 application topically 2 (two) times daily. Instil 2 gtts in ear twice weekly    . folic acid (FOLVITE) 800 MCG tablet Take 400 mcg by mouth daily.    . Garlic 1000 MG CAPS Take 1,000 mg by mouth 2 (two) times a day.     Marland Kitchen glucosamine-chondroitin 500-400 MG tablet Take 1 tablet by mouth 2 (two) times daily.    . hyoscyamine (LEVSIN SL) 0.125 MG SL tablet Place 0.125 mg under the tongue as needed for cramping.     . Multiple Vitamin (MULTIVITAMIN) tablet Take 1 tablet by mouth daily. Shaklee vitamin w/o iron    . Multiple Vitamins-Minerals (OCUVITE PRESERVISION PO) Take 1  tablet by mouth 2 (two) times daily.    . NON FORMULARY Juice Plus Orchard Blend  2 daily and Garden Blend 2 daily    . pravastatin (PRAVACHOL) 40 MG tablet Take 40 mg by mouth at bedtime.  2  . Probiotic Product (ALIGN PO) Take by mouth.    . silodosin (RAPAFLO) 8 MG CAPS capsule Take 8 mg by mouth daily with breakfast.    . Travoprost, BAK Free, (TRAVATAN) 0.004 % SOLN ophthalmic solution Place 1 drop into both eyes at bedtime.    . triamcinolone (NASACORT ALLERGY 24HR) 55 MCG/ACT AERO nasal inhaler Place 2 sprays into the nose daily.    . vitamin B-12 (CYANOCOBALAMIN) 1000 MCG tablet Take 1,000 mcg by mouth daily.     No facility-administered medications prior to visit.     PAST MEDICAL HISTORY: Past Medical History:  Diagnosis Date  .  Arthritis   . Asthma   . Benign essential tremor   . BPH (benign prostatic hyperplasia)   . Central serous retinopathy   . COAG (chronic open-angle glaucoma)   . Colon polyps   . Diverticulitis   . Essential hypertension 06/22/2018  . Glaucoma   . Hypercholesterolemia 06/22/2018  . Hyperlipidemia   . Hypertension   . Macular degeneration   . Macular hole   . NSVT (nonsustained ventricular tachycardia) ([facility]) 06/22/2018   Event Monitor 30 days 05/10/2018: Performed for syncope Predominant rhythm is sinus rhythm with first-degree AV block. No symptoms reported. One 4 beat VT at 8:46PM, Occasional PVCs.  . OSA on CPAP    use CPAP nightly  . Pseudophakia of both eyes   . Syncope and collapse 05/07/2018  . 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 BACK SURGERY     lumbar fusion  . broken ankle  1961  . CARPAL TUNNEL RELEASE Right 10/15/2017   Procedure: RIGHT CARPAL TUNNEL RELEASE;  Surgeon: Daryll Brod, MD;  Location: [facility];  Service: Orthopedics;  Laterality: Right;  . CATARACT EXTRACTION, BILATERAL    . HERNIA REPAIR    . REPLACEMENT TOTAL KNEE  2005  . VITRECTOMY Left 2006    FAMILY HISTORY: Family History  Problem Relation Age of Onset  . Hypertension Mother   . Stroke Mother   . Ulcers Father 35       bleeding/ deceased  . Lymphoma Brother        cancer  . Breast cancer Sister   . Colon cancer Neg Hx   . Stomach cancer Neg Hx   . Rectal cancer Neg Hx   . Esophageal cancer Neg Hx   . Liver cancer Neg Hx     SOCIAL HISTORY: Social History   Socioeconomic History  . Marital status: Single    Spouse name: Not on file  . Number of children: 0  . Years of education: Not on file  . Highest education level: Not on file  Occupational History  . 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 Financial resource strain: Not on file  . Food insecurity    Worry: Not on file     Inability: Not on file  . Transportation needs    Medical: Not on file    Non-medical: Not on file  Tobacco Use  . Smoking status: Never Smoker  . Smokeless tobacco: Never Used  Substance and Sexual Activity  . Alcohol use: Yes    Alcohol/week: 0.0 standard drinks  Comment: occasional  . Drug use: No  . Sexual activity: Not on file  Lifestyle  . Physical activity    Days per week: Not on file    Minutes per session: Not on file  . Stress: Not on file  Relationships  . Social Musicianconnections    Talks on phone: Not on file    Gets together: Not on file    Attends religious service: Not on file    Active member of club or organization: Not on file    Attends meetings of clubs or organizations: Not on file    Relationship status: Not on file  . Intimate partner violence    Fear of current or ex partner: Not on file    Emotionally abused: Not on file    Physically abused: Not on file    Forced sexual activity: Not on file  Other Topics Concern  . Not on file  Social History Narrative   Daily Caffeine.  Lives at home,  With [facility].  Works at [employer].  [employer], post graduate.   Single, no kids.        PHYSICAL EXAM  Vitals:   03/15/19 0821  BP: 126/74  Pulse: 73  Temp: 97.6 F (36.4 C)  TempSrc: Oral  Weight: 204 lb 3.2 oz (92.6 kg)  Height: 5\' 7"  (1.702 m)   Body mass index is 31.98 kg/m.  Generalized: Well developed, in no acute distress   Neurological examination  Mentation: Alert oriented to time, place, history taking. Follows all commands speech and language fluent Cranial nerve II-XII: Pupils were equal round reactive to light. Extraocular movements were full, visual field were full on confrontational test. Facial sensation and strength were normal. Uvula tongue midline. Head turning and shoulder shrug  were normal and symmetric. Motor: The motor testing reveals 5 over 5 strength of all 4 extremities.  Finger taps moderately  impaired on the left.  Mild to moderate increased tone on the left upper extremity.  Resting tremor noted in the left upper extremity Sensory: Sensory testing is intact to soft touch on all 4 extremities. No evidence of extinction is noted.  Coordination: Cerebellar testing reveals good finger-nose-finger and heel-to-shin bilaterally.  Gait and station: Patient uses a cane when ambulating.  Tremor noted on the left..  Reflexes: Deep tendon reflexes are symmetric and normal bilaterally.   DIAGNOSTIC DATA (LABS, IMAGING, TESTING) - I reviewed patient records, labs, notes, testing and imaging myself where available.     ASSESSMENT AND PLAN 85 y.o. 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 here with:  1.  Obstructive sleep apnea on CPAP  -Good compliance and treatment of his apnea -Leak with a mask present-encouraged to change out supplies regularly and try to tighten the straps -May need a mask refitting in the future -Encouraged to continue using the CPAP nightly and greater than 4 hours each night  2.  Parkinson's disease  -Continue Sinemet 25-100 mg 3 times a day  Overall the patient has remained relatively stable.  He is advised if his symptoms worsen or he develops new symptoms he should let us know.  He will follow-up in  6 months or sooner if needed.    Butch Penny, MSN, NP-C 03/15/2019, 8:39 AM [facility] [address] [place], [place] [address] [phone]

## 2019-03-15 NOTE — Patient Instructions (Signed)
Your Plan:  Continue Sinemet three times a day Continue CPAP nightly and >4 hours each night  Thank you for coming to see [place] at [facility]. I hope we have been able to provide you high quality care today.  You may receive a patient satisfaction survey over the next few weeks. We would appreciate your feedback and comments so that we may continue to improve ourselves and the health of our patients.

## 2019-03-17 ENCOUNTER — Encounter: Payer: Self-pay | Admitting: Cardiology

## 2019-03-17 ENCOUNTER — Other Ambulatory Visit: Payer: Self-pay

## 2019-03-17 ENCOUNTER — Ambulatory Visit: Payer: Medicare Other | Admitting: Cardiology

## 2019-03-17 VITALS — BP 133/77 | HR 74 | Ht 67.0 in | Wt 202.0 lb

## 2019-03-17 DIAGNOSIS — I453 Trifascicular block: Secondary | ICD-10-CM | POA: Diagnosis not present

## 2019-03-17 DIAGNOSIS — I10 Essential (primary) hypertension: Secondary | ICD-10-CM

## 2019-03-17 NOTE — Progress Notes (Signed)
Subjective:  Primary Physician:  Crist Infante, MD  Patient ID: Nicholas Robbins, male    DOB: 09/09/1928, 85 y.o.   MRN: [id number]  Chief Complaint  Patient presents with  . Bradycardia    Trifascicular block  . Hypertension    HPI: Nicholas Robbins  is a 85 y.o. 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 Event monitor in January 2020 did not reveal any high degree AV block but had 4 beat NSVT, nuclear stress test on 08/20/2018 was nonischemic.  He is here for follow-up of trifascicular block.   Continues to remain active, denies fatigue, dizziness or syncope. Denies chest pain or palpitations.  Past Medical History:  Diagnosis Date  . Arthritis   . Asthma   . Benign essential tremor   . BPH (benign prostatic hyperplasia)   . Central serous retinopathy   . COAG (chronic open-angle glaucoma)   . Colon polyps   . Diverticulitis   . Essential hypertension 06/22/2018  . Glaucoma   . Hypercholesterolemia 06/22/2018  . Hyperlipidemia   . Hypertension   . Macular degeneration   . Macular hole   . NSVT (nonsustained ventricular tachycardia) ([place]) 06/22/2018   Event Monitor 30 days 05/10/2018: Performed for syncope Predominant rhythm is sinus rhythm with first-degree AV block. No symptoms reported. One 4 beat VT at 8:46PM, Occasional PVCs.  . OSA on CPAP    use CPAP nightly  . Pseudophakia of both eyes   . Syncope and collapse 05/07/2018  . 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 BACK SURGERY     lumbar fusion  . broken ankle  1961  . CARPAL TUNNEL RELEASE Right 10/15/2017   Procedure: RIGHT CARPAL TUNNEL RELEASE;  Surgeon: Daryll Brod, MD;  Location: [place];  Service: Orthopedics;  Laterality: Right;  . CATARACT EXTRACTION, BILATERAL    .  HERNIA REPAIR    . REPLACEMENT TOTAL KNEE  2005  . VITRECTOMY Left 2006    Social History   Socioeconomic History  . Marital status: Single    Spouse name: Not on file  . Number of children: 0  . Years of education: Not on file  . Highest education level: Not on file  Occupational History  . 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 Financial resource strain: Not on file  . Food insecurity    Worry: Not on file    Inability: Not on file  . Transportation needs    Medical: Not on file    Non-medical: Not on file  Tobacco Use  . Smoking status: Never Smoker  . Smokeless tobacco: Never Used  Substance and Sexual Activity  . Alcohol use: Yes    Alcohol/week: 0.0 standard drinks    Comment: occasional  . Drug use: No  . Sexual activity: Not on file  Lifestyle  . Physical activity    Days per week: Not on file    Minutes per session: Not on file  . Stress: Not on file  Relationships  . Social Herbalist on phone: Not on file    Gets together: Not on file    Attends religious service: Not on file    Active member of club or organization: Not on file  Attends meetings of clubs or organizations: Not on file    Relationship status: Not on file  . Intimate partner violence    Fear of current or ex partner: Not on file    Emotionally abused: Not on file    Physically abused: Not on file    Forced sexual activity: Not on file  Other Topics Concern  . Not on file  Social History Narrative   Daily Caffeine.  Lives at home,  With [facility].  Works at [employer].  [employer], post graduate.   Single, no kids.     Labs: 05/07/2018: TSH 1.49.  Creatinine 1.2, potassium 4.1, EGFR 57, BMP normal.  WBC 10.9, RBC 3.9, normal H&H, MCV 99.8, CBC otherwise normal.  Labs 09/01/2017: Serum glucose 112 mg, BUN 16, creatinine 0.9, EGFR greater than 60 ML, potassium 4.6.  HB 12.3/HCT 37.7, platelets 221, normal indicis.  Total cholesterol 153,  triglycerides 115, HDL 51, LDL 79.  Non-HDL cholesterol 102.  TSH normal.   Current Outpatient Medications on File Prior to Visit  Medication Sig Dispense Refill  . aspirin 81 MG tablet Take 81 mg by mouth daily.    Marland Kitchen BENICAR 20 MG tablet Take 20 mg by mouth daily.  3  . carbidopa-levodopa (SINEMET IR) 25-100 MG tablet Take 1 tablet by mouth 3 (three) times daily. 270 tablet 1  . COD LIVER OIL PO Take by mouth daily at 2 PM. One teaspoon    . fexofenadine (ALLEGRA) 180 MG tablet Take 180 mg by mouth daily.    . finasteride (PROSCAR) 5 MG tablet TAKE 1 TABLET DAILY AS DIRECTED.  3  . fluocinonide (LIDEX) 0.05 % external solution Apply 1 application topically 2 (two) times daily. Instil 2 gtts in ear twice weekly    . folic acid (FOLVITE) 944 MCG tablet Take 400 mcg by mouth daily.    . Garlic 9675 MG CAPS Take 1,000 mg by mouth 2 (two) times a day.     Marland Kitchen glucosamine-chondroitin 500-400 MG tablet Take 1 tablet by mouth 2 (two) times daily.    . hyoscyamine (LEVSIN SL) 0.125 MG SL tablet Place 0.125 mg under the tongue as needed for cramping.     . Multiple Vitamin (MULTIVITAMIN) tablet Take 1 tablet by mouth daily. Shaklee vitamin w/o iron    . Multiple Vitamins-Minerals (OCUVITE PRESERVISION PO) Take 1 tablet by mouth 2 (two) times daily.    . NON FORMULARY Juice Plus Orchard Blend  2 daily and Garden Blend 2 daily    . pravastatin (PRAVACHOL) 40 MG tablet Take 40 mg by mouth at bedtime.  2  . Probiotic Product (ALIGN PO) Take by mouth.    . silodosin (RAPAFLO) 8 MG CAPS capsule Take 8 mg by mouth daily with breakfast.    . Travoprost, BAK Free, (TRAVATAN) 0.004 % SOLN ophthalmic solution Place 1 drop into both eyes at bedtime.    . triamcinolone (NASACORT ALLERGY 24HR) 55 MCG/ACT AERO nasal inhaler Place 2 sprays into the nose daily.    . vitamin B-12 (CYANOCOBALAMIN) 1000 MCG tablet Take 1,000 mcg by mouth daily.     No current facility-administered medications on file prior to visit.     Review of Systems  Constitutional: Negative for malaise/fatigue and weight loss.  Respiratory: Negative for cough, hemoptysis and shortness of breath.   Cardiovascular: Positive for leg swelling (bilateral mild edema and wears support stockings). Negative for chest pain, palpitations and claudication.  Gastrointestinal: Negative for  abdominal pain, blood in stool, constipation, heartburn and vomiting.  Genitourinary: Negative for dysuria.  Musculoskeletal: Positive for back pain and joint pain (right ankle from remote injury). Negative for myalgias.  Neurological: Positive for tremors. Negative for dizziness, focal weakness and headaches.  Endo/Heme/Allergies: Does not bruise/bleed easily.  Psychiatric/Behavioral: Negative for depression. The patient is not nervous/anxious.   All other systems reviewed and are negative.     Objective:  Blood pressure 133/77, pulse 74, height '5\' 7"'  (1.702 m), weight 202 lb (91.6 kg), SpO2 98 %. Body mass index is 31.64 kg/m.  Physical Exam  Constitutional: He appears well-developed and well-nourished. No distress.  HENT:  Head: Atraumatic.  Eyes: Conjunctivae are normal.  Neck: Neck supple. No JVD present. No thyromegaly present.  Cardiovascular: Normal rate, regular rhythm, normal heart sounds and intact distal pulses. Exam reveals no gallop.  No murmur heard. Bilateral varicose veins right more prominent noted.  2+ right ankle edema and leg edema, pitting and trace left leg edema.  Pulmonary/Chest: Effort normal and breath sounds normal.  Abdominal: Soft. Bowel sounds are normal.  Musculoskeletal: Normal range of motion.  Neurological: He is alert.  Skin: Skin is warm and dry.  Psychiatric: He has a normal mood and affect.   CARDIAC STUDIES:   Event Monitor 30 days 2018/05/18:  Predominant rhythm is sinus rhythm with first-degree AV block. No symptoms reported. One 4 beat VT at 8:46PM, Occasional PVCs.  Sleep study 03/31/2014 Positive for Sleep  Apnea, follows Dr. Paulla Dolly myoview stress test 06/21/2018:  1. Lexiscan stress test was performed. Exercise capacity was not assessed. No stress symptoms reported. Resting blood pressure was 136/72 mmHg and peak effect blood pressure was 140/70 mmHg. The resting and stress electrocardiogram demonstrated normal sinus rhythm, RBBB + LAHB, occasional PVC and normal rest repolarization.  Stress EKG is non diagnostic for ischemia as it is a pharmacologic stress.  2. The overall quality of the study is good. There is no evidence of abnormal lung activity. Stress and rest SPECT images demonstrate homogeneous tracer distribution throughout the myocardium. Gated SPECT imaging reveals mild global decrease in myocardial thickening and wall motion. The left ventricular ejection fraction was normal (46%).   3. High risk study due to reduced LVEF. No evidence of ischemia/ infarction.  Lexiscan myoview 06/16/2014: SPECT images demonstrate a moderate-sized mild ischemia extending from the base towards the apex in the septal wall. Left ventricle systolic function was Calculated at 50%. There was mild septal hypokinesis. This represents an intermediate risk scan.  Assessment & Recommendations:   Trifascicular block - Plan: EKG 12-Lead  Essential hypertension  EKG 03/17/2019: Sinus rhythm with first-degree AV block at the rate of 73 bpm, left axis deviation, left anterior fascicular block.  Right bundle branch block.  Trifascicular block.  Cannot exclude inferior infarct old, lateral infarct old. Non specific T change. No significant change from  EKG May 18, 2018   Recommendation:    Nicholas Robbins is a fairly active 85 y.o. Caucasian male with hypertension, trifascicular block on EKG, presents here for follow-up of heart block.  Blood pressure is well controlled, he remains asymptomatic. BP is controlled.  He has had one episode of syncope in January 2019 with no recurrence, event monitor at that time had  revealed 4 beat NSVT but no higher degree AV block than trifascicular block.  Nuclear stress test at that time was nonischemic.  I will continue to follow him on a 37-monthbasis unless he has any dizziness or  syncope I will see him back sooner.  Adrian Prows, MD, [facility] 03/17/2019, 1:50 PM [facility]. [place] Pager: [phone] Office: [phone]  If no answer Cell [phone]

## 2019-04-11 ENCOUNTER — Telehealth: Payer: Self-pay | Admitting: Adult Health

## 2019-04-11 ENCOUNTER — Other Ambulatory Visit: Payer: Self-pay | Admitting: Neurology

## 2019-04-11 MED ORDER — CARBIDOPA-LEVODOPA 25-100 MG PO TABS: 1.0000 | ORAL_TABLET | Freq: Three times a day (TID) | ORAL | 1 refills | Status: DC

## 2019-04-11 NOTE — Telephone Encounter (Signed)
I spoke to pt and relayed that saw Dr. Carles Collet as 2nd opinion.  We will refill.  Done.

## 2019-04-11 NOTE — Telephone Encounter (Signed)
Pt called stating that he used to get his carbidopa-levodopa (SINEMET IR) 25-100 MG tablet from Dr. Doristine Devoid office and now they are refusing to fill his prescription. Pt would like to be advised on what to do about this.

## 2019-05-20 ENCOUNTER — Ambulatory Visit: Payer: Medicare Other | Attending: Internal Medicine

## 2019-05-20 DIAGNOSIS — Z23 Encounter for immunization: Secondary | ICD-10-CM | POA: Insufficient documentation

## 2019-05-20 NOTE — Progress Notes (Signed)
   Covid-19 Vaccination Clinic  Name:  Nicholas Robbins    MRN: [id number] DOB: 1929-01-26  05/20/2019  [name] was observed post Covid-19 immunization for 15 minutes without incidence. He was provided with Vaccine Information Sheet and instruction to access the V-Safe system.   [name] was instructed to call 911 with any severe reactions post vaccine: Marland Kitchen Difficulty breathing  . Swelling of your face and throat  . A fast heartbeat  . A bad rash all over your body  . Dizziness and weakness    Immunizations Administered    Name Date Dose VIS Date Route   Pfizer COVID-19 Vaccine 05/20/2019  3:31 PM 0.3 mL 04/08/2019 Intramuscular   Manufacturer: [employer], [employer]   Lot: [id number]   NDC: [address]-6

## 2019-05-26 ENCOUNTER — Telehealth: Payer: Self-pay | Admitting: Neurology

## 2019-06-02 ENCOUNTER — Other Ambulatory Visit: Payer: Self-pay | Admitting: Neurology

## 2019-06-06 ENCOUNTER — Telehealth: Payer: Self-pay | Admitting: Adult Health

## 2019-06-06 MED ORDER — MODAFINIL 100 MG PO TABS: 100.0000 mg | ORAL_TABLET | Freq: Every day | ORAL | 3 refills | Status: DC

## 2019-06-06 NOTE — Telephone Encounter (Signed)
Pt is asking for a new prescription for MODAFINIL, pt states this wa prescribed to him back in 2019 for his getting sleepy when driving.  Please call pt to discuss

## 2019-06-06 NOTE — Telephone Encounter (Signed)
Modafinil is well tolerated and I am Ok with him taking this medication as needed.  Dr Jacinto Halim had no objection when we discussed this medication 2 years ago.The patient is well aware that the medication should not be taken within 6 hours of bedtime.   Melvyn Novas, MD

## 2019-06-06 NOTE — Addendum Note (Signed)
Addended by: Melvyn Novas on: 06/06/2019 04:20 PM   Modules accepted: Orders

## 2019-06-09 NOTE — Telephone Encounter (Signed)
PA submitted through cover my meds/optum RX for the pt.  OOI:[id number] Will await response

## 2019-06-09 NOTE — Telephone Encounter (Signed)
PA approved for the patient until 11/2019 [id number].

## 2019-06-09 NOTE — Telephone Encounter (Signed)
Patient called in and stated he needs a PA for his Modafinil through [facility]   [facility] - [phone]  Member ID: [id number]  RX BIN : [id number]  RX PCN: [id number]  RX GRP: [id number]

## 2019-06-10 ENCOUNTER — Ambulatory Visit: Payer: Medicare Other | Attending: Internal Medicine

## 2019-06-10 DIAGNOSIS — Z23 Encounter for immunization: Secondary | ICD-10-CM

## 2019-06-10 NOTE — Progress Notes (Signed)
   Covid-19 Vaccination Clinic  Name:  Nicholas Robbins    MRN: [id number] DOB: 12/21/1928  06/10/2019  [name] was observed post Covid-19 immunization for 15 minutes without incidence. He was provided with Vaccine Information Sheet and instruction to access the V-Safe system.   [name] was instructed to call 911 with any severe reactions post vaccine: Marland Kitchen Difficulty breathing  . Swelling of your face and throat  . A fast heartbeat  . A bad rash all over your body  . Dizziness and weakness    Immunizations Administered    Name Date Dose VIS Date Route   Pfizer COVID-19 Vaccine 06/10/2019  2:32 PM 0.3 mL 04/08/2019 Intramuscular   Manufacturer: [employer], [employer]   Lot: [id number]   NDC: [address]-9

## 2019-09-11 ENCOUNTER — Encounter: Payer: Self-pay | Admitting: Neurology

## 2019-09-13 ENCOUNTER — Ambulatory Visit: Payer: Medicare Other | Admitting: Adult Health

## 2019-09-13 ENCOUNTER — Encounter: Payer: Self-pay | Admitting: Adult Health

## 2019-09-13 ENCOUNTER — Other Ambulatory Visit: Payer: Self-pay

## 2019-09-13 VITALS — BP 142/87 | HR 85 | Ht 68.0 in | Wt 198.0 lb

## 2019-09-13 DIAGNOSIS — G4733 Obstructive sleep apnea (adult) (pediatric): Secondary | ICD-10-CM

## 2019-09-13 DIAGNOSIS — G20 Parkinson's disease: Secondary | ICD-10-CM | POA: Diagnosis not present

## 2019-09-13 DIAGNOSIS — Z9989 Dependence on other enabling machines and devices: Secondary | ICD-10-CM

## 2019-09-13 NOTE — Progress Notes (Signed)
PATIENT: Nicholas Robbins DOB: 1928-09-16  REASON FOR VISIT: follow up HISTORY FROM: patient  HISTORY OF PRESENT ILLNESS: Today 09/13/19: Nicholas Robbins is an 86 year old male with a history of Parkinson disease and obstructive sleep apnea on CPAP.  His download indicates that he uses machine nightly for compliance of 100%.  He uses machine greater than 4 hours each night.  On average he uses his machine 7 hours and 15 minutes.  His residual AHI is 7.7 on 5 to 16 cm of water with EPR of 1.  Leak in the 95th percentile is 35.4 L/min.  He remains on modafinil but only uses this when he is traveling.  He continues to take Sinemet 1 tablet 3 times a day.  Reports that he often misses the second dose.  Reports that he continues to have a tremor in the left hand.  Denies any significant changes with his gait or balance.  He does use a cane.  Reports that he is a little more stiff at times.  Denies any trouble chewing or swallowing food.    HISTORY 03/15/19:  Nicholas Robbins is an 86 year old male with a history of obstructive sleep apnea on CPAP and Parkinson's disease.  He returns today for follow-up.  His download indicates that he uses his machine nightly for compliance of 100%.  He uses machine greater than 4 hours each night.  On average he uses his machine 7 hours and 3 minutes.  His residual AHI is 4.6 on 5.6 to 16 cm of water with EPR of 1.  His leak in the 95th percentile is 38 L/min.  He states that he does not change out his supplies regularly.  On occasion he does feel the mask leaking.He did see Dr. Carles Collet for second opinion regarding his Parkinson's.  I have reviewed her note.  She recommended that he continue on Sinemet 3 times a day.  The patient has a tremor in the left hand.  He denies any changes with his gait or balance.  Denies any falls.  Denies any trouble chewing or swallowing food.  Denies any trouble sleeping.  He returns today for an evaluation.  REVIEW OF SYSTEMS: Out of a complete 14  system review of symptoms, the patient complains only of the following symptoms, and all other reviewed systems are negative.  FSS 22 ESS 12  ALLERGIES: No Known Allergies  HOME MEDICATIONS: Outpatient Medications Prior to Visit  Medication Sig Dispense Refill  . aspirin 81 MG tablet Take 81 mg by mouth daily.    Marland Kitchen BENICAR 20 MG tablet Take 20 mg by mouth daily.  3  . carbidopa-levodopa (SINEMET IR) 25-100 MG tablet Take 1 tablet by mouth 3 (three) times daily. 270 tablet 1  . COD LIVER OIL PO Take by mouth daily at 2 PM. One teaspoon    . fexofenadine (ALLEGRA) 180 MG tablet Take 180 mg by mouth daily.    . finasteride (PROSCAR) 5 MG tablet TAKE 1 TABLET DAILY AS DIRECTED.  3  . fluocinonide (LIDEX) 0.05 % external solution Apply 1 application topically 2 (two) times daily. Instil 2 gtts in ear twice weekly    . folic acid (FOLVITE) 381 MCG tablet Take 400 mcg by mouth daily.    . Garlic 0175 MG CAPS Take 1,000 mg by mouth 2 (two) times a day.     Marland Kitchen glucosamine-chondroitin 500-400 MG tablet Take 1 tablet by mouth 2 (two) times daily.    . hyoscyamine (LEVSIN SL)  0.125 MG SL tablet Place 0.125 mg under the tongue as needed for cramping.     . modafinil (PROVIGIL) 100 MG tablet Take 1 tablet (100 mg total) by mouth daily. 30 tablet 3  . Multiple Vitamin (MULTIVITAMIN) tablet Take 1 tablet by mouth daily. Shaklee vitamin w/o iron    . Multiple Vitamins-Minerals (OCUVITE PRESERVISION PO) Take 1 tablet by mouth 2 (two) times daily.    . NON FORMULARY Juice Plus Orchard Blend  2 daily and Garden Blend 2 daily    . pravastatin (PRAVACHOL) 40 MG tablet Take 40 mg by mouth at bedtime.  2  . Probiotic Product (ALIGN PO) Take by mouth.    . silodosin (RAPAFLO) 8 MG CAPS capsule Take 8 mg by mouth daily with breakfast.    . Travoprost, BAK Free, (TRAVATAN) 0.004 % SOLN ophthalmic solution Place 1 drop into both eyes at bedtime.    . triamcinolone (NASACORT ALLERGY 24HR) 55 MCG/ACT AERO nasal  inhaler Place 2 sprays into the nose daily.    . vitamin B-12 (CYANOCOBALAMIN) 1000 MCG tablet Take 1,000 mcg by mouth daily.     No facility-administered medications prior to visit.    PAST MEDICAL HISTORY: Past Medical History:  Diagnosis Date  . Arthritis   . Asthma   . Benign essential tremor   . BPH (benign prostatic hyperplasia)   . Central serous retinopathy   . COAG (chronic open-angle glaucoma)   . Colon polyps   . Diverticulitis   . Essential hypertension 06/22/2018  . Glaucoma   . Hypercholesterolemia 06/22/2018  . Hyperlipidemia   . Hypertension   . Macular degeneration   . Macular hole   . NSVT (nonsustained ventricular tachycardia) ([facility]) 06/22/2018   Event Monitor 30 days 05/10/2018: Performed for syncope Predominant rhythm is sinus rhythm with first-degree AV block. No symptoms reported. One 4 beat VT at 8:46PM, Occasional PVCs.  . OSA on CPAP    use CPAP nightly  . Pseudophakia of both eyes   . Syncope and collapse 05/07/2018  . 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 BACK SURGERY     lumbar fusion  . broken ankle  1961  . CARPAL TUNNEL RELEASE Right 10/15/2017   Procedure: RIGHT CARPAL TUNNEL RELEASE;  Surgeon: Cindee Salt, MD;  Location: [facility];  Service: Orthopedics;  Laterality: Right;  . CATARACT EXTRACTION, BILATERAL    . HERNIA REPAIR    . REPLACEMENT TOTAL KNEE  2005  . VITRECTOMY Left 2006    FAMILY HISTORY: Family History  Problem Relation Age of Onset  . Hypertension Mother   . Stroke Mother   . Ulcers Father 28       bleeding/ deceased  . Lymphoma Brother        cancer  . Breast cancer Sister   . Colon cancer Neg Hx   . Stomach cancer Neg Hx   . Rectal cancer Neg Hx   . Esophageal cancer Neg Hx   . Liver cancer Neg Hx     SOCIAL HISTORY: Social History   Socioeconomic History  . Marital status: Single    Spouse name: Not on file  . Number  of children: 0  . Years of education: Not on file  . Highest education level: Not on file  Occupational History  . 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 Smoking status: Never Smoker  . Smokeless tobacco: Never Used  Substance  and Sexual Activity  . Alcohol use: Yes    Alcohol/week: 0.0 standard drinks    Comment: occasional  . Drug use: No  . Sexual activity: Not on file  Other Topics Concern  . Not on file  Social History Narrative   Daily Caffeine.  Lives at home,  With [facility].  Works at [employer].  [employer]il[employer]er, post graduate.   Single, no kids.     Social Determinants of Health   Financial Resource Strain:   . Difficulty of Paying Living Expenses:   Food Insecurity:   . Worried About Programme researcher, broadcasting/film/video in the Last Year:   . Barista in the Last Year:   Transportation Needs:   . Freight forwarder (Medical):   Marland Kitchen Lack of Transportation (Non-Medical):   Physical Activity:   . Days of Exercise per Week:   . Minutes of Exercise per Session:   Stress:   . Feeling of Stress :   Social Connections:   . Frequency of Communication with Friends and Family:   . Frequency of Social Gatherings with Friends and Family:   . Attends Religious Services:   . Active Member of Clubs or Organizations:   . Attends Banker Meetings:   Marland Kitchen Marital Status:   Intimate Partner Violence:   . Fear of Current or Ex-Partner:   . Emotionally Abused:   Marland Kitchen Physically Abused:   . Sexually Abused:       PHYSICAL EXAM  Vitals:   09/13/19 0848  BP: (!) 142/87  Pulse: 85  Weight: 198 lb (89.8 kg)  Height: 5\' 8"  (1.727 m)   Body mass index is 30.11 kg/m.  Generalized: Well developed, in no acute distress  Chest: Lungs clear to auscultation bilaterally  Neurological examination  Mentation: Alert oriented to time, place, history taking. Follows all commands speech and language fluent Cranial nerve II-XII: Extraocular movements were  full, visual field were full on confrontational test Head turning and shoulder shrug  were normal and symmetric. Motor: The motor testing reveals 5 over 5 strength of all 4 extremities. Good symmetric motor tone is noted throughout.  Moderate impairment of finger taps on the right.  Severe impairment on the left.  Moderate impairment of toe taps bilaterally.  Resting tremor in the left hand noted. Sensory: Sensory testing is intact to soft touch on all 4 extremities. No evidence of extinction is noted.  Gait and station: Patient is able to stand by pushing off the chair.  Uses a cane when ambulating.  Good stride is noted.   DIAGNOSTIC DATA (LABS, IMAGING, TESTING) - I reviewed patient records, labs, notes, testing and imaging myself where available.    ASSESSMENT AND PLAN 86 y.o. 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 here with:  OSA on CPAP   CPAP compliance excellent  Residual AHI is slightly elevated-no changes for now  Encourage patient to use CPAP nightly and > 4 hours each night   Parkinson's disease   Continue Sinemet IR 3 times a day  Advised if symptoms worsen or he develops new symptoms he should let 06/24/2018 know Follow-up in 6 months  or sooner if needed   I spen t30 minutes of face-to-face and non-face-to-face time with patient.  This included previsit chart review, lab review, study review, order entry, electronic health record documentation, patient education.  Butch Penny, MSN,  NP-C 09/13/2019, 9:09 AM [facility] [address] [place], [place] [address] [phone]

## 2019-09-13 NOTE — Patient Instructions (Signed)
Your Plan: ° °Continue CPAP °Continue Sinemet °If your symptoms worsen or you develop new symptoms please let us know.  ° °Thank you for coming to see us at [facility]. I hope we have been able to provide you high quality care today. ° °You may receive a patient satisfaction survey over the next few weeks. We would appreciate your feedback and comments so that we may continue to improve ourselves and the health of our patients. ° °

## 2019-09-15 ENCOUNTER — Ambulatory Visit: Payer: Medicare Other | Admitting: Cardiology

## 2019-09-17 NOTE — Progress Notes (Signed)
Primary Physician/Referring:  Rodrigo Ran, MD  Patient ID: Nicholas Robbins, male    DOB: 1928-10-02, 86 y.o.   MRN: [id number]  No chief complaint on file.  HPI:    Nicholas Robbins  is a 86 y.o. 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 Event monitor in January 2020 did not reveal any high degree AV block but had 4 beat NSVT, nuclear stress test on 08/20/2018 was nonischemic.  He is here for follow-up of trifascicular block.   Continues to remain active, denies fatigue, dizziness or syncope. Denies chest pain or palpitations.  He does have Parkinson's disease and also has chronic back pain and bilateral knee pain.  Past Medical History:  Diagnosis Date  . Arthritis   . Asthma   . Benign essential tremor   . BPH (benign prostatic hyperplasia)   . Central serous retinopathy   . COAG (chronic open-angle glaucoma)   . Colon polyps   . Diverticulitis   . Essential hypertension 06/22/2018  . Glaucoma   . Hypercholesterolemia 06/22/2018  . Hyperlipidemia   . Hypertension   . Macular degeneration   . Macular hole   . NSVT (nonsustained ventricular tachycardia) (HCC) 06/22/2018   Event Monitor 30 days 05/10/2018: Performed for syncope Predominant rhythm is sinus rhythm with first-degree AV block. No symptoms reported. One 4 beat VT at 8:46PM, Occasional PVCs.  . OSA on CPAP    use CPAP nightly  . Pseudophakia of both eyes   . Syncope and collapse 05/07/2018  . 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 BACK SURGERY     lumbar fusion  . broken ankle  1961  . CARPAL TUNNEL RELEASE Right 10/15/2017   Procedure: RIGHT CARPAL TUNNEL RELEASE;  Surgeon: Cindee Salt, MD;  Location: [facility];  Service: Orthopedics;  Laterality: Right;  . CATARACT EXTRACTION,  BILATERAL    . HERNIA REPAIR    . REPLACEMENT TOTAL KNEE  2005  . VITRECTOMY Left 2006   Family History  Problem Relation Age of Onset  . Hypertension Mother   . Stroke Mother   . Ulcers Father 46       bleeding/ deceased  . Lymphoma Brother        cancer  . Breast cancer Sister   . Colon cancer Neg Hx   . Stomach cancer Neg Hx   . Rectal cancer Neg Hx   . Esophageal cancer Neg Hx   . Liver cancer Neg Hx     Social History   Tobacco Use  . Smoking status: Never Smoker  . Smokeless tobacco: Never Used  Substance Use Topics  . Alcohol use: Yes    Alcohol/week: 0.0 standard drinks    Comment: occasional   Marital Status: Single  ROS  Review of Systems  Cardiovascular: Negative for dyspnea on exertion, leg swelling and syncope.  Musculoskeletal: Positive for back pain.  Gastrointestinal: Negative for melena.  Neurological: Positive for tremors.   Objective  There were no vitals taken for this visit.  Vitals with BMI 09/13/2019 03/17/2019 03/15/2019  Height 5\' 8"  5\' 7"  5\' 7"   Weight 198 lbs 202 lbs 204 lbs 3 oz  BMI 30.11 31.63 31.97  Systolic 142 133  Diastolic 87 77 74  Pulse 85 74  73     Physical Exam  Constitutional: He appears well-developed and well-nourished. No distress.  Cardiovascular: Normal rate, regular rhythm and intact distal pulses. Exam reveals no gallop.  No murmur heard. No leg edema, no JVD.   Pulmonary/Chest: Effort normal and breath sounds normal. No accessory muscle usage.  Abdominal: Soft. Bowel sounds are normal.   Laboratory examination:   External labs:   Na/K 139/4.7  07/14/2019  Cholesterol, total 178.000 07/14/2019 Triglycerides 251.000 07/14/2019 HDL 39.000 07/14/2019 LDL-C 96.000 07/14/2019  BUN 22.000 07/14/2019 Creatinine, Serum 1.210 07/14/2019  A1C 5.700 07/14/2019  Glucose Random 117.000 07/14/2019  MicroAlbumin Urine 23.950 09/29/2018 MicroAlbumin/Creat 208.300 09/29/2018  PSA 7.850 06/17/2016  Medications and  allergies  No Known Allergies   Current Outpatient Medications  Medication Instructions  . aspirin 81 mg, Oral, Daily  . Benicar 10 mg, Oral, Daily, Take 1/2 tablet daily.  . carbidopa-levodopa (SINEMET IR) 25-100 MG tablet 1 tablet, Oral, 3 times daily  . COD LIVER OIL PO Oral, Daily, One teaspoon   . fexofenadine (ALLEGRA) 180 mg, Oral, Daily  . finasteride (PROSCAR) 5 MG tablet TAKE 1 TABLET DAILY AS DIRECTED.  . fluocinonide (LIDEX) 0.05 % external solution 1 application, Topical, 2 times daily, Instil 2 gtts in ear twice weekly   . folic acid (FOLVITE) 397 mcg, Oral, Daily  . Garlic 6,734 mg, Oral, 2 times daily  . glucosamine-chondroitin 500-400 MG tablet 1 tablet, Oral, 2 times daily  . hyoscyamine (LEVSIN SL) 0.125 mg, Sublingual, As needed  . modafinil (PROVIGIL) 100 mg, Oral, Daily  . Multiple Vitamin (MULTIVITAMIN) tablet 1 tablet, Oral, Daily, Shaklee vitamin w/o iron   . Multiple Vitamins-Minerals (OCUVITE PRESERVISION PO) 1 tablet, Oral, 2 times daily  . NON FORMULARY Juice Plus Orchard Blend  2 daily and Garden Blend 2 daily   . pravastatin (PRAVACHOL) 40 mg, Oral, Daily at bedtime  . Probiotic Product (ALIGN PO) Oral  . silodosin (RAPAFLO) 8 mg, Oral, Daily with breakfast  . Travoprost, BAK Free, (TRAVATAN) 0.004 % SOLN ophthalmic solution 1 drop, Both Eyes, Daily at bedtime  . triamcinolone (NASACORT ALLERGY 24HR) 55 MCG/ACT AERO nasal inhaler 2 sprays, Nasal, Daily  . vitamin B-12 (CYANOCOBALAMIN) 1,000 mcg, Oral, Daily   Radiology:   No results found.  Cardiac Studies:   Sleep study 03/31/2014 Positive for Sleep Apnea, follows Dr. Paulla Dolly myoview 06/16/2014: SPECT images demonstrate a moderate-sized mild ischemia extending from the base towards the apex in the septal wall. Left ventricle systolic function was Calculated at 50%. There was mild septal hypokinesis. This represents an intermediate risk scan.  Event Monitor 30 days 05/10/2018:  Predominant  rhythm is sinus rhythm with first-degree AV block. No symptoms reported. One 4 beat VT at 8:46PM, Occasional PVCs.  Lexiscan myoview stress test 06/21/2018:  1. Lexiscan stress test was performed. Exercise capacity was not assessed. No stress symptoms reported. Resting blood pressure was 136/72 mmHg and peak effect blood pressure was 140/70 mmHg. The resting and stress electrocardiogram demonstrated normal sinus rhythm, RBBB + LAHB, occasional PVC and normal rest repolarization.  Stress EKG is non diagnostic for ischemia as it is a pharmacologic stress.  2. The overall quality of the study is good. There is no evidence of abnormal lung activity. Stress and rest SPECT images demonstrate homogeneous tracer distribution throughout the myocardium. Gated SPECT imaging reveals mild global decrease in myocardial thickening and wall motion. The left ventricular ejection fraction was normal (46%).   3. High risk study due to  reduced LVEF. No evidence of ischemia/ infarction.  Echocardiogram 07/12/2018:  Left ventricle cavity is normal in size. Moderate concentric hypertrophy of the left ventricle. Low normal LV systolic function. Abnormal septal  wall motion due to left bundle branch block. Doppler evidence of grade I (impaired) diastolic dysfunction, normal LAP. Calculated EF 51%.  Left atrial cavity is mildly dilated.  Right atrial cavity is mildly dilated.  Right ventricle cavity is mildly dilated. Normal right ventricular function.  Mild aortic valve leaflet calcification. No evidence of aortic valve stenosis. Trace aortic regurgitation.  Mild (Grade I) mitral regurgitation. Mild calcification of the mitral valve annulus.  Trace tricuspid regurgitation. Unable to estimate PA pressure due to absence/minimal TR signal.  No significant change since 06/15/2014.  EKG  EKG 09/19/2019: Sinus rhythm with first-degree AV block at the rate of 82 bpm, leftward enlargement, left axis deviation, left anterior  fascicular block.  Right bundle branch block.  Trifascicular block.  No significant change from 03/17/2019.   Assessment  No diagnosis found.   No orders of the defined types were placed in this encounter.   There are no discontinued medications.  Recommendations:   Nicholas Robbins is a fairly active 86 y.o. Caucasian male with hypertension, trifascicular block on EKG, presents here for follow-up of heart block.    No change in EKG, does have underlying bifascicular block.  No indication for pacemaker.  Blood pressure is well controlled, he remains asymptomatic.  He has had one episode of syncope in January 2019 with no recurrence, event monitor at that time had revealed 4 beat NSVT but no higher degree AV block than trifascicular block.  Nuclear stress test at that time was nonischemic.    I reviewed his external labs, lipids are also well controlled.  I will continue to follow him on a 37-month basis unless he has any dizziness or syncope I will see him back sooner.  Yates Decamp, MD, [facility] 09/17/2019, 3:21 PM [facility] [place] Pager: [phone] Office: [phone]

## 2019-09-19 ENCOUNTER — Ambulatory Visit: Payer: Medicare Other | Admitting: Cardiology

## 2019-09-19 ENCOUNTER — Other Ambulatory Visit: Payer: Self-pay

## 2019-09-19 ENCOUNTER — Encounter: Payer: Self-pay | Admitting: Cardiology

## 2019-09-19 VITALS — BP 124/79 | HR 84 | Temp 97.2°F | Resp 16 | Ht 67.0 in | Wt 202.0 lb

## 2019-09-19 DIAGNOSIS — I10 Essential (primary) hypertension: Secondary | ICD-10-CM

## 2019-09-19 DIAGNOSIS — E7800 Pure hypercholesterolemia, unspecified: Secondary | ICD-10-CM

## 2019-09-19 DIAGNOSIS — I453 Trifascicular block: Secondary | ICD-10-CM

## 2019-10-09 ENCOUNTER — Other Ambulatory Visit: Payer: Self-pay | Admitting: Adult Health

## 2019-11-29 ENCOUNTER — Telehealth: Payer: Self-pay

## 2019-11-29 NOTE — Telephone Encounter (Addendum)
Approved 11/29/2019 Request Reference Number: [id number]. MODAFINIL TAB 100MG  is approved through 05/31/2020. Your patient may now fill this prescription and it will be covered

## 2019-11-29 NOTE — Telephone Encounter (Signed)
Pa for Modafinil has been submitted to cover my meds.   KeyDanne Baxter - PA Case ID: [id number]. Waiting on determination.

## 2020-01-03 ENCOUNTER — Ambulatory Visit: Payer: Self-pay | Attending: Internal Medicine

## 2020-01-03 DIAGNOSIS — Z23 Encounter for immunization: Secondary | ICD-10-CM

## 2020-01-03 NOTE — Progress Notes (Signed)
   Covid-19 Vaccination Clinic  Name:  Nicholas Robbins    MRN: [id number] DOB: January 08, 1929  01/03/2020  [name] was observed post Covid-19 immunization for 15 minutes without incident. He was provided with Vaccine Information Sheet and instruction to access the V-Safe system.   [name] was instructed to call 911 with any severe reactions post vaccine: Marland Kitchen Difficulty breathing  . Swelling of face and throat  . A fast heartbeat  . A bad rash all over body  . Dizziness and weakness

## 2020-03-20 ENCOUNTER — Encounter: Payer: Self-pay | Admitting: Adult Health

## 2020-03-20 ENCOUNTER — Ambulatory Visit: Payer: Medicare Other | Admitting: Adult Health

## 2020-03-20 ENCOUNTER — Ambulatory Visit: Payer: Medicare Other | Admitting: Neurology

## 2020-03-20 ENCOUNTER — Telehealth: Payer: Self-pay | Admitting: Adult Health

## 2020-03-20 VITALS — BP 128/82 | HR 71 | Ht 67.0 in | Wt 203.4 lb

## 2020-03-20 DIAGNOSIS — Z9989 Dependence on other enabling machines and devices: Secondary | ICD-10-CM

## 2020-03-20 DIAGNOSIS — G4733 Obstructive sleep apnea (adult) (pediatric): Secondary | ICD-10-CM

## 2020-03-20 DIAGNOSIS — G20 Parkinson's disease: Secondary | ICD-10-CM

## 2020-03-20 MED ORDER — CARBIDOPA-LEVODOPA 25-100 MG PO TABS: 1.0000 | ORAL_TABLET | Freq: Three times a day (TID) | ORAL | 1 refills | Status: DC

## 2020-03-20 MED ORDER — MODAFINIL 100 MG PO TABS: 150.0000 mg | ORAL_TABLET | Freq: Every day | ORAL | 3 refills | Status: DC

## 2020-03-20 NOTE — Patient Instructions (Signed)
Your Plan:  Continue Sinemet Continue using CPAP nightly and greater than 4 hours each night If your symptoms worsen or you develop new symptoms please let us know.   Thank you for coming to see [place] at [facility]. I hope we have been able to provide you high quality care today.  You may receive a patient satisfaction survey over the next few weeks. We would appreciate your feedback and comments so that we may continue to improve ourselves and the health of our patients.

## 2020-03-20 NOTE — Progress Notes (Signed)
PATIENT: Nicholas Robbins DOB: 02/19/1929  REASON FOR VISIT: follow up HISTORY FROM: patient  HISTORY OF PRESENT ILLNESS: Today 03/20/20:  Nicholas Robbins is an 86 year old male with a history of Parkinson's disease and obstructive sleep apnea on CPAP.  He returns today for follow-up.  His CPAP download indicates that he uses machine nightly for compliance of 100%.  He uses machine greater than 4 hours each night.  On average he uses his machine 6 hours and 49 minutes.  His residual AHI is 14.3 on 5.6-16 centimeters of water.  Leak in the 95th percentile is 35.9 L/min.  Reports that he continues to take Provigil but only takes it on an as-needed basis.  He does feel that it is not strong enough states that he gets still get sleepy especially when driving.  States that he does have the mask on relatively tight.  He has nasal pillows.  States that there are several nights that he wakes up with bubbles in his mouth.  He remains on Sinemet 1 tablet 3 times a day.  Tremor in the left hand.  Denies any significant changes with his gait or balance.  Uses a cane when ambulating.  Able to complete all ADLs independently.  Denies any trouble chewing or swallowing food.  Returns today for an evaluation.  HISTORY 09/13/19: Nicholas Robbins is an 86 year old male with a history of Parkinson disease and obstructive sleep apnea on CPAP.  His download indicates that he uses machine nightly for compliance of 100%.  He uses machine greater than 4 hours each night.  On average he uses his machine 7 hours and 15 minutes.  His residual AHI is 7.7 on 5 to 16 cm of water with EPR of 1.  Leak in the 95th percentile is 35.4 L/min.  He remains on modafinil but only uses this when he is traveling.  He continues to take Sinemet 1 tablet 3 times a day.  Reports that he often misses the second dose.  Reports that he continues to have a tremor in the left hand.  Denies any significant changes with his gait or balance.  He does use a cane.   Reports that he is a little more stiff at times.  Denies any trouble chewing or swallowing food.  REVIEW OF SYSTEMS: Out of a complete 14 system review of symptoms, the patient complains only of the following symptoms, and all other reviewed systems are negative.  See HPI  ALLERGIES: No Known Allergies  HOME MEDICATIONS: Outpatient Medications Prior to Visit  Medication Sig Dispense Refill  . aspirin 81 MG tablet Take 81 mg by mouth daily.    Marland Kitchen BENICAR 20 MG tablet Take 10 mg by mouth daily. Take 1/2 tablet daily.  3  . carbidopa-levodopa (SINEMET IR) 25-100 MG tablet TAKE 1 TABLET BY MOUTH THREE TIMES A DAY 270 tablet 1  . COD LIVER OIL PO Take by mouth daily at 2 PM. One teaspoon    . fexofenadine (ALLEGRA) 180 MG tablet Take 180 mg by mouth daily.    . finasteride (PROSCAR) 5 MG tablet TAKE 1 TABLET DAILY AS DIRECTED.  3  . fluocinonide (LIDEX) 0.05 % external solution Apply 1 application topically 2 (two) times daily. Instil 2 gtts in ear twice weekly    . folic acid (FOLVITE) 800 MCG tablet Take 400 mcg by mouth daily.    . Garlic 1000 MG CAPS Take 1,000 mg by mouth 2 (two) times a day.     Marland Kitchen  glucosamine-chondroitin 500-400 MG tablet Take 1 tablet by mouth 2 (two) times daily.    . hyoscyamine (LEVSIN SL) 0.125 MG SL tablet Place 0.125 mg under the tongue as needed for cramping.     . modafinil (PROVIGIL) 100 MG tablet Take 1 tablet (100 mg total) by mouth daily. 30 tablet 3  . Multiple Vitamin (MULTIVITAMIN) tablet Take 1 tablet by mouth daily. Shaklee vitamin w/o iron    . Multiple Vitamins-Minerals (OCUVITE PRESERVISION PO) Take 1 tablet by mouth 2 (two) times daily.    . NON FORMULARY Juice Plus Orchard Blend  2 daily and Garden Blend 2 daily    . pravastatin (PRAVACHOL) 40 MG tablet Take 40 mg by mouth at bedtime.  2  . Probiotic Product (ALIGN PO) Take by mouth.    . silodosin (RAPAFLO) 8 MG CAPS capsule Take 8 mg by mouth daily with breakfast.    . Travoprost, BAK Free,  (TRAVATAN) 0.004 % SOLN ophthalmic solution Place 1 drop into both eyes at bedtime.    . triamcinolone (NASACORT ALLERGY 24HR) 55 MCG/ACT AERO nasal inhaler Place 2 sprays into the nose daily.    . vitamin B-12 (CYANOCOBALAMIN) 1000 MCG tablet Take 1,000 mcg by mouth daily.     No facility-administered medications prior to visit.    PAST MEDICAL HISTORY: Past Medical History:  Diagnosis Date  . Arthritis   . Asthma   . Benign essential tremor   . BPH (benign prostatic hyperplasia)   . Central serous retinopathy   . COAG (chronic open-angle glaucoma)   . Colon polyps   . Diverticulitis   . Essential hypertension 06/22/2018  . Glaucoma   . Hypercholesterolemia 06/22/2018  . Hyperlipidemia   . Hypertension   . Macular degeneration   . Macular hole   . NSVT (nonsustained ventricular tachycardia) (HCC) 06/22/2018   Event Monitor 30 days 05/10/2018: Performed for syncope Predominant rhythm is sinus rhythm with first-degree AV block. No symptoms reported. One 4 beat VT at 8:46PM, Occasional PVCs.  . OSA on CPAP    use CPAP nightly  . Pseudophakia of both eyes   . Syncope and collapse 05/07/2018  . 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 BACK SURGERY     lumbar fusion  . broken ankle  1961  . CARPAL TUNNEL RELEASE Right 10/15/2017   Procedure: RIGHT CARPAL TUNNEL RELEASE;  Surgeon: Cindee Salt, MD;  Location: [facility];  Service: Orthopedics;  Laterality: Right;  . CATARACT EXTRACTION, BILATERAL    . HERNIA REPAIR    . REPLACEMENT TOTAL KNEE  2005  . VITRECTOMY Left 2006    FAMILY HISTORY: Family History  Problem Relation Age of Onset  . Hypertension Mother   . Stroke Mother   . Ulcers Father 72       bleeding/ deceased  . Lymphoma Brother        cancer  . Breast cancer Sister   . Colon cancer Neg Hx   . Stomach cancer Neg Hx   . Rectal cancer Neg Hx   . Esophageal cancer Neg Hx   .  Liver cancer Neg Hx     SOCIAL HISTORY: Social History   Socioeconomic History  . Marital status: Single    Spouse name: Not on file  . Number of children: 0  . Years of education: Not on file  . Highest education level: Not on file  Occupational History  .  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 Smoking status: Never Smoker  . Smokeless tobacco: Never Used  Vaping Use  . Vaping Use: Never used  Substance and Sexual Activity  . Alcohol use: Yes    Alcohol/week: 0.0 standard drinks    Comment: occasional  . Drug use: No  . Sexual activity: Not on file  Other Topics Concern  . Not on file  Social History Narrative   Daily Caffeine.  Lives at home,  With [facility]  Works at [employer].  Lillette Boxer, post graduate.   Single, no kids.     Social Determinants of Health   Financial Resource Strain:   . Difficulty of Paying Living Expenses: Not on file  Food Insecurity:   . Worried About Programme researcher, broadcasting/film/video in the Last Year: Not on file  . Ran Out of Food in the Last Year: Not on file  Transportation Needs:   . Lack of Transportation (Medical): Not on file  . Lack of Transportation (Non-Medical): Not on file  Physical Activity:   . Days of Exercise per Week: Not on file  . Minutes of Exercise per Session: Not on file  Stress:   . Feeling of Stress : Not on file  Social Connections:   . Frequency of Communication with Friends and Family: Not on file  . Frequency of Social Gatherings with Friends and Family: Not on file  . Attends Religious Services: Not on file  . Active Member of Clubs or Organizations: Not on file  . Attends Banker Meetings: Not on file  . Marital Status: Not on file  Intimate Partner Violence:   . Fear of Current or Ex-Partner: Not on file  . Emotionally Abused: Not on file  . Physically Abused: Not on file  . Sexually Abused: Not on file      PHYSICAL EXAM  Vitals:   03/20/20 0930  BP: 128/82   Pulse: 71  Weight: 203 lb 6.4 oz (92.3 kg)  Height: 5\' 7"  (1.702 m)   Body mass index is 31.86 kg/m.  Generalized: Well developed, in no acute distress   Neurological examination  Mentation: Alert oriented to time, place, history taking. Follows all commands speech and language fluent Cranial nerve II-XII: Pupils were equal round reactive to light. Extraocular movements were full, visual field were full on confrontational test. Head turning and shoulder shrug  were normal and symmetric. Motor: The motor testing reveals 5 over 5 strength of all 4 extremities. Good symmetric motor tone is noted throughout.  Moderate impairment of finger taps in the left hand.  Mild impairment on the right. Sensory: Sensory testing is intact to soft touch on all 4 extremities. No evidence of extinction is noted.  Coordination: Cerebellar testing reveals good finger-nose-finger and heel-to-shin bilaterally.  Gait and station: Patient uses a cane when ambulating.  Stooped posture.  Good stride. Reflexes: Deep tendon reflexes are symmetric and normal bilaterally.   DIAGNOSTIC DATA (LABS, IMAGING, TESTING) - I reviewed patient records, labs, notes, testing and imaging myself where available.     ASSESSMENT AND PLAN 86 y.o. 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 here  wih:  1.  Obstructive sleep apnea on CPAP  -Good compliance -Residual AHI elevated perhaps due to increased leak -Mask refitting ordered -We will  increase Provigil to 150 mg daily if needed -Encourage patient continue using CPAP nightly and greater than 4 hours each night  2.  Parkinson's disease  -Continue Sinemet 1 tablet 3 times a day  Follow-up in 6 months or sooner if needed   I spent 30  minutes of face-to-face and non-face-to-face time with patient.  This included previsit chart review, lab review, study review, order entry, electronic health record documentation, patient education.  Butch PennyMegan Dillyn Menna, MSN, NP-C 03/20/2020, 9:38 AM [facility] [address], [address] [place], [place] [address] [phone]

## 2020-03-20 NOTE — Progress Notes (Signed)
Order for mask refitting sent to [facility] via community msg. Confirmation received that the order transmitted was successful.

## 2020-03-20 NOTE — Telephone Encounter (Signed)
Received a PA request for modafinil 100mg . PA was started on . Key is [id number]. A determination will be received within the next 72 hours. Will check back later for a decision.

## 2020-03-21 ENCOUNTER — Ambulatory Visit: Payer: Medicare Other | Admitting: Cardiology

## 2020-03-21 NOTE — Telephone Encounter (Signed)
Received fax from [facility]. PA has been approved until 04/27/2021. Will fax a copy of the determination to patient's pharmacy.

## 2020-04-03 ENCOUNTER — Ambulatory Visit: Payer: Medicare Other | Admitting: Cardiology

## 2020-04-03 ENCOUNTER — Other Ambulatory Visit: Payer: Self-pay

## 2020-04-03 ENCOUNTER — Encounter: Payer: Self-pay | Admitting: Cardiology

## 2020-04-03 VITALS — BP 118/64 | HR 80 | Resp 16 | Ht 67.0 in | Wt 202.0 lb

## 2020-04-03 DIAGNOSIS — I453 Trifascicular block: Secondary | ICD-10-CM

## 2020-04-03 DIAGNOSIS — E7800 Pure hypercholesterolemia, unspecified: Secondary | ICD-10-CM

## 2020-04-03 DIAGNOSIS — I2510 Atherosclerotic heart disease of native coronary artery without angina pectoris: Secondary | ICD-10-CM

## 2020-04-03 DIAGNOSIS — I10 Essential (primary) hypertension: Secondary | ICD-10-CM

## 2020-04-03 NOTE — Progress Notes (Signed)
Primary Physician/Referring:  Rodrigo Ran, MD  Patient ID: Nicholas Robbins, male    DOB: 1928-05-30, 86 y.o.   MRN: [id number]  Chief Complaint  Patient presents with  . Trifascicular block  . Hypertension  . Follow-up   HPI:    Nicholas Robbins  is a 86 y.o. male who is very active and continues to live independently and works at [employer]. 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 Event monitor in January 2020 did not reveal any high degree AV block but had 4 beat NSVT, nuclear stress test on 08/20/2018 was nonischemic.  He is here for follow-up of trifascicular block and hypertension.   He was evaluated by his PCP Dr. Guillermina City and his Benicar dose was reduced from 20 mg daily to 10 mg daily due to dizziness and low blood pressure.  Since then he has had resolution of dizziness.  Continues to remain active, denies fatigue, dizziness or syncope. Denies chest pain or palpitations.  He does have Parkinson's disease and also has chronic back pain and bilateral knee pain.  Past Medical History:  Diagnosis Date  . Arthritis   . Asthma   . Benign essential tremor   . BPH (benign prostatic hyperplasia)   . Central serous retinopathy   . COAG (chronic open-angle glaucoma)   . Colon polyps   . Diverticulitis   . Essential hypertension 06/22/2018  . Glaucoma   . Hypercholesterolemia 06/22/2018  . Hyperlipidemia   . Hypertension   . Macular degeneration   . Macular hole   . NSVT (nonsustained ventricular tachycardia) (HCC) 06/22/2018   Event Monitor 30 days 05/10/2018: Performed for syncope Predominant rhythm is sinus rhythm with first-degree AV block. No symptoms reported. One 4 beat VT at 8:46PM, Occasional PVCs.  . OSA on CPAP    use CPAP nightly  . Pseudophakia of both eyes   . Syncope and collapse 05/07/2018  .  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 BACK SURGERY     lumbar fusion  . broken ankle  1961  . CARPAL TUNNEL RELEASE Right 10/15/2017   Procedure: RIGHT CARPAL TUNNEL RELEASE;  Surgeon: Cindee Salt, MD;  Location: [facility];  Service: Orthopedics;  Laterality: Right;  . CATARACT EXTRACTION, BILATERAL    . HERNIA REPAIR    . REPLACEMENT TOTAL KNEE  2005  . VITRECTOMY Left 2006   Family History  Problem Relation Age of Onset  . Hypertension Mother   . Stroke Mother   . Ulcers Father 57       bleeding/ deceased  . Lymphoma Brother        cancer  . Breast cancer Sister   . Colon cancer Neg Hx   . Stomach cancer Neg Hx   . Rectal cancer Neg Hx   . Esophageal cancer Neg Hx   . Liver cancer Neg Hx     Social History   Tobacco Use  . Smoking status: Never Smoker  . Smokeless tobacco: Never Used  Substance Use Topics  . Alcohol use: Yes    Alcohol/week: 0.0 standard drinks    Comment: occasional   Marital Status: Single  ROS  Review of Systems  Cardiovascular: Negative for dyspnea on exertion, leg swelling and syncope.  Musculoskeletal: Positive for back pain.  Gastrointestinal: Negative for melena.  Neurological: Positive for tremors.   Objective  Blood pressure 118/64, pulse 80, resp. rate 16, height 5\' 7"  (1.702 m), weight 202 lb (91.6 kg), SpO2 96 %.  Vitals with BMI 04/03/2020 03/20/2020 09/19/2019  Height 5\' 7"  5\' 7"  5\' 7"   Weight 202 lbs 203 lbs 6 oz 202 lbs  BMI 31.63 31.85 31.63  Systolic 118 128 09/21/2019  Diastolic 64 82 79  Pulse 80 71 84     Physical Exam Constitutional:      General: He is not in acute distress.    Appearance: He is well-developed.  Cardiovascular:     Rate and Rhythm: Normal rate and regular rhythm.     Pulses: Intact distal pulses.     Heart sounds: No murmur heard.  No gallop.      Comments: Trace ankle edema, no JVD.  Pulmonary:     Effort: Pulmonary effort is normal. No  accessory muscle usage.     Breath sounds: Normal breath sounds.  Abdominal:     General: Bowel sounds are normal.     Palpations: Abdomen is soft.    Laboratory examination:   External labs:     Cholesterol, total 159.000 m 11/03/2019 HDL 53 MG/DL LDL mg 11/03/2019 Triglycerides 106.000 11/03/2019  Hemoglobin 12.400 g/d 11/03/2019  Creatinine, Serum 1.100 mg/ 11/03/2019 ALT (SGPT) 12.000 uni 11/03/2019 TSH 2.370 11/03/2019  Na/K 139/4.7  07/14/2019  Cholesterol, total 178.000 07/14/2019 Triglycerides 251.000 07/14/2019 HDL 39.000 07/14/2019 LDL-C 96.000 07/14/2019  BUN 22.000 07/14/2019 Creatinine, Serum 1.210 07/14/2019  A1C 5.700 07/14/2019  Glucose Random 117.000 07/14/2019  MicroAlbumin Urine 23.950 09/29/2018 MicroAlbumin/Creat 208.300 09/29/2018  PSA 7.850 06/17/2016  Medications and allergies  No Known Allergies   Current Outpatient Medications  Medication Instructions  . aspirin 81 mg, Oral, Daily  . carbidopa-levodopa (SINEMET IR) 25-100 MG tablet 1 tablet, Oral, 3 times daily  . COD LIVER OIL PO Oral, Daily, One teaspoon   . fexofenadine (ALLEGRA) 180 mg, Oral, Daily  . finasteride (PROSCAR) 5 MG tablet TAKE 1 TABLET DAILY AS DIRECTED.  . fluocinonide (LIDEX) 0.05 % external solution 1 application, Topical, 2 times daily, Instil 2 gtts in ear twice weekly   . folic acid (FOLVITE) 400 mcg, Oral, Daily  . Garlic 1,000 mg, Oral, 2 times daily  . glucosamine-chondroitin 500-400 MG tablet 1 tablet, Oral, 2 times daily  . hyoscyamine (LEVSIN SL) 0.125 mg, Sublingual, As needed  . modafinil (PROVIGIL) 150 mg, Oral, Daily  . Multiple Vitamin (MULTIVITAMIN) tablet 1 tablet, Oral, Daily, Shaklee vitamin w/o iron   . Multiple Vitamins-Minerals (OCUVITE PRESERVISION PO) 1 tablet, Oral, 2 times daily  . NON FORMULARY Juice Plus Orchard Blend  2 daily and Garden Blend 2 daily   . pravastatin (PRAVACHOL) 40 mg, Oral, Daily at bedtime  . Probiotic Product (ALIGN PO) Oral   . silodosin (RAPAFLO) 8 mg, Oral, Daily with breakfast  . Travoprost, BAK Free, (TRAVATAN) 0.004 % SOLN ophthalmic solution 1 drop, Both Eyes, Daily at bedtime  . vitamin B-12 (CYANOCOBALAMIN) 1,000 mcg, Oral, Daily   Radiology:   No results found.  Cardiac Studies:   Sleep study 03/31/2014 Positive for Sleep Apnea, follows Dr. 11/29/2018 myoview 06/16/2014: SPECT images demonstrate a moderate-sized mild ischemia extending from the base towards the apex in the septal wall. Left ventricle systolic function was Calculated at 50%. There was mild septal hypokinesis. This represents an intermediate risk scan.  Event Monitor 30 days 05/10/2018:  Predominant rhythm is sinus rhythm with first-degree AV block. No symptoms reported. One 4 beat VT at 8:46PM, Occasional PVCs.  Lexiscan myoview stress test 06/21/2018:  1. Lexiscan stress test was performed. Exercise capacity was not assessed. No stress symptoms reported. Resting blood pressure was 136/72 mmHg and peak effect blood pressure was 140/70 mmHg. The resting and stress electrocardiogram demonstrated normal sinus rhythm, RBBB + LAHB, occasional PVC and normal rest repolarization.  Stress EKG is non diagnostic for ischemia as it is a pharmacologic stress.  2. The overall quality of the study is good. There is no evidence of abnormal lung activity. Stress and rest SPECT images demonstrate homogeneous tracer distribution throughout the myocardium. Gated SPECT imaging reveals mild global decrease in myocardial thickening and wall motion. The left ventricular ejection fraction was normal (46%).   3. High risk study due to reduced LVEF. No evidence of ischemia/ infarction.  Echocardiogram 07/12/2018:  Left ventricle cavity is normal in size. Moderate concentric hypertrophy of the left ventricle. Low normal LV systolic function. Abnormal septal  wall motion due to left bundle branch block. Doppler evidence of grade I (impaired) diastolic  dysfunction, normal LAP. Calculated EF 51%.  Left atrial cavity is mildly dilated.  Right atrial cavity is mildly dilated.  Right ventricle cavity is mildly dilated. Normal right ventricular function.  Mild aortic valve leaflet calcification. No evidence of aortic valve stenosis. Trace aortic regurgitation.  Mild (Grade I) mitral regurgitation. Mild calcification of the mitral valve annulus.  Trace tricuspid regurgitation. Unable to estimate PA pressure due to absence/minimal TR signal.  No significant change since 06/15/2014.  EKG    EKG 04/03/2020: Sinus rhythm with first-degree block at rate of 77 bpm, left axis deviation, left intrafascicular block.  Right bundle branch block.  Trifascicular block.  Poor R wave progression, cannot exclude anterolateral infarct old.  Unspecific T abnormality.   No significant change from EKG 09/19/2019,  03/17/2019.   Assessment     ICD-10-CM   1. Trifascicular block  I45.3   2. Essential hypertension  I10 EKG 12-Lead  3. Coronary artery calcification  I25.10    I25.84   4. Hypercholesterolemia  E78.00      No orders of the defined types were placed in this encounter.   Medications Discontinued During This Encounter  Medication Reason  . triamcinolone (NASACORT ALLERGY 24HR) 55 MCG/ACT AERO nasal inhaler Patient Preference  . BENICAR 20 MG tablet Discontinued by provider    Recommendations:   Nicholas Robbins is a fairly active 86 y.o. male who is very active and continues to live independently and works at 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 Event monitor in January 2020 did not reveal any high degree AV block but had 4 beat NSVT, nuclear stress test on 08/20/2018 was nonischemic.  He is here for follow-up of trifascicular block and hypertension.  His Benicar dose  was reduced to 10 mg due to borderline low blood pressure and dizziness.  I have completely discontinued this as he continues to have low blood pressure although completely asymptomatic.  Suspect low blood pressure may be related to underlying Parkinson's disease.  Lipids are well controlled.  I reviewed his external labs.  He does have coronary calcification and is presently on a statin and is tolerating this well.  With regard to trifascicular block,  no change in his EKG from previous, he has not had any syncope or exercise intolerance.  Hence continue observation for now, no indication for pacemaker implantation.  I will see him back in 6 months for follow-up.   Yates Decamp, MD, [facility] 04/03/2020, 3:34 PM Office: [phone] Pager: [phone]

## 2020-05-21 DIAGNOSIS — J189 Pneumonia, unspecified organism: Secondary | ICD-10-CM

## 2020-05-21 HISTORY — DX: Pneumonia, unspecified organism: J18.9

## 2020-05-30 ENCOUNTER — Encounter: Payer: Self-pay | Admitting: Cardiology

## 2020-05-30 ENCOUNTER — Other Ambulatory Visit: Payer: Self-pay | Admitting: Cardiology

## 2020-05-30 ENCOUNTER — Ambulatory Visit: Payer: Medicare Other | Admitting: Cardiology

## 2020-05-30 ENCOUNTER — Other Ambulatory Visit: Payer: Self-pay

## 2020-05-30 VITALS — BP 128/76 | HR 83 | Temp 97.9°F | Resp 16 | Ht 67.0 in | Wt 203.6 lb

## 2020-05-30 DIAGNOSIS — I453 Trifascicular block: Secondary | ICD-10-CM

## 2020-05-30 DIAGNOSIS — I5031 Acute diastolic (congestive) heart failure: Secondary | ICD-10-CM

## 2020-05-30 DIAGNOSIS — I10 Essential (primary) hypertension: Secondary | ICD-10-CM

## 2020-05-30 DIAGNOSIS — I4819 Other persistent atrial fibrillation: Secondary | ICD-10-CM

## 2020-05-30 NOTE — Patient Instructions (Addendum)
You need to have your blood work done at [facility] near my old office on [address] today and he can go to any [facility] in 10 days to 2 weeks to repeat the blood test again.  I am starting you on a blood thinner called Eliquis which you will take 1 tablet twice daily for atrial fibrillation.  You will also get an echocardiogram in the next few days, our office staff will set this up for you.  I will be seeing you back in the office in 2 weeks.  Please avoid excess salt, I would like you to lose weight, talk to your dining hall about low-salt diet.  Do not drink any fluids excessively.  You will weigh her self daily.

## 2020-05-30 NOTE — Progress Notes (Signed)
Primary Physician/Referring:  Rodrigo Ran, MD  Patient ID: Nicholas Robbins, male    DOB: [birth year], 87 y.o.   MRN: [id number]  No chief complaint on file.  HPI:    Nicholas Robbins  is a 87 y.o. male who is very active and continues to live independently and works at [employer]. 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 Event monitor in January 2020 did not reveal any high degree AV block but had 4 beat NSVT, nuclear stress test on 08/20/2018 was nonischemic.   He was evaluated by his PCP Dr. Guillermina City yesterday due to new onset A. fib he was referred to [place] in a stat basis.  Patient for the past 3 weeks has developed marked fatigue, dyspnea on exertion and also has noticed leg edema.  No dizziness or syncope or chest pain.     Past Medical History:  Diagnosis Date  . Arthritis   . Asthma   . Benign essential tremor   . BPH (benign prostatic hyperplasia)   . Central serous retinopathy   . COAG (chronic open-angle glaucoma)   . Colon polyps   . Diverticulitis   . Essential hypertension 06/22/2018  . Glaucoma   . Hypercholesterolemia 06/22/2018  . Hyperlipidemia   . Hypertension   . Macular degeneration   . Macular hole   . NSVT (nonsustained ventricular tachycardia) (HCC) 06/22/2018   Event Monitor 30 days 05/10/2018: Performed for syncope Predominant rhythm is sinus rhythm with first-degree AV block. No symptoms reported. One 4 beat VT at 8:46PM, Occasional PVCs.  . OSA on CPAP    use CPAP nightly  . Pneumonia 05/21/2020  . Pseudophakia of both eyes   . Syncope and collapse 05/07/2018  . 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 BACK SURGERY     lumbar fusion  . broken ankle  1961  . CARPAL TUNNEL RELEASE Right 10/15/2017   Procedure: RIGHT CARPAL TUNNEL RELEASE;  Surgeon:  Cindee Salt, MD;  Location: [facility];  Service: Orthopedics;  Laterality: Right;  . CATARACT EXTRACTION, BILATERAL    . HERNIA REPAIR    . REPLACEMENT TOTAL KNEE  2005  . VITRECTOMY Left 2006   Family History  Problem Relation Age of Onset  . Hypertension Mother   . Stroke Mother   . Ulcers Father 64       bleeding/ deceased  . Lymphoma Brother        cancer  . Breast cancer Sister   . Colon cancer Neg Hx   . Stomach cancer Neg Hx   . Rectal cancer Neg Hx   . Esophageal cancer Neg Hx   . Liver cancer Neg Hx     Social History   Tobacco Use  . Smoking status: Never Smoker  . Smokeless tobacco: Never Used  Substance Use Topics  . Alcohol use: Yes    Alcohol/week: 0.0 standard drinks    Comment: occasional   Marital Status: Single  ROS  Review of Systems  Constitutional: Positive for malaise/fatigue.  Cardiovascular: Positive for dyspnea on exertion and leg swelling. Negative for syncope.  Musculoskeletal: Positive for back pain.  Gastrointestinal: Negative for melena.  Neurological: Positive for tremors.   Objective  Blood pressure 128/76, pulse 83, temperature 97.9 F (36.6 C), temperature source Temporal,  resp. rate 16, height 5\' 7"  (1.702 m), weight 203 lb 9.6 oz (92.4 kg), SpO2 98 %.  Vitals with BMI 05/30/2020 04/03/2020 03/20/2020  Height 5\' 7"  5\' 7"  5\' 7"   Weight 203 lbs 10 oz 202 lbs 203 lbs 6 oz  BMI 31.88 31.63 31.85  Systolic 128 118 03/22/2020  Diastolic 76 64 82  Pulse 83 80 71     Physical Exam Constitutional:      General: He is not in acute distress.    Appearance: He is well-developed.     Comments: Mildly oebese  Cardiovascular:     Rate and Rhythm: Tachycardia present. Rhythm irregular.     Pulses: Intact distal pulses.     Heart sounds: No murmur heard. No gallop.      Comments: 2+ bilateral pitting leg edema, no JVD.  Pulmonary:     Effort: Pulmonary effort is normal. No accessory muscle usage.     Breath sounds: Normal breath  sounds.  Abdominal:     General: Bowel sounds are normal.     Palpations: Abdomen is soft.    Laboratory examination:   External labs:    Lab 05/22/2020:  Hb 11.6/HCT 36.7, platelets not reported.  Normal indicis. Cholesterol, total 159.000 m 11/03/2019 HDL 53 MG/DL LDL 408 mg 11/03/2019 Triglycerides 106.000 11/03/2019  Hemoglobin 12.400 g/d 11/03/2019  Creatinine, Serum 1.100 mg/ 11/03/2019 ALT (SGPT) 12.000 uni 11/03/2019 TSH 2.370 11/03/2019  Na/K 139/4.7  07/14/2019  Cholesterol, total 178.000 07/14/2019 Triglycerides 251.000 07/14/2019 HDL 39.000 07/14/2019 LDL-C 96.000 07/14/2019  BUN 22.000 07/14/2019 Creatinine, Serum 1.210 07/14/2019  A1C 5.700 07/14/2019  Glucose Random 117.000 07/14/2019  MicroAlbumin Urine 23.950 09/29/2018 MicroAlbumin/Creat 208.300 09/29/2018  PSA 7.850 06/17/2016  Medications and allergies  No Known Allergies   Current Outpatient Medications  Medication Instructions  . alfuzosin (UROXATRAL) 10 mg, Oral, Daily  . aspirin 81 mg, Oral, Daily  . carbidopa-levodopa (SINEMET IR) 25-100 MG tablet 1 tablet, Oral, 3 times daily  . COD LIVER OIL PO Oral, Daily, One teaspoon   . fexofenadine (ALLEGRA) 180 mg, Oral, Daily  . finasteride (PROSCAR) 5 MG tablet TAKE 1 TABLET DAILY AS DIRECTED.  . fluocinonide (LIDEX) 0.05 % external solution 1 application, Topical, 2 times daily, Instil 2 gtts in ear twice weekly   . folic acid (FOLVITE) 400 mcg, Oral, Daily  . furosemide (LASIX) 20 mg, Oral, Daily  . Garlic 1,000 mg, Oral, 2 times daily  . glucosamine-chondroitin 500-400 MG tablet 1 tablet, Oral, 2 times daily  . hyoscyamine (LEVSIN SL) 0.125 mg, Sublingual, As needed  . KLOR-CON M10 10 MEQ tablet 10 mEq, Oral, Daily  . modafinil (PROVIGIL) 150 mg, Oral, Daily  . Multiple Vitamin (MULTIVITAMIN) tablet 1 tablet, Oral, Daily, Shaklee vitamin w/o iron   . Multiple Vitamins-Minerals (OCUVITE PRESERVISION PO) 1 tablet, Oral, 2 times daily  . NON  FORMULARY Juice Plus Orchard Blend  2 daily and Garden Blend 2 daily   . pravastatin (PRAVACHOL) 40 mg, Oral, Daily at bedtime  . Probiotic Product (ALIGN PO) Oral  . silodosin (RAPAFLO) 8 mg, Oral, Daily with breakfast  . Travoprost, BAK Free, (TRAVATAN) 0.004 % SOLN ophthalmic solution 1 drop, Both Eyes, Daily at bedtime  . vitamin B-12 (CYANOCOBALAMIN) 1,000 mcg, Oral, Daily   Radiology:   Chest x-ray PA and lateral view 05/28/2020:  Heart size is normal, persistent bilateral pleural effusion at the lung bases compared to 05/22/2020 no change. Mild thoracic spondylosis.  Hyperinflation. Impression: Pneumonia versus minimal congestive  heart failure, hyperinflation consistent with COPD.  Cardiac Studies:   Sleep study 03/31/2014 Positive for Sleep Apnea, follows Dr. Casilda Carls myoview 06/16/2014: SPECT images demonstrate a moderate-sized mild ischemia extending from the base towards the apex in the septal wall. Left ventricle systolic function was Calculated at 50%. There was mild septal hypokinesis. This represents an intermediate risk scan.  Event Monitor 30 days 05/10/2018:  Predominant rhythm is sinus rhythm with first-degree AV block. No symptoms reported. One 4 beat VT at 8:46PM, Occasional PVCs.  Lexiscan myoview stress test 06/21/2018:  1. Lexiscan stress test was performed. Exercise capacity was not assessed. No stress symptoms reported. Resting blood pressure was 136/72 mmHg and peak effect blood pressure was 140/70 mmHg. The resting and stress electrocardiogram demonstrated normal sinus rhythm, RBBB + LAHB, occasional PVC and normal rest repolarization.  Stress EKG is non diagnostic for ischemia as it is a pharmacologic stress.  2. The overall quality of the study is good. There is no evidence of abnormal lung activity. Stress and rest SPECT images demonstrate homogeneous tracer distribution throughout the myocardium. Gated SPECT imaging reveals mild global decrease in  myocardial thickening and wall motion. The left ventricular ejection fraction was normal (46%).   3. High risk study due to reduced LVEF. No evidence of ischemia/ infarction.  Echocardiogram 07/12/2018:  Left ventricle cavity is normal in size. Moderate concentric hypertrophy of the left ventricle. Low normal LV systolic function. Abnormal septal  wall motion due to left bundle branch block. Doppler evidence of grade I (impaired) diastolic dysfunction, normal LAP. Calculated EF 51%.  Left atrial cavity is mildly dilated.  Right atrial cavity is mildly dilated.  Right ventricle cavity is mildly dilated. Normal right ventricular function.  Mild aortic valve leaflet calcification. No evidence of aortic valve stenosis. Trace aortic regurgitation.  Mild (Grade I) mitral regurgitation. Mild calcification of the mitral valve annulus.  Trace tricuspid regurgitation. Unable to estimate PA pressure due to absence/minimal TR signal.  No significant change since 06/15/2014.  EKG   EKG 05/31/2019: Atypical atrial flutter with variable AV conduction, ventricular rate 92 bpm.  Left axis deviation, left anterior fascicular block.  Right bundle branch block.  Poor R wave progression, cannot exclude anterolateral infarct old.  Nonspecific T abnormality.   EKG 05/28/2020: Atrial fibrillation with rapid ventricular response at rate of 110 bpm, left axis deviation, left anterior fascicular block.  Cannot exclude inferior infarct old.  Right bundle branch block.  Cannot exclude lateral infarct old.  Low-voltage complexes.  EKG 04/03/2020: Sinus rhythm with first-degree block at rate of 77 bpm, left axis deviation, left intrafascicular block.  Right bundle branch block.  Trifascicular block.  Poor R wave progression, cannot exclude anterolateral infarct old.  Unspecific T abnormality.   No significant change from EKG 09/19/2019,  03/17/2019.   Assessment     ICD-10-CM   1. Persistent atrial fibrillation (HCC)   I48.19 PCV ECHOCARDIOGRAM COMPLETE  2. Acute diastolic heart failure (HCC)  K74.25 Basic metabolic panel    Brain natriuretic peptide  3. Essential hypertension  I10 EKG 12-Lead  4. Trifascicular block  I45.3     CHA2DS2-VASc Score is 4.  Yearly risk of stroke: 4.8% (A, HTN, Coronary atherosclerosis).  Score of 1=0.6; 2=2.2; 3=3.2; 4=4.8; 5=7.2; 6=9.8; 7=>9.8) -(CHF; HTN; vasc disease DM,  Male = 1; Age <65 =0; 65-74 = 1,  >75 =2; stroke/embolism= 2).    No orders of the defined types were placed in this encounter.   There are  no discontinued medications.  Recommendations:   Nicholas Robbins is a fairly active 87 y.o. male who is very active and continues to live independently and works at [employer]. 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 Event monitor in January 2020 did not reveal any high degree AV block but had 4 beat NSVT, nuclear stress test on 08/20/2018 was nonischemic.   With past 3 weeks he has developed acute diastolic heart failure with worsening dyspnea, worsening leg edema and fatigue.  He was evaluated by his PCP, is now in A. fib with RVR.  I am unable to use negative chronotropic agents as patient has history of bradycardia and underlying trifascicular block and I am fearful of him developing complete heart block.  I started him on Eliquis 5 mg p.o. twice daily, advised him to continue with diuretics and potassium supplements as prescribed by his PCP, I will obtain a BNP and a BMP today and again repeat this in 2 weeks.  I will also obtain an echocardiogram to reevaluate his LV systolic function.  He has nonischemic stress test about 1.5 years ago.  He will need direct-current cardioversion 3 weeks after anticoagulation.  Will enroll the patient in Remote Patient Monitoring and Principal Care Management as  patient is high risk for hospitalization and complications from underlying medical conditions.   This was a 40-minute office visit encounter in review of external records, external EKG, personal interpretation and also discussions with the patient regarding anticoagulation and therapy for atrial fibrillation and management of acute diastolic heart failure. Yates Decamp, MD, [facility] 05/30/2020, 11:08 AM Office: [phone] Pager: [phone]

## 2020-05-31 LAB — BASIC METABOLIC PANEL
BUN/Creatinine Ratio: 16 (ref 10–24)
BUN: 16 mg/dL (ref 8–27)
CO2: 23 mmol/L (ref 20–29)
Calcium: 8.5 mg/dL — ABNORMAL LOW (ref 8.6–10.2)
Chloride: 100 mmol/L (ref 96–106)
Creatinine, Ser: 0.99 mg/dL (ref 0.76–1.27)
GFR calc Af Amer: 78 mL/min/{1.73_m2} (ref >59)
GFR calc non Af Amer: 67 mL/min/{1.73_m2} (ref >59)
Glucose: 100 mg/dL — ABNORMAL HIGH (ref 65–99)
Potassium: 4.1 mmol/L (ref 3.5–5.2)
Sodium: 138 mmol/L (ref 134–144)

## 2020-05-31 LAB — BRAIN NATRIURETIC PEPTIDE: BNP: 204.3 pg/mL — ABNORMAL HIGH (ref 0.0–100.0)

## 2020-06-07 ENCOUNTER — Other Ambulatory Visit: Payer: Self-pay

## 2020-06-07 ENCOUNTER — Ambulatory Visit: Payer: Medicare Other

## 2020-06-07 DIAGNOSIS — I4819 Other persistent atrial fibrillation: Secondary | ICD-10-CM

## 2020-06-14 NOTE — Progress Notes (Signed)
Primary Physician/Referring:  Rodrigo Ran, MD  Patient ID: Nicholas Robbins, male    DOB: [birth year], 87 y.o.   MRN: [id number]  Chief Complaint  Patient presents with   Atrial Fibrillation   Follow-up    2 weeks   HPI:    Nicholas Robbins  is a 87 y.o. male who is very active and continues to live independently and works at [employer]. 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 Event monitor in January 2020 did not reveal any high degree AV block but had 4 beat NSVT, nuclear stress test on 08/20/2018 was nonischemic.   Patient was seen by me on 05/30/2020 with 3 weeks history of fatigue and shortness of breath and acute diastolic heart failure.  He was in persistent atrial fibrillation after he was evaluated by his PCP and sent over to our office on a stat basis.  I started him on Eliquis and furosemide which he is tolerating.  Leg edema has improved significantly and dyspnea is also improved but still has fatigue and is not completely back to his baseline yet.  He is tolerating all his medications well.  No dizziness or syncope.   Past Medical History:  Diagnosis Date   Arthritis    Asthma    Benign essential tremor    BPH (benign prostatic hyperplasia)    Central serous retinopathy    COAG (chronic open-angle glaucoma)    Colon polyps    Diverticulitis    Essential hypertension 06/22/2018   Glaucoma    Hypercholesterolemia 06/22/2018   Hyperlipidemia    Hypertension    Macular degeneration    Macular hole    NSVT (nonsustained ventricular tachycardia) (HCC) 06/22/2018   Event Monitor 30 days 05/10/2018: Performed for syncope Predominant rhythm is sinus rhythm with first-degree AV block. No symptoms reported. One 4 beat VT at 8:46PM, Occasional PVCs.   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   Past Surgical History:  Procedure Laterality Date   BACK SURGERY     lumbar fusion   broken ankle  1961   CARPAL TUNNEL RELEASE Right 10/15/2017   Procedure: RIGHT CARPAL TUNNEL RELEASE;  Surgeon: Cindee Salt, MD;  Location: [facility];  Service: Orthopedics;  Laterality: Right;   CATARACT EXTRACTION, BILATERAL     HERNIA REPAIR     REPLACEMENT TOTAL KNEE  2005   VITRECTOMY Left 2006   Family History  Problem Relation Age of Onset   Hypertension Mother    Stroke Mother    Ulcers Father 91       bleeding/ deceased   Lymphoma Brother        cancer   Breast cancer Sister    Colon cancer Neg Hx    Stomach cancer Neg Hx    Rectal cancer Neg Hx    Esophageal cancer Neg Hx    Liver cancer Neg Hx     Social History   Tobacco Use   Smoking status: Never Smoker   Smokeless tobacco: Never Used  Substance Use Topics   Alcohol use: Yes    Alcohol/week: 0.0 standard drinks    Comment: occasional   Marital Status: Single  ROS  Review of Systems  Constitutional: Positive for malaise/fatigue.  Cardiovascular: Positive for dyspnea on exertion and leg swelling. Negative for syncope.  Musculoskeletal: Positive for back pain.  Gastrointestinal: Negative for melena.  Neurological: Positive for tremors.   Objective  Blood pressure 119/71, pulse 90, temperature 98.3 F (36.8 C), temperature source Temporal, resp. rate 17, height 5\' 7"  (1.702 m), weight 196 lb (88.9 kg), SpO2 98 %.  Vitals with BMI 06/15/2020 05/30/2020 04/03/2020  Height 5\' 7"  5\' 7"  5\' 7"   Weight 196 lbs 203 lbs 10 oz 202 lbs  BMI 30.69 31.88 31.63  Systolic 119 128 [id number]  Diastolic 71 76 64  Pulse 90 83 80     Physical Exam Constitutional:      General: He is not in acute distress.    Appearance: He is well-developed.     Comments: Mildly oebese  Cardiovascular:     Rate and  Rhythm: Normal rate. Rhythm irregular.     Pulses: Intact distal pulses.     Heart sounds: No murmur heard. No gallop.      Comments: 1-2+ bilateral pitting leg edema, no JVD.  Pulmonary:     Effort: Pulmonary effort is normal. No accessory muscle usage.     Breath sounds: Normal breath sounds.  Abdominal:     General: Bowel sounds are normal.     Palpations: Abdomen is soft.    Laboratory examination:   External labs:    Lab 05/22/2020:  Hb 11.6/HCT 36.7, platelets not reported.  Normal indicis. Cholesterol, total 159.000 m 11/03/2019 HDL 53 MG/DL 0/9/60457/11/2019 LDL 40.98185.000 mg 11/03/2019 Triglycerides 106.000 11/03/2019  Hemoglobin 12.400 g/d 11/03/2019  Creatinine, Serum 1.100 mg/ 11/03/2019 ALT (SGPT) 12.000 uni 11/03/2019 TSH 2.370 11/03/2019  Na/K 139/4.7  07/14/2019  Cholesterol, total 178.000 07/14/2019 Triglycerides 251.000 07/14/2019 HDL 39.000 07/14/2019 LDL-C 96.000 07/14/2019  BUN 22.000 07/14/2019 Creatinine, Serum 1.210 07/14/2019  A1C 5.700 07/14/2019  Glucose Random 117.000 07/14/2019  MicroAlbumin Urine 23.950 09/29/2018 MicroAlbumin/Creat 208.300 09/29/2018  PSA 7.850 06/17/2016  Medications and allergies  No Known Allergies   Current Outpatient Medications on File Prior to Visit  Medication Sig Dispense Refill   acetaminophen (TYLENOL) 500 MG tablet Take 1,000 mg by mouth daily as needed (pain).     alfuzosin (UROXATRAL) 10 MG 24 hr tablet Take 10 mg by mouth daily.     apixaban (ELIQUIS) 5 MG TABS tablet Take 5 mg by mouth 2 (two) times daily.     aspirin EC 81 MG tablet Take 81 mg by mouth daily. Swallow whole.     Bioflavonoid Products (VITAMIN C) CHEW Chew 1 tablet by mouth daily. Gummy     carbidopa-levodopa (SINEMET IR) 25-100 MG tablet Take 1 tablet by mouth 3 (three) times daily. 270 tablet 1   COD LIVER OIL PO Take 5 mLs by mouth daily.     ELDERBERRY PO Take 100 mg by mouth daily.     fexofenadine (ALLEGRA) 180 MG tablet Take 180 mg by mouth every  evening.     finasteride (PROSCAR) 5 MG tablet Take 5 mg by mouth daily.  3   fluocinonide (LIDEX) 0.05 % external solution Apply 1 application topically See admin instructions. Instil 2 gtts in ear twice weekly     folic acid (FOLVITE) 800 MCG tablet Take 800 mcg by mouth every evening.     furosemide (LASIX) 20 MG tablet Take 20 mg by mouth daily.     Garlic 1000 MG CAPS Take 1,000 mg by mouth 2 (  two) times a day.      Glucosamine-Chondroitin (COSAMIN DS PO) Take 1 tablet by mouth in the morning and at bedtime.     hydrochlorothiazide (MICROZIDE) 12.5 MG capsule Take 1 capsule by mouth daily.     hyoscyamine (LEVSIN SL) 0.125 MG SL tablet Place 0.125 mg under the tongue as needed for cramping.      KLOR-CON M10 10 MEQ tablet Take 10 mEq by mouth daily.     modafinil (PROVIGIL) 100 MG tablet Take 1.5 tablets (150 mg total) by mouth daily. (Patient taking differently: Take 150 mg by mouth daily as needed (alertness with driving).) 45 tablet 3   Multiple Vitamins-Minerals (PRESERVISION AREDS 2 PO) Take 1 tablet by mouth in the morning and at bedtime.     Nutritional Supplements (JUICE PLUS FIBRE PO) Take 2 tablets by mouth in the morning and at bedtime. Take 2 tablets Fruit Blend+ in the morning & take 2 tablets Vegetable Blend+ in the evening     pravastatin (PRAVACHOL) 40 MG tablet Take 40 mg by mouth at bedtime.  2   Probiotic Product (ALIGN PO) Take 1 capsule by mouth daily.     silodosin (RAPAFLO) 8 MG CAPS capsule Take 8 mg by mouth daily with breakfast.     Travoprost, BAK Free, (TRAVATAN) 0.004 % SOLN ophthalmic solution Place 1 drop into both eyes at bedtime.     vitamin B-12 (CYANOCOBALAMIN) 1000 MCG tablet Take 1,000 mcg by mouth daily.     No current facility-administered medications on file prior to visit.    Radiology:   Chest x-ray PA and lateral view 05/28/2020:  Heart size is normal, persistent bilateral pleural effusion at the lung bases compared to 05/22/2020  no change. Mild thoracic spondylosis.  Hyperinflation. Impression: Pneumonia versus minimal congestive heart failure, hyperinflation consistent with COPD.  Cardiac Studies:   Sleep study 03/31/2014 Positive for Sleep Apnea, follows Dr. Casilda Carls myoview 06/16/2014: SPECT images demonstrate a moderate-sized mild ischemia extending from the base towards the apex in the septal wall. Left ventricle systolic function was Calculated at 50%. There was mild septal hypokinesis. This represents an intermediate risk scan.  Event Monitor 30 days 05/10/2018:  Predominant rhythm is sinus rhythm with first-degree AV block. No symptoms reported. One 4 beat VT at 8:46PM, Occasional PVCs.  Lexiscan myoview stress test 06/21/2018:  1. Lexiscan stress test was performed. Exercise capacity was not assessed. No stress symptoms reported. Resting blood pressure was 136/72 mmHg and peak effect blood pressure was 140/70 mmHg. The resting and stress electrocardiogram demonstrated normal sinus rhythm, RBBB + LAHB, occasional PVC and normal rest repolarization.  Stress EKG is non diagnostic for ischemia as it is a pharmacologic stress.  2. The overall quality of the study is good. There is no evidence of abnormal lung activity. Stress and rest SPECT images demonstrate homogeneous tracer distribution throughout the myocardium. Gated SPECT imaging reveals mild global decrease in myocardial thickening and wall motion. The left ventricular ejection fraction was normal (46%).   3. High risk study due to reduced LVEF. No evidence of ischemia/ infarction.  Echocardiogram 06/07/2020:   1. Left ventricle cavity is normal in size and wall thickness. Abnormal septal wall motion due to left bundle branch block. Normal LV systolic function with EF 53%. Unable to evaluate diastolic function due to atrial fibrillation. Calculated EF 53%. 2. Left atrial cavity is mildly dilated. 3. Right atrial cavity is moderately dilated. 4.  Mild to moderate mitral regurgitation. 5. Moderate tricuspid  regurgitation. Estimated pulmonary artery systolic pressure 38 mmHg. 6. Insignificant pericardial effusion. 7. Compared to previous study in 2020, mild PH, pericardial effusion are new. Tricuspid regurgitation has increased from trace to moderate.  EKG   EKG 06/15/2020: Atrial fibrillation with controlled ventricular response at the rate of 94 bpm, left axis deviation, left anterior fascicular block.  Right bundle branch block.  Poor R wave progression, cannot exclude anterolateral infarct old.  Normal QT interval.  Compared to 05/31/2019, atypical atrial flutter.  Otherwise no significant change.   EKG 05/28/2020: Atrial fibrillation with rapid ventricular response at rate of 110 bpm, left axis deviation, left anterior fascicular block.  Cannot exclude inferior infarct old.  Right bundle branch block.  Cannot exclude lateral infarct old.  Low-voltage complexes.  EKG 04/03/2020: Sinus rhythm with first-degree block at rate of 77 bpm, left axis deviation, left intrafascicular block.  Right bundle branch block.  Trifascicular block.  Poor R wave progression, cannot exclude anterolateral infarct old.  Unspecific T abnormality.   No significant change from EKG 09/19/2019,  03/17/2019.   Assessment     ICD-10-CM   1. Persistent atrial fibrillation (HCC)  I48.19 EKG 12-Lead    Basic metabolic panel  2. Acute diastolic heart failure (HCC)  J94.17 Brain natriuretic peptide  3. Trifascicular block  I45.3     CHA2DS2-VASc Score is 4.  Yearly risk of stroke: 4.8% (A, HTN, Coronary atherosclerosis).  Score of 1=0.6; 2=2.2; 3=3.2; 4=4.8; 5=7.2; 6=9.8; 7=>9.8) -(CHF; HTN; vasc disease DM,  Male = 1; Age <65 =0; 65-74 = 1,  >75 =2; stroke/embolism= 2).    No orders of the defined types were placed in this encounter.   Medications Discontinued During This Encounter  Medication Reason   Multiple Vitamin (MULTIVITAMIN WITH MINERALS) TABS tablet  Patient Preference    Recommendations:   Nicholas Robbins is a fairly active 87 y.o. male who is very active and continues to live independently and works at [employer]. 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 Event monitor in January 2020 did not reveal any high degree AV block but had 4 beat NSVT, nuclear stress test on 08/20/2018 was nonischemic.   Patient was seen by me on a stat basis after he was found to be in atrial fibrillation by his PCP on 05/30/2020 with 3 weeks history of fatigue and shortness of breath and acute diastolic heart failure.  He is tolerating furosemide with significant improvement in leg edema.  Heart rate is much improved and his dyspnea is also improved.  Still has persistent atrial fibrillation, will proceed with direct-current cardioversion which has already been scheduled for 06/19/2020 and he will completed at least 3 to 4 weeks of anticoagulation with Eliquis.  Today we will obtain repeat BMP as he is on Lasix and BNP.   Yates Decamp, MD, [facility] 06/15/2020, 11:06 AM Office: [phone] Pager: [phone]

## 2020-06-14 NOTE — H&P (View-Only) (Signed)
Primary Physician/Referring:  Rodrigo Ran, MD  Patient ID: Nicholas Robbins, male    DOB: May 25, 1928, 87 y.o.   MRN: [id number]  Chief Complaint  Patient presents with  . Atrial Fibrillation  . Follow-up    2 weeks   HPI:    Nicholas Robbins  is a 87 y.o. male who is very active and continues to live independently and works at [employer]. 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 Event monitor in January 2020 did not reveal any high degree AV block but had 4 beat NSVT, nuclear stress test on 08/20/2018 was nonischemic.   Patient was seen by me on 05/30/2020 with 3 weeks history of fatigue and shortness of breath and acute diastolic heart failure.  He was in persistent atrial fibrillation after he was evaluated by his PCP and sent over to our office on a stat basis.  I started him on Eliquis and furosemide which he is tolerating.  Leg edema has improved significantly and dyspnea is also improved but still has fatigue and is not completely back to his baseline yet.  He is tolerating all his medications well.  No dizziness or syncope.   Past Medical History:  Diagnosis Date  . Arthritis   . Asthma   . Benign essential tremor   . BPH (benign prostatic hyperplasia)   . Central serous retinopathy   . COAG (chronic open-angle glaucoma)   . Colon polyps   . Diverticulitis   . Essential hypertension 06/22/2018  . Glaucoma   . Hypercholesterolemia 06/22/2018  . Hyperlipidemia   . Hypertension   . Macular degeneration   . Macular hole   . NSVT (nonsustained ventricular tachycardia) (HCC) 06/22/2018   Event Monitor 30 days 05/10/2018: Performed for syncope Predominant rhythm is sinus rhythm with first-degree AV block. No symptoms reported. One 4 beat VT at 8:46PM, Occasional PVCs.  . OSA on CPAP    use CPAP nightly  . Pneumonia 05/21/2020   . Pseudophakia of both eyes   . Syncope and collapse 05/07/2018  . 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 BACK SURGERY     lumbar fusion  . broken ankle  1961  . CARPAL TUNNEL RELEASE Right 10/15/2017   Procedure: RIGHT CARPAL TUNNEL RELEASE;  Surgeon: Cindee Salt, MD;  Location: [facility];  Service: Orthopedics;  Laterality: Right;  . CATARACT EXTRACTION, BILATERAL    . HERNIA REPAIR    . REPLACEMENT TOTAL KNEE  2005  . VITRECTOMY Left 2006   Family History  Problem Relation Age of Onset  . Hypertension Mother   . Stroke Mother   . Ulcers Father 49       bleeding/ deceased  . Lymphoma Brother        cancer  . Breast cancer Sister   . Colon cancer Neg Hx   . Stomach cancer Neg Hx   . Rectal cancer Neg Hx   . Esophageal cancer Neg Hx   . Liver cancer Neg Hx     Social History   Tobacco Use  . Smoking status: Never Smoker  . Smokeless tobacco: Never Used  Substance Use Topics  . Alcohol use: Yes    Alcohol/week: 0.0 standard drinks    Comment: occasional   Marital Status: Single  ROS  Review of Systems  Constitutional: Positive for malaise/fatigue.  Cardiovascular: Positive for dyspnea on exertion and leg swelling. Negative for syncope.  Musculoskeletal: Positive for back pain.  Gastrointestinal: Negative for melena.  Neurological: Positive for tremors.   Objective  Blood pressure 119/71, pulse 90, temperature 98.3 F (36.8 C), temperature source Temporal, resp. rate 17, height 5\' 7"  (1.702 m), weight 196 lb (88.9 kg), SpO2 98 %.  Vitals with BMI 06/15/2020 05/30/2020 04/03/2020  Height 5\' 7"  5\' 7"  5\' 7"   Weight 196 lbs 203 lbs 10 oz 202 lbs  BMI 30.69 31.88 31.63  Systolic 119 128 14/10/2019  Diastolic 71 76 64  Pulse 90 83 80     Physical Exam Constitutional:      General: He is not in acute distress.    Appearance: He is well-developed.     Comments: Mildly oebese  Cardiovascular:     Rate and  Rhythm: Normal rate. Rhythm irregular.     Pulses: Intact distal pulses.     Heart sounds: No murmur heard. No gallop.      Comments: 1-2+ bilateral pitting leg edema, no JVD.  Pulmonary:     Effort: Pulmonary effort is normal. No accessory muscle usage.     Breath sounds: Normal breath sounds.  Abdominal:     General: Bowel sounds are normal.     Palpations: Abdomen is soft.    Laboratory examination:   External labs:    Lab 05/22/2020:  Hb 11.6/HCT 36.7, platelets not reported.  Normal indicis. Cholesterol, total 159.000 m 11/03/2019 HDL 53 MG/DL LDL 102 mg 11/03/2019 Triglycerides 106.000 11/03/2019  Hemoglobin 12.400 g/d 11/03/2019  Creatinine, Serum 1.100 mg/ 11/03/2019 ALT (SGPT) 12.000 uni 11/03/2019 TSH 2.370 11/03/2019  Na/K 139/4.7  07/14/2019  Cholesterol, total 178.000 07/14/2019 Triglycerides 251.000 07/14/2019 HDL 39.000 07/14/2019 LDL-C 96.000 07/14/2019  BUN 22.000 07/14/2019 Creatinine, Serum 1.210 07/14/2019  A1C 5.700 07/14/2019  Glucose Random 117.000 07/14/2019  MicroAlbumin Urine 23.950 09/29/2018 MicroAlbumin/Creat 208.300 09/29/2018  PSA 7.850 06/17/2016  Medications and allergies  No Known Allergies   Current Outpatient Medications on File Prior to Visit  Medication Sig Dispense Refill  . acetaminophen (TYLENOL) 500 MG tablet Take 1,000 mg by mouth daily as needed (pain).    07/16/2019 alfuzosin (UROXATRAL) 10 MG 24 hr tablet Take 10 mg by mouth daily.    11/29/2018 apixaban (ELIQUIS) 5 MG TABS tablet Take 5 mg by mouth 2 (two) times daily.    11/29/2018 aspirin EC 81 MG tablet Take 81 mg by mouth daily. Swallow whole.    06/19/2016 Bioflavonoid Products (VITAMIN C) CHEW Chew 1 tablet by mouth daily. Gummy    . carbidopa-levodopa (SINEMET IR) 25-100 MG tablet Take 1 tablet by mouth 3 (three) times daily. 270 tablet 1  . COD LIVER OIL PO Take 5 mLs by mouth daily.    Marland Kitchen ELDERBERRY PO Take 100 mg by mouth daily.    . fexofenadine (ALLEGRA) 180 MG tablet Take 180 mg by mouth every  evening.    . finasteride (PROSCAR) 5 MG tablet Take 5 mg by mouth daily.  3  . fluocinonide (LIDEX) 0.05 % external solution Apply 1 application topically See admin instructions. Instil 2 gtts in ear twice weekly    . folic acid (FOLVITE) 800 MCG tablet Take 800 mcg by mouth every evening.    . furosemide (LASIX) 20 MG tablet Take 20 mg by mouth daily.    . Garlic 1000 MG CAPS Take 1,000 mg by mouth 2 (  two) times a day.     . Glucosamine-Chondroitin (COSAMIN DS PO) Take 1 tablet by mouth in the morning and at bedtime.    . hydrochlorothiazide (MICROZIDE) 12.5 MG capsule Take 1 capsule by mouth daily.    . hyoscyamine (LEVSIN SL) 0.125 MG SL tablet Place 0.125 mg under the tongue as needed for cramping.     Marland Kitchen. KLOR-CON M10 10 MEQ tablet Take 10 mEq by mouth daily.    . modafinil (PROVIGIL) 100 MG tablet Take 1.5 tablets (150 mg total) by mouth daily. (Patient taking differently: Take 150 mg by mouth daily as needed (alertness with driving).) 45 tablet 3  . Multiple Vitamins-Minerals (PRESERVISION AREDS 2 PO) Take 1 tablet by mouth in the morning and at bedtime.    . Nutritional Supplements (JUICE PLUS FIBRE PO) Take 2 tablets by mouth in the morning and at bedtime. Take 2 tablets Fruit Blend+ in the morning & take 2 tablets Vegetable Blend+ in the evening    . pravastatin (PRAVACHOL) 40 MG tablet Take 40 mg by mouth at bedtime.  2  . Probiotic Product (ALIGN PO) Take 1 capsule by mouth daily.    . silodosin (RAPAFLO) 8 MG CAPS capsule Take 8 mg by mouth daily with breakfast.    . Travoprost, BAK Free, (TRAVATAN) 0.004 % SOLN ophthalmic solution Place 1 drop into both eyes at bedtime.    . vitamin B-12 (CYANOCOBALAMIN) 1000 MCG tablet Take 1,000 mcg by mouth daily.     No current facility-administered medications on file prior to visit.    Radiology:   Chest x-ray PA and lateral view 05/28/2020:  Heart size is normal, persistent bilateral pleural effusion at the lung bases compared to 05/22/2020  no change. Mild thoracic spondylosis.  Hyperinflation. Impression: Pneumonia versus minimal congestive heart failure, hyperinflation consistent with COPD.  Cardiac Studies:   Sleep study 03/31/2014 Positive for Sleep Apnea, follows Dr. Casilda Carlsohmeier  Lexiscan myoview 06/16/2014: SPECT images demonstrate a moderate-sized mild ischemia extending from the base towards the apex in the septal wall. Left ventricle systolic function was Calculated at 50%. There was mild septal hypokinesis. This represents an intermediate risk scan.  Event Monitor 30 days 05/10/2018:  Predominant rhythm is sinus rhythm with first-degree AV block. No symptoms reported. One 4 beat VT at 8:46PM, Occasional PVCs.  Lexiscan myoview stress test 06/21/2018:  1. Lexiscan stress test was performed. Exercise capacity was not assessed. No stress symptoms reported. Resting blood pressure was 136/72 mmHg and peak effect blood pressure was 140/70 mmHg. The resting and stress electrocardiogram demonstrated normal sinus rhythm, RBBB + LAHB, occasional PVC and normal rest repolarization.  Stress EKG is non diagnostic for ischemia as it is a pharmacologic stress.  2. The overall quality of the study is good. There is no evidence of abnormal lung activity. Stress and rest SPECT images demonstrate homogeneous tracer distribution throughout the myocardium. Gated SPECT imaging reveals mild global decrease in myocardial thickening and wall motion. The left ventricular ejection fraction was normal (46%).   3. High risk study due to reduced LVEF. No evidence of ischemia/ infarction.  Echocardiogram 06/07/2020:   1. Left ventricle cavity is normal in size and wall thickness. Abnormal septal wall motion due to left bundle branch block. Normal LV systolic function with EF 53%. Unable to evaluate diastolic function due to atrial fibrillation. Calculated EF 53%. 2. Left atrial cavity is mildly dilated. 3. Right atrial cavity is moderately dilated. 4.  Mild to moderate mitral regurgitation. 5. Moderate tricuspid  regurgitation. Estimated pulmonary artery systolic pressure 38 mmHg. 6. Insignificant pericardial effusion. 7. Compared to previous study in 2020, mild PH, pericardial effusion are new. Tricuspid regurgitation has increased from trace to moderate.  EKG   EKG 06/15/2020: Atrial fibrillation with controlled ventricular response at the rate of 94 bpm, left axis deviation, left anterior fascicular block.  Right bundle branch block.  Poor R wave progression, cannot exclude anterolateral infarct old.  Normal QT interval.  Compared to 05/31/2019, atypical atrial flutter.  Otherwise no significant change.   EKG 05/28/2020: Atrial fibrillation with rapid ventricular response at rate of 110 bpm, left axis deviation, left anterior fascicular block.  Cannot exclude inferior infarct old.  Right bundle branch block.  Cannot exclude lateral infarct old.  Low-voltage complexes.  EKG 04/03/2020: Sinus rhythm with first-degree block at rate of 77 bpm, left axis deviation, left intrafascicular block.  Right bundle branch block.  Trifascicular block.  Poor R wave progression, cannot exclude anterolateral infarct old.  Unspecific T abnormality.   No significant change from EKG 09/19/2019,  03/17/2019.   Assessment     ICD-10-CM   1. Persistent atrial fibrillation (HCC)  I48.19 EKG 12-Lead    Basic metabolic panel  2. Acute diastolic heart failure (HCC)  P54.65 Brain natriuretic peptide  3. Trifascicular block  I45.3     CHA2DS2-VASc Score is 4.  Yearly risk of stroke: 4.8% (A, HTN, Coronary atherosclerosis).  Score of 1=0.6; 2=2.2; 3=3.2; 4=4.8; 5=7.2; 6=9.8; 7=>9.8) -(CHF; HTN; vasc disease DM,  Male = 1; Age <65 =0; 65-74 = 1,  >75 =2; stroke/embolism= 2).    No orders of the defined types were placed in this encounter.   Medications Discontinued During This Encounter  Medication Reason  . Multiple Vitamin (MULTIVITAMIN WITH MINERALS) TABS tablet  Patient Preference    Recommendations:   Mr. Kupono Marling is a fairly active 87 y.o. male who is very active and continues to live independently and works at [employer]. 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 Event monitor in January 2020 did not reveal any high degree AV block but had 4 beat NSVT, nuclear stress test on 08/20/2018 was nonischemic.   Patient was seen by me on a stat basis after he was found to be in atrial fibrillation by his PCP on 05/30/2020 with 3 weeks history of fatigue and shortness of breath and acute diastolic heart failure.  He is tolerating furosemide with significant improvement in leg edema.  Heart rate is much improved and his dyspnea is also improved.  Still has persistent atrial fibrillation, will proceed with direct-current cardioversion which has already been scheduled for 06/19/2020 and he will completed at least 3 to 4 weeks of anticoagulation with Eliquis.  Today we will obtain repeat BMP as he is on Lasix and BNP.   Yates Decamp, MD, [facility] 06/15/2020, 11:06 AM Office: [phone] Pager: [phone]

## 2020-06-15 ENCOUNTER — Other Ambulatory Visit: Payer: Self-pay

## 2020-06-15 ENCOUNTER — Ambulatory Visit: Payer: Medicare Other | Admitting: Cardiology

## 2020-06-15 ENCOUNTER — Encounter: Payer: Self-pay | Admitting: Cardiology

## 2020-06-15 VITALS — BP 119/71 | HR 90 | Temp 98.3°F | Resp 17 | Ht 67.0 in | Wt 196.0 lb

## 2020-06-15 DIAGNOSIS — I4819 Other persistent atrial fibrillation: Secondary | ICD-10-CM

## 2020-06-15 DIAGNOSIS — I5031 Acute diastolic (congestive) heart failure: Secondary | ICD-10-CM

## 2020-06-15 DIAGNOSIS — I453 Trifascicular block: Secondary | ICD-10-CM

## 2020-06-16 LAB — BASIC METABOLIC PANEL
BUN/Creatinine Ratio: 14 (ref 10–24)
BUN: 14 mg/dL (ref 8–27)
CO2: 20 mmol/L (ref 20–29)
Calcium: 8.9 mg/dL (ref 8.6–10.2)
Chloride: 102 mmol/L (ref 96–106)
Creatinine, Ser: 1.01 mg/dL (ref 0.76–1.27)
GFR calc Af Amer: 76 mL/min/{1.73_m2} (ref >59)
GFR calc non Af Amer: 66 mL/min/{1.73_m2} (ref >59)
Glucose: 99 mg/dL (ref 65–99)
Potassium: 4.3 mmol/L (ref 3.5–5.2)
Sodium: 138 mmol/L (ref 134–144)

## 2020-06-16 LAB — BRAIN NATRIURETIC PEPTIDE: BNP: 230.9 pg/mL — ABNORMAL HIGH (ref 0.0–100.0)

## 2020-06-18 ENCOUNTER — Other Ambulatory Visit (HOSPITAL_COMMUNITY)
Admission: RE | Admit: 2020-06-18 | Discharge: 2020-06-18 | Disposition: A | Discharge status: Home / Self Care | Payer: Medicare Other | Source: Ambulatory Visit | Attending: Cardiology | Admitting: Cardiology

## 2020-06-18 DIAGNOSIS — Z20822 Contact with and (suspected) exposure to covid-19: Secondary | ICD-10-CM | POA: Insufficient documentation

## 2020-06-18 DIAGNOSIS — Z01812 Encounter for preprocedural laboratory examination: Secondary | ICD-10-CM | POA: Diagnosis present

## 2020-06-19 ENCOUNTER — Ambulatory Visit (HOSPITAL_COMMUNITY): Payer: Medicare Other | Admitting: Certified Registered Nurse Anesthetist

## 2020-06-19 ENCOUNTER — Encounter (HOSPITAL_COMMUNITY): Payer: Self-pay | Admitting: Cardiology

## 2020-06-19 ENCOUNTER — Ambulatory Visit (HOSPITAL_COMMUNITY)
Admission: RE | Admit: 2020-06-19 | Discharge: 2020-06-19 | Disposition: A | Discharge status: Home / Self Care | Payer: Medicare Other | Attending: Cardiology | Admitting: Cardiology

## 2020-06-19 ENCOUNTER — Encounter (HOSPITAL_COMMUNITY)
Admission: RE | Disposition: A | Discharge status: Home / Self Care | Payer: Self-pay | Source: Home / Self Care | Attending: Cardiology

## 2020-06-19 ENCOUNTER — Other Ambulatory Visit: Payer: Self-pay

## 2020-06-19 DIAGNOSIS — Z79899 Other long term (current) drug therapy: Secondary | ICD-10-CM | POA: Insufficient documentation

## 2020-06-19 DIAGNOSIS — I453 Trifascicular block: Secondary | ICD-10-CM | POA: Insufficient documentation

## 2020-06-19 DIAGNOSIS — I484 Atypical atrial flutter: Secondary | ICD-10-CM | POA: Diagnosis not present

## 2020-06-19 DIAGNOSIS — Z7982 Long term (current) use of aspirin: Secondary | ICD-10-CM | POA: Diagnosis not present

## 2020-06-19 DIAGNOSIS — I5031 Acute diastolic (congestive) heart failure: Secondary | ICD-10-CM | POA: Diagnosis not present

## 2020-06-19 DIAGNOSIS — I4819 Other persistent atrial fibrillation: Secondary | ICD-10-CM | POA: Insufficient documentation

## 2020-06-19 HISTORY — PX: CARDIOVERSION: SHX1299

## 2020-06-19 LAB — SARS CORONAVIRUS 2 (TAT 6-24 HRS): SARS Coronavirus 2: NEGATIVE

## 2020-06-19 SURGERY — CARDIOVERSION
Anesthesia: General

## 2020-06-19 MED ORDER — LIDOCAINE 2% (20 MG/ML) 5 ML SYRINGE
INTRAMUSCULAR | Status: DC | PRN
  Administered 2020-06-19: 40 mg via INTRAVENOUS

## 2020-06-19 MED ORDER — PROPOFOL 10 MG/ML IV BOLUS
INTRAVENOUS | Status: DC | PRN
  Administered 2020-06-19: 50 mg via INTRAVENOUS

## 2020-06-19 MED ORDER — SODIUM CHLORIDE 0.9 % IV SOLN: INTRAVENOUS | Status: DC

## 2020-06-19 NOTE — Transfer of Care (Signed)
Immediate Anesthesia Transfer of Care Note  Patient: Nicholas Robbins  Procedure(s) Performed: CARDIOVERSION (N/A )  Patient Location: Endoscopy Unit  Anesthesia Type:General  Level of Consciousness: drowsy  Airway & Oxygen Therapy: Patient Spontanous Breathing and Patient connected to nasal cannula oxygen  Post-op Assessment: Report given to RN and Post -op Vital signs reviewed and stable  Post vital signs: Reviewed  Last Vitals:  Vitals Value Taken Time  BP    Temp    Pulse    Resp    SpO2      Last Pain:  Vitals:   06/19/20 1157  PainSc: 0-No pain         Complications: No complications documented.

## 2020-06-19 NOTE — CV Procedure (Signed)
Direct current cardioversion:  Indication symptomatic A. Fibrillation/atypical flutter.  Procedure: Using 50 mg of IV Propofol and 60 IV Lidocaine (for reducing venous pain) for achieving deep sedation, synchronized direct current cardioversion performed. Patient was delivered with 120 Joules of electricity X 1 with success to NSR. Patient tolerated the procedure well. No immediate complication noted.    Yates Decamp, MD, [facility] 06/19/2020, 1:11 PM Office: [phone] Pager: [phone]

## 2020-06-19 NOTE — Discharge Instructions (Signed)
Electrical Cardioversion Electrical cardioversion is the delivery of a jolt of electricity to restore a normal rhythm to the heart. A rhythm that is too fast or is not regular keeps the heart from pumping well. In this procedure, sticky patches or metal paddles are placed on the chest to deliver electricity to the heart from a device. This procedure may be done in an emergency if:  There is low or no blood pressure as a result of the heart rhythm.  Normal rhythm must be restored as fast as possible to protect the brain and heart from further damage.  It may save a life. This may also be a scheduled procedure for irregular or fast heart rhythms that are not immediately life-threatening. Tell a health care provider about:  Any allergies you have.  All medicines you are taking, including vitamins, herbs, eye drops, creams, and over-the-counter medicines.  Any problems you or family members have had with anesthetic medicines.  Any blood disorders you have.  Any surgeries you have had.  Any medical conditions you have.  Whether you are pregnant or may be pregnant. What are the risks? Generally, this is a safe procedure. However, problems may occur, including:  Allergic reactions to medicines.  A blood clot that breaks free and travels to other parts of your body.  The possible return of an abnormal heart rhythm within hours or days after the procedure.  Your heart stopping (cardiac arrest). This is rare. What happens before the procedure? Medicines  Your health care provider may have you start taking: ? Blood-thinning medicines (anticoagulants) so your blood does not clot as easily. ? Medicines to help stabilize your heart rate and rhythm.  Ask your health care provider about: ? Changing or stopping your regular medicines. This is especially important if you are taking diabetes medicines or blood thinners. ? Taking medicines such as aspirin and ibuprofen. These medicines can  thin your blood. Do not take these medicines unless your health care provider tells you to take them. ? Taking over-the-counter medicines, vitamins, herbs, and supplements. General instructions  Follow instructions from your health care provider about eating or drinking restrictions.  Plan to have someone take you home from the hospital or clinic.  If you will be going home right after the procedure, plan to have someone with you for 24 hours.  Ask your health care provider what steps will be taken to help prevent infection. These may include washing your skin with a germ-killing soap. What happens during the procedure?  An IV will be inserted into one of your veins.  Sticky patches (electrodes) or metal paddles may be placed on your chest.  You will be given a medicine to help you relax (sedative).  An electrical shock will be delivered. The procedure may vary among health care providers and hospitals.   What can I expect after the procedure?  Your blood pressure, heart rate, breathing rate, and blood oxygen level will be monitored until you leave the hospital or clinic.  Your heart rhythm will be watched to make sure it does not change.  You may have some redness on the skin where the shocks were given. Follow these instructions at home:  Do not drive for 24 hours if you were given a sedative during your procedure.  Take over-the-counter and prescription medicines only as told by your health care provider.  Ask your health care provider how to check your pulse. Check it often.  Rest for 48 hours after the procedure   or as told by your health care provider.  Avoid or limit your caffeine use as told by your health care provider.  Keep all follow-up visits as told by your health care provider. This is important. Contact a health care provider if:  You feel like your heart is beating too quickly or your pulse is not regular.  You have a serious muscle cramp that does not go  away. Get help right away if:  You have discomfort in your chest.  You are dizzy or you feel faint.  You have trouble breathing or you are short of breath.  Your speech is slurred.  You have trouble moving an arm or leg on one side of your body.  Your fingers or toes turn cold or blue. Summary  Electrical cardioversion is the delivery of a jolt of electricity to restore a normal rhythm to the heart.  This procedure may be done right away in an emergency or may be a scheduled procedure if the condition is not an emergency.  Generally, this is a safe procedure.  After the procedure, check your pulse often as told by your health care provider. This information is not intended to replace advice given to you by your health care provider. Make sure you discuss any questions you have with your health care provider. Document Revised: 11/15/2018 Document Reviewed: 11/15/2018 Elsevier Patient Education  2021 Elsevier Inc.  

## 2020-06-19 NOTE — Anesthesia Preprocedure Evaluation (Addendum)
Anesthesia Evaluation  Patient identified by MRN, date of birth, ID band Patient awake    Reviewed: Allergy & Precautions, NPO status , Patient's Chart, lab work & pertinent test results  Airway Mallampati: II  TM Distance: >3 FB Neck ROM: Full    Dental  (+) Teeth Intact, Dental Advisory Given   Pulmonary asthma , sleep apnea and Continuous Positive Airway Pressure Ventilation ,    breath sounds clear to auscultation       Cardiovascular hypertension, Pt. on medications + CAD  + dysrhythmias  Rhythm:Irregular Rate:Abnormal     Neuro/Psych negative neurological ROS  negative psych ROS   GI/Hepatic negative GI ROS, Neg liver ROS,   Endo/Other  negative endocrine ROS  Renal/GU negative Renal ROS     Musculoskeletal  (+) Arthritis ,   Abdominal   Peds  Hematology negative hematology ROS (+)   Anesthesia Other Findings - HLD  Reproductive/Obstetrics                            Anesthesia Physical Anesthesia Plan  ASA: III  Anesthesia Plan: General   Post-op Pain Management:    Induction: Intravenous  PONV Risk Score and Plan: 0 and Propofol infusion  Airway Management Planned: Simple Face Mask and Natural Airway  Additional Equipment: None  Intra-op Plan:   Post-operative Plan:   Informed Consent: I have reviewed the patients History and Physical, chart, labs and discussed the procedure including the risks, benefits and alternatives for the proposed anesthesia with the patient or authorized representative who has indicated his/her understanding and acceptance.       Plan Discussed with: CRNA  Anesthesia Plan Comments:        Anesthesia Quick Evaluation

## 2020-06-19 NOTE — Anesthesia Postprocedure Evaluation (Signed)
Anesthesia Post Note  Patient: Nicholas Robbins  Procedure(s) Performed: CARDIOVERSION (N/A )     Patient location during evaluation: PACU Anesthesia Type: General Level of consciousness: awake and alert Pain management: pain level controlled Vital Signs Assessment: post-procedure vital signs reviewed and stable Respiratory status: spontaneous breathing, nonlabored ventilation, respiratory function stable and patient connected to nasal cannula oxygen Cardiovascular status: blood pressure returned to baseline and stable Postop Assessment: no apparent nausea or vomiting Anesthetic complications: no   No complications documented.  Last Vitals:  Vitals:   06/19/20 1320 06/19/20 1330  BP: 108/62 113/66  Pulse: 78 79  Resp: 18 (!) 25  SpO2: 100% 98%    Last Pain:  Vitals:   06/19/20 1330  PainSc: 0-No pain                 Shelton Silvas

## 2020-06-19 NOTE — Interval H&P Note (Signed)
History and Physical Interval Note:  06/19/2020 1:04 PM  Nicholas Robbins  has presented today for surgery, with the diagnosis of A-FIB.  The various methods of treatment have been discussed with the patient and family. After consideration of risks, benefits and other options for treatment, the patient has consented to  Procedure(s): CARDIOVERSION (N/A) as a surgical intervention.  The patient's history has been reviewed, patient examined, no change in status, stable for surgery.  I have reviewed the patient's chart and labs.  Questions were answered to the patient's satisfaction.     Yates Decamp

## 2020-06-19 NOTE — Anesthesia Procedure Notes (Signed)
Procedure Name: General with mask airway Date/Time: 06/19/2020 1:06 PM Performed by: Audie Pinto, CRNA Pre-anesthesia Checklist: Patient identified, Emergency Drugs available, Suction available and Patient being monitored Patient Re-evaluated:Patient Re-evaluated prior to induction Oxygen Delivery Method: Ambu bag Induction Type: IV induction Placement Confirmation: positive ETCO2 Dental Injury: Teeth and Oropharynx as per pre-operative assessment

## 2020-06-20 ENCOUNTER — Encounter (HOSPITAL_COMMUNITY): Payer: Self-pay | Admitting: Cardiology

## 2020-07-13 ENCOUNTER — Encounter: Payer: Self-pay | Admitting: Cardiology

## 2020-07-13 ENCOUNTER — Ambulatory Visit: Payer: Medicare Other | Admitting: Cardiology

## 2020-07-13 ENCOUNTER — Other Ambulatory Visit: Payer: Self-pay

## 2020-07-13 VITALS — BP 124/67 | HR 82 | Temp 98.0°F | Resp 16 | Ht 67.0 in | Wt 188.6 lb

## 2020-07-13 DIAGNOSIS — I5032 Chronic diastolic (congestive) heart failure: Secondary | ICD-10-CM

## 2020-07-13 DIAGNOSIS — I453 Trifascicular block: Secondary | ICD-10-CM

## 2020-07-13 DIAGNOSIS — I10 Essential (primary) hypertension: Secondary | ICD-10-CM

## 2020-07-13 DIAGNOSIS — I4819 Other persistent atrial fibrillation: Secondary | ICD-10-CM

## 2020-07-13 NOTE — Progress Notes (Signed)
Primary Physician/Referring:  Rodrigo Ran, MD  Patient ID: Nicholas Robbins, male    DOB: [birth year], 87 y.o.   MRN: [id number]  Chief Complaint  Patient presents with  . Atrial Fibrillation  . Follow-up    Post cardioversion   HPI:    Nicholas Robbins  is a 87 y.o. male who is very active and continues to live independently and works at [employer]. 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 Event monitor in January 2020 did not reveal any high degree AV block but had 4 beat NSVT, nuclear stress test on 08/20/2018 was nonischemic.   Patient was seen by me on 05/30/2020 with 3 weeks history of fatigue and shortness of breath and acute diastolic heart failure.  He was in persistent atrial fibrillation. He underwent direct-current cardioversion on 06/19/2020.  Since cardioversion, patient states that his dyspnea has improved, fatigue is improved and is back to his baseline.  He has no specific complaints today.  No further leg edema, PND or orthopnea.  He is tolerating all his medications well.  No dizziness or syncope.   Past Medical History:  Diagnosis Date  . Arthritis   . Asthma   . Benign essential tremor   . BPH (benign prostatic hyperplasia)   . Central serous retinopathy   . COAG (chronic open-angle glaucoma)   . Colon polyps   . Diverticulitis   . Essential hypertension 06/22/2018  . Glaucoma   . Hypercholesterolemia 06/22/2018  . Hyperlipidemia   . Hypertension   . Macular degeneration   . Macular hole   . NSVT (nonsustained ventricular tachycardia) (HCC) 06/22/2018   Event Monitor 30 days 05/10/2018: Performed for syncope Predominant rhythm is sinus rhythm with first-degree AV block. No symptoms reported. One 4 beat VT at 8:46PM, Occasional PVCs.  . OSA on CPAP    use CPAP nightly  . Pneumonia 05/21/2020  .  Pseudophakia of both eyes   . Syncope and collapse 05/07/2018  . 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 BACK SURGERY     lumbar fusion  . broken ankle  1961  . CARDIOVERSION N/A 06/19/2020   Procedure: CARDIOVERSION;  Surgeon: Yates Decamp, MD;  Location: [facility];  Service: Cardiovascular;  Laterality: N/A;  . CARPAL TUNNEL RELEASE Right 10/15/2017   Procedure: RIGHT CARPAL TUNNEL RELEASE;  Surgeon: Cindee Salt, MD;  Location: [facility];  Service: Orthopedics;  Laterality: Right;  . CATARACT EXTRACTION, BILATERAL    . HERNIA REPAIR    . REPLACEMENT TOTAL KNEE  2005  . VITRECTOMY Left 2006   Family History  Problem Relation Age of Onset  . Hypertension Mother   . Stroke Mother   . Ulcers Father 20       bleeding/ deceased  . Lymphoma Brother        cancer  . Breast cancer Sister   . Colon cancer Neg Hx   . Stomach cancer Neg Hx   . Rectal cancer Neg Hx   . Esophageal cancer Neg Hx   . Liver cancer Neg Hx     Social History   Tobacco Use  . Smoking status: Never Smoker  . Smokeless tobacco: Never Used  Substance Use Topics  . Alcohol use: Yes    Alcohol/week: 0.0 standard drinks  Comment: occasional   Marital Status: Single  ROS  Review of Systems  Constitutional: Positive for malaise/fatigue.  Cardiovascular: Positive for dyspnea on exertion and leg swelling. Negative for syncope.  Musculoskeletal: Positive for back pain.  Gastrointestinal: Negative for melena.  Neurological: Positive for tremors.   Objective  Blood pressure 124/67, pulse 82, temperature 98 F (36.7 C), temperature source Temporal, resp. rate 16, height 5\' 7"  (1.702 m), weight 188 lb 9.6 oz (85.5 kg), SpO2 94 %.  Vitals with BMI 07/13/2020 06/19/2020 06/19/2020  Height 5\' 7"  - -  Weight 188 lbs 10 oz - -  BMI 29.53 - -  Systolic 124 113 06/21/2020  Diastolic 67 66 62  Pulse 82 79 78     Physical Exam Constitutional:      General: He  is not in acute distress.    Appearance: He is well-developed.     Comments: Mildly oebese  Cardiovascular:     Rate and Rhythm: Normal rate. Rhythm irregular. Frequent extrasystoles are present.    Pulses: Intact distal pulses.     Heart sounds: No murmur heard. No gallop.      Comments: NO leg edema, no JVD.  Pulmonary:     Effort: Pulmonary effort is normal. No accessory muscle usage.     Breath sounds: Normal breath sounds.  Abdominal:     General: Bowel sounds are normal.     Palpations: Abdomen is soft.    Laboratory examination:    CMP Latest Ref Rng & Units 06/15/2020 05/30/2020  Glucose 65 - 99 mg/dL 99 06/17/2020)  BUN 8 - 27 mg/dL 14 16  Creatinine 07/28/2020 - 1.27 mg/dL 509(T 2.67  Sodium 1.24 - 144 mmol/L 138 138  Potassium 3.5 - 5.2 mmol/L 4.3 4.1  Chloride 96 - 106 mmol/L 102 100  CO2 20 - 29 mmol/L 20 23  Calcium 8.6 - 10.2 mg/dL 8.9 5.80)    External labs:    Lab 05/22/2020:  Hb 11.6/HCT 36.7, platelets not reported.  Normal indicis. Cholesterol, total 159.000 m 11/03/2019 HDL 53 MG/DL 05/24/2020 LDL 01/04/2020 mg 11/03/2019 Triglycerides 106.000 11/03/2019  Hemoglobin 12.400 g/d 11/03/2019  Creatinine, Serum 1.100 mg/ 11/03/2019 ALT (SGPT) 12.000 uni 11/03/2019 TSH 2.370 11/03/2019  Na/K 139/4.7  07/14/2019  Cholesterol, total 178.000 07/14/2019 Triglycerides 251.000 07/14/2019 HDL 39.000 07/14/2019 LDL-C 96.000 07/14/2019  BUN 22.000 07/14/2019 Creatinine, Serum 1.210 07/14/2019  A1C 5.700 07/14/2019  Glucose Random 117.000 07/14/2019  MicroAlbumin Urine 23.950 09/29/2018 MicroAlbumin/Creat 208.300 09/29/2018  PSA 7.850 06/17/2016  Medications and allergies  No Known Allergies   Current Outpatient Medications on File Prior to Visit  Medication Sig Dispense Refill  . acetaminophen (TYLENOL) 500 MG tablet Take 1,000 mg by mouth daily as needed (pain).    11/29/2018 alfuzosin (UROXATRAL) 10 MG 24 hr tablet Take 10 mg by mouth daily.    06/19/2016 apixaban (ELIQUIS) 5 MG TABS tablet Take 5 mg  by mouth 2 (two) times daily.    Marland Kitchen Bioflavonoid Products (VITAMIN C) CHEW Chew 1 tablet by mouth daily. Gummy    . carbidopa-levodopa (SINEMET IR) 25-100 MG tablet Take 1 tablet by mouth 3 (three) times daily. 270 tablet 1  . COD LIVER OIL PO Take 5 mLs by mouth daily.    Marland Kitchen ELDERBERRY PO Take 100 mg by mouth daily.    . fexofenadine (ALLEGRA) 180 MG tablet Take 180 mg by mouth every evening.    . finasteride (PROSCAR) 5 MG tablet Take 5 mg by mouth daily.  3  .  fluocinonide (LIDEX) 0.05 % external solution Apply 1 application topically See admin instructions. Instil 2 gtts in ear twice weekly    . folic acid (FOLVITE) 800 MCG tablet Take 800 mcg by mouth every evening.    . furosemide (LASIX) 20 MG tablet Take 20 mg by mouth daily.    . Glucosamine-Chondroitin (COSAMIN DS PO) Take 1 tablet by mouth in the morning and at bedtime.    . hydrochlorothiazide (MICROZIDE) 12.5 MG capsule Take 1 capsule by mouth daily.    . hyoscyamine (LEVSIN SL) 0.125 MG SL tablet Place 0.125 mg under the tongue as needed for cramping.     Marland Kitchen. KLOR-CON M10 10 MEQ tablet Take 10 mEq by mouth daily.    . modafinil (PROVIGIL) 100 MG tablet Take 1.5 tablets (150 mg total) by mouth daily. (Patient taking differently: Take 150 mg by mouth daily as needed (alertness with driving).) 45 tablet 3  . Multiple Vitamins-Minerals (PRESERVISION AREDS 2 PO) Take 1 tablet by mouth in the morning and at bedtime.    . Nutritional Supplements (JUICE PLUS FIBRE PO) Take 2 tablets by mouth in the morning and at bedtime. Take 2 tablets Fruit Blend+ in the morning & take 2 tablets Vegetable Blend+ in the evening    . pravastatin (PRAVACHOL) 40 MG tablet Take 40 mg by mouth at bedtime.  2  . Probiotic Product (ALIGN PO) Take 1 capsule by mouth daily.    . silodosin (RAPAFLO) 8 MG CAPS capsule Take 8 mg by mouth daily with breakfast.    . Travoprost, BAK Free, (TRAVATAN) 0.004 % SOLN ophthalmic solution Place 1 drop into both eyes at bedtime.     . vitamin B-12 (CYANOCOBALAMIN) 1000 MCG tablet Take 1,000 mcg by mouth daily.     No current facility-administered medications on file prior to visit.    Radiology:   Chest x-ray PA and lateral view 05/28/2020:  Heart size is normal, persistent bilateral pleural effusion at the lung bases compared to 05/22/2020 no change. Mild thoracic spondylosis.  Hyperinflation. Impression: Pneumonia versus minimal congestive heart failure, hyperinflation consistent with COPD.  Cardiac Studies:   Sleep study 03/31/2014 Positive for Sleep Apnea, follows Dr. Casilda Carlsohmeier  Lexiscan myoview 06/16/2014: SPECT images demonstrate a moderate-sized mild ischemia extending from the base towards the apex in the septal wall. Left ventricle systolic function was Calculated at 50%. There was mild septal hypokinesis. This represents an intermediate risk scan.  Event Monitor 30 days 05/10/2018:  Predominant rhythm is sinus rhythm with first-degree AV block. No symptoms reported. One 4 beat VT at 8:46PM, Occasional PVCs.  Lexiscan myoview stress test 06/21/2018:  1. Lexiscan stress test was performed. Exercise capacity was not assessed. No stress symptoms reported. Resting blood pressure was 136/72 mmHg and peak effect blood pressure was 140/70 mmHg. The resting and stress electrocardiogram demonstrated normal sinus rhythm, RBBB + LAHB, occasional PVC and normal rest repolarization.  Stress EKG is non diagnostic for ischemia as it is a pharmacologic stress.  2. The overall quality of the study is good. There is no evidence of abnormal lung activity. Stress and rest SPECT images demonstrate homogeneous tracer distribution throughout the myocardium. Gated SPECT imaging reveals mild global decrease in myocardial thickening and wall motion. The left ventricular ejection fraction was normal (46%).   3. High risk study due to reduced LVEF. No evidence of ischemia/ infarction.  Echocardiogram 06/07/2020:   1. Left ventricle cavity  is normal in size and wall thickness. Abnormal septal wall motion due  to left bundle branch block. Normal LV systolic function with EF 53%. Unable to evaluate diastolic function due to atrial fibrillation. Calculated EF 53%. 2. Left atrial cavity is mildly dilated. 3. Right atrial cavity is moderately dilated. 4. Mild to moderate mitral regurgitation. 5. Moderate tricuspid regurgitation. Estimated pulmonary artery systolic pressure 38 mmHg. 6. Insignificant pericardial effusion. 7. Compared to previous study in 2020, mild PH, pericardial effusion are new. Tricuspid regurgitation has increased from trace to moderate.  EKG   EKG 07/13/2020: 3 EKGs performed to confirm rhythm.  Baseline artifact. Sinus rhythm with first-degree AV block at rate of 75 bpm, left atrial enlargement, left axis deviation, left anterior fascicular block.  Right bundle branch block.  Trifascicular block.  Frequent PACs.  LVH with repolarization abnormality.   EKG 06/15/2020: Atrial fibrillation with controlled ventricular response at the rate of 94 bpm, left axis deviation, left anterior fascicular block.  Right bundle branch block.  Poor R wave progression, cannot exclude anterolateral infarct old.  Normal QT interval.  Compared to 05/31/2019, atypical atrial flutter.  Otherwise no significant change.   EKG 04/03/2020: Sinus rhythm with first-degree block at rate of 77 bpm, left axis deviation, left intrafascicular block.  Right bundle branch block.  Trifascicular block.  Poor R wave progression, cannot exclude anterolateral infarct old.  Unspecific T abnormality.   No significant change from EKG 09/19/2019,  03/17/2019.   Assessment     ICD-10-CM   1. Persistent atrial fibrillation (HCC)  I48.19 EKG 12-Lead  2. Trifascicular block  I45.3   3. Chronic diastolic (congestive) heart failure (HCC)  I50.32   4. Essential hypertension  I10     CHA2DS2-VASc Score is 4.  Yearly risk of stroke: 4.8% (A, HTN, Coronary  atherosclerosis).  Score of 1=0.6; 2=2.2; 3=3.2; 4=4.8; 5=7.2; 6=9.8; 7=>9.8) -(CHF; HTN; vasc disease DM,  Male = 1; Age <65 =0; 65-74 = 1,  >75 =2; stroke/embolism= 2).    No orders of the defined types were placed in this encounter.   There are no discontinued medications.  Recommendations:   Mr. Jacobey Gura is a fairly active 87 y.o. male who is very active and continues to live independently and works at [employer]. 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 Event monitor in January 2020 did not reveal any high degree AV block but had 4 beat NSVT, nuclear stress test on 08/20/2018 was nonischemic.   Patient was seen by me on 05/30/2020 with 3 weeks history of fatigue and shortness of breath and acute diastolic heart failure.  He was in persistent atrial fibrillation. He underwent direct-current cardioversion on 06/19/2020.   He has noticed marked improvement in dyspnea, fatigue and his exercise capacity is now back to baseline.  He has not had any dizziness or syncope.  EKG evaluation does reveal persistence of trifascicular block and frequent PACs.  There is also baseline artifact in view of Parkinson's disease.  No changes in the medications were done today.  He is enrolled in RPM and PCM due to chronic diastolic heart failure.  He wants to continue taking Lasix daily as his breathing is improved, he is on a low-dose, continue the same.  I will see him back in 2 months with repeat EKG.   Yates Decamp, MD, [facility] 07/13/2020, 11:15 AM Office: [phone] Pager: [phone]

## 2020-07-23 ENCOUNTER — Telehealth: Payer: Self-pay | Admitting: Pharmacist

## 2020-07-23 NOTE — Telephone Encounter (Signed)
CARE PLAN ENTRY  07/23/2020 Name: Nicholas Robbins MRN: [id number] DOB: 10/27/1928  Nicholas Robbins is enrolled in Remote Patient Monitoring/Principle Care Monitoring.  Date of Enrollment: 05/30/20 Supervising physician: Yates Decamp Indication: CHF  Remote Readings: Compliant; Wt stable at ~180-182.   Next scheduled OV: 09/13/20  Pharmacist Clinical Goal(s):  Marland Kitchen Over the next 90 days, patient will demonstrate Improved medication adherence as evidenced by medication fill history . Over the next 90 days, patient will demonstrate improved understanding of prescribed medications and rationale for usage as evidenced by patient teach back . Over the next 90 days, patient will experience decrease in ED visits. ED visits in last 6 months = 0 . Over the next 90 days, patient will not experience hospital admission. Hospital Admissions in last 6 months = 0  Interventions: . Provider and Inter-disciplinary care team collaboration (see longitudinal plan of care) . Comprehensive medication review performed. . Discussed plans with patient for ongoing care management follow up and provided patient with direct contact information for care management team . Collaboration with provider re: medication management  Patient Self Care Activities:  . Self administers medications as prescribed . Attends all scheduled provider appointments . Performs ADL's independently . Performs IADL's independently  No Known Allergies Outpatient Encounter Medications as of 07/23/2020  Medication Sig  . acetaminophen (TYLENOL) 500 MG tablet Take 1,000 mg by mouth daily as needed (pain).  Marland Kitchen alfuzosin (UROXATRAL) 10 MG 24 hr tablet Take 10 mg by mouth daily.  Marland Kitchen apixaban (ELIQUIS) 5 MG TABS tablet Take 5 mg by mouth 2 (two) times daily.  Marland Kitchen Bioflavonoid Products (VITAMIN C) CHEW Chew 1 tablet by mouth daily. Gummy  . carbidopa-levodopa (SINEMET IR) 25-100 MG tablet Take 1 tablet by mouth 3 (three) times daily.  . COD LIVER OIL  PO Take 5 mLs by mouth daily.  Marland Kitchen ELDERBERRY PO Take 100 mg by mouth daily.  . fexofenadine (ALLEGRA) 180 MG tablet Take 180 mg by mouth every evening.  . finasteride (PROSCAR) 5 MG tablet Take 5 mg by mouth daily.  . fluocinonide (LIDEX) 0.05 % external solution Apply 1 application topically See admin instructions. Instil 2 gtts in ear twice weekly  . folic acid (FOLVITE) 800 MCG tablet Take 800 mcg by mouth every evening.  . furosemide (LASIX) 20 MG tablet Take 20 mg by mouth daily.  . Glucosamine-Chondroitin (COSAMIN DS PO) Take 1 tablet by mouth in the morning and at bedtime.  . hydrochlorothiazide (MICROZIDE) 12.5 MG capsule Take 1 capsule by mouth daily.  . hyoscyamine (LEVSIN SL) 0.125 MG SL tablet Place 0.125 mg under the tongue as needed for cramping.   Marland Kitchen KLOR-CON M10 10 MEQ tablet Take 10 mEq by mouth daily.  . modafinil (PROVIGIL) 100 MG tablet Take 1.5 tablets (150 mg total) by mouth daily. (Patient taking differently: Take 150 mg by mouth daily as needed (alertness with driving).)  . Multiple Vitamins-Minerals (PRESERVISION AREDS 2 PO) Take 1 tablet by mouth in the morning and at bedtime.  . Nutritional Supplements (JUICE PLUS FIBRE PO) Take 2 tablets by mouth in the morning and at bedtime. Take 2 tablets Fruit Blend+ in the morning & take 2 tablets Vegetable Blend+ in the evening  . pravastatin (PRAVACHOL) 40 MG tablet Take 40 mg by mouth at bedtime.  . Probiotic Product (ALIGN PO) Take 1 capsule by mouth daily.  . silodosin (RAPAFLO) 8 MG CAPS capsule Take 8 mg by mouth daily with breakfast.  . Travoprost, BAK Free, (  TRAVATAN) 0.004 % SOLN ophthalmic solution Place 1 drop into both eyes at bedtime.  . vitamin B-12 (CYANOCOBALAMIN) 1000 MCG tablet Take 1,000 mcg by mouth daily.   No facility-administered encounter medications on file as of 07/23/2020.   Heart Failure   Type: Diastolic  Last ejection fraction: 53% NYHA Class: II (slight limitation of activity) AHA HF Stage: B  (Heart disease present - no symptoms present)  Patient has failed these meds in past: olmesartan, carvedilol,  Patient is currently controlled on the following medications: lasix 20 mg daily, HCTZ 12.5 mg daily.   We discussed diet and exercise extensively and weighing daily; if you gain more than 3 pounds in one day or 5 pounds in one week call your doctor  Plan  Continue current medications and control with diet and exercise ______________ Visit Information SDOH (Social Determinants of Health) assessments performed: Yes.  [name] was given information about Principle Care Management/Remote Patient Monitoring services today including:  1. RPM/PCM service includes personalized support from designated clinical staff supervised by his physician, including individualized plan of care and coordination with other care providers 2. 24/7 contact phone numbers for assistance for urgent and routine care needs. 3. Standard insurance, coinsurance, copays and deductibles apply for principle care management only during months in which we provide at least 30 minutes of these services. Most insurances cover these services at 100%, however patients may be responsible for any copay, coinsurance and/or deductible if applicable. This service may help you avoid the need for more expensive face-to-face services. 4. Only one practitioner may furnish and bill the service in a calendar month. 5. The patient may stop PCM/RPM services at any time (effective at the end of the month) by phone call to the office staff.  Patient agreed to services and verbal consent obtained.   Cassell Clement, Pharm.D. Clinical Pharmacist [facility] Cardiovascular [phone] Ext: [phone]

## 2020-09-13 ENCOUNTER — Ambulatory Visit: Payer: Medicare Other | Admitting: Cardiology

## 2020-09-13 ENCOUNTER — Encounter: Payer: Self-pay | Admitting: Cardiology

## 2020-09-13 ENCOUNTER — Other Ambulatory Visit: Payer: Self-pay

## 2020-09-13 VITALS — BP 122/79 | HR 75 | Temp 98.2°F | Resp 17 | Ht 67.0 in | Wt 181.8 lb

## 2020-09-13 DIAGNOSIS — I5032 Chronic diastolic (congestive) heart failure: Secondary | ICD-10-CM

## 2020-09-13 DIAGNOSIS — I453 Trifascicular block: Secondary | ICD-10-CM

## 2020-09-13 DIAGNOSIS — I4819 Other persistent atrial fibrillation: Secondary | ICD-10-CM

## 2020-09-13 DIAGNOSIS — I484 Atypical atrial flutter: Secondary | ICD-10-CM

## 2020-09-13 MED ORDER — LOSARTAN POTASSIUM-HCTZ 50-12.5 MG PO TABS: 1.0000 | ORAL_TABLET | Freq: Every morning | ORAL | 2 refills | Status: DC

## 2020-09-13 MED ORDER — KLOR-CON M10 10 MEQ PO TBCR: 10.0000 meq | EXTENDED_RELEASE_TABLET | Freq: Every day | ORAL | Status: DC | PRN

## 2020-09-13 MED ORDER — FUROSEMIDE 20 MG PO TABS: 20.0000 mg | ORAL_TABLET | Freq: Every day | ORAL | 1 refills | Status: DC | PRN

## 2020-09-13 NOTE — Progress Notes (Signed)
Primary Physician/Referring:  Rodrigo Ran, MD  Patient ID: Nicholas Robbins, male    DOB: 1928/12/15, 87 y.o.   MRN: [id number]  Chief Complaint  Patient presents with  . Atrial Fibrillation  . TRIFASCICULAR BLOCK    2 MONTHS   HPI:    Nicholas Robbins  is a 87 y.o. male who is very active and continues to live independently and works at [employer]. [facility] in [place], [place], coronary calcification noted by CT scan in 2016,  trifascicular block on the EKG, episode of syncope on 05/07/2018, with no recurrence and OSA and compliant on CPAP. Event monitor in January 2020 did not reveal any high degree AV block but had 4 beat NSVT, nuclear stress test on 08/20/2018 was nonischemic.   Patient was seen by me on 05/30/2020 with 3 weeks history of fatigue and shortness of breath and acute diastolic heart failure.  He was in persistent atrial fibrillation. He underwent direct-current cardioversion on 06/19/2020.  However although he found mild improvement in dyspnea, he is back in atrial flutter.  Again he has noticed worsening dyspnea and fatigue.  With regard to chronic diastolic heart failure, he has lost close to about 15 to 20 pounds in weight with marked improvement in leg edema.  In spite of this he is still having fatigue and dyspnea.  No PND or orthopnea.  He is tolerating all his medications well.  No dizziness or syncope.   Past Medical History:  Diagnosis Date  . Arthritis   . Asthma   . Benign essential tremor   . BPH (benign prostatic hyperplasia)   . Central serous retinopathy   . COAG (chronic open-angle glaucoma)   . Colon polyps   . Diverticulitis   . Essential hypertension 06/22/2018  . Glaucoma   . Hypercholesterolemia 06/22/2018  . Hyperlipidemia   . Hypertension   . Macular degeneration   . Macular hole   . NSVT (nonsustained ventricular tachycardia) (HCC) 06/22/2018   Event Monitor 30 days 05/10/2018: Performed for syncope Predominant rhythm is sinus rhythm with  first-degree AV block. No symptoms reported. One 4 beat VT at 8:46PM, Occasional PVCs.  . OSA on CPAP    use CPAP nightly  . Pneumonia 05/21/2020  . Pseudophakia of both eyes   . Syncope and collapse 05/07/2018  . 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 BACK SURGERY     lumbar fusion  . broken ankle  1961  . CARDIOVERSION N/A 06/19/2020   Procedure: CARDIOVERSION;  Surgeon: Yates Decamp, MD;  Location: [facility] ENDOSCOPY;  Service: Cardiovascular;  Laterality: N/A;  . CARPAL TUNNEL RELEASE Right 10/15/2017   Procedure: RIGHT CARPAL TUNNEL RELEASE;  Surgeon: Cindee Salt, MD;  Location: [facility];  Service: Orthopedics;  Laterality: Right;  . CATARACT EXTRACTION, BILATERAL    . HERNIA REPAIR    . REPLACEMENT TOTAL KNEE  2005  . VITRECTOMY Left 2006   Family History  Problem Relation Age of Onset  . Hypertension Mother   . Stroke Mother   . Ulcers Father 40       bleeding/ deceased  . Lymphoma Brother        cancer  . Breast cancer Sister   . Colon cancer Neg Hx   . Stomach cancer Neg Hx   . Rectal cancer Neg Hx   . Esophageal cancer Neg Hx   . Liver cancer Neg Hx     Social History  Tobacco Use  . Smoking status: Never Smoker  . Smokeless tobacco: Never Used  Substance Use Topics  . Alcohol use: Yes    Alcohol/week: 0.0 standard drinks    Comment: occasional   Marital Status: Single  ROS  Review of Systems  Constitutional: Positive for malaise/fatigue.  Cardiovascular: Positive for dyspnea on exertion and leg swelling. Negative for syncope.  Musculoskeletal: Positive for back pain.  Gastrointestinal: Negative for melena.  Neurological: Positive for tremors.   Objective  Blood pressure 122/79, pulse 75, temperature 98.2 F (36.8 C), temperature source Temporal, resp. rate 17, height 5\' 7"  (1.702 m), weight 181 lb 12.8 oz (82.5 kg), SpO2 98 %.  Vitals with BMI 09/13/2020 07/13/2020 06/19/2020  Height 5\' 7"  5\' 7"  -   Weight 181 lbs 13 oz 188 lbs 10 oz -  BMI 28.47 29.53 -  Systolic 122 124 06/21/2020  Diastolic 79 67 66  Pulse 75 82 79     Physical Exam Constitutional:      General: He is not in acute distress.    Appearance: He is well-developed.     Comments: Mildly oebese  Cardiovascular:     Rate and Rhythm: Normal rate. Rhythm irregular. Frequent extrasystoles are present.    Pulses: Intact distal pulses.     Heart sounds: No murmur heard. No gallop.      Comments: NO leg edema, no JVD.  Pulmonary:     Effort: Pulmonary effort is normal. No accessory muscle usage.     Breath sounds: Normal breath sounds.  Abdominal:     General: Bowel sounds are normal.     Palpations: Abdomen is soft.  Musculoskeletal:     Right lower leg: Edema (2 +) present.     Left lower leg: No edema.    Laboratory examination:    CMP Latest Ref Rng & Units 06/15/2020 05/30/2020  Glucose 65 - 99 mg/dL 99 315)  BUN 8 - 27 mg/dL 14 16  Creatinine 06/17/2020 - 1.27 mg/dL 07/28/2020 400(Q  Sodium 6.76 - 144 mmol/L 138 138  Potassium 3.5 - 5.2 mmol/L 4.3 4.1  Chloride 96 - 106 mmol/L 102 100  CO2 20 - 29 mmol/L 20 23  Calcium 8.6 - 10.2 mg/dL 8.9 1.95)    External labs:    Lab 05/22/2020:  Hb 11.6/HCT 36.7, platelets not reported.  Normal indicis. Cholesterol, total 159.000 m 11/03/2019 HDL 53 MG/DL 1.2(W LDL 05/24/2020 mg 11/03/2019 Triglycerides 106.000 11/03/2019  Hemoglobin 12.400 g/d 11/03/2019  Creatinine, Serum 1.100 mg/ 11/03/2019 ALT (SGPT) 12.000 uni 11/03/2019 TSH 2.370 11/03/2019  Na/K 139/4.7  07/14/2019  Cholesterol, total 178.000 07/14/2019 Triglycerides 251.000 07/14/2019 HDL 39.000 07/14/2019 LDL-C 96.000 07/14/2019  BUN 22.000 07/14/2019 Creatinine, Serum 1.210 07/14/2019  A1C 5.700 07/14/2019  Glucose Random 117.000 07/14/2019  MicroAlbumin Urine 23.950 09/29/2018 MicroAlbumin/Creat 208.300 09/29/2018  PSA 7.850 06/17/2016  Medications and allergies  No Known Allergies   Current Outpatient Medications on  File Prior to Visit  Medication Sig Dispense Refill  . acetaminophen (TYLENOL) 500 MG tablet Take 1,000 mg by mouth daily as needed (pain).    11/29/2018 alfuzosin (UROXATRAL) 10 MG 24 hr tablet Take 10 mg by mouth daily.    11/29/2018 apixaban (ELIQUIS) 5 MG TABS tablet Take 5 mg by mouth 2 (two) times daily.    06/19/2016 Bioflavonoid Products (VITAMIN C) CHEW Chew 1 tablet by mouth daily. Gummy    . carbidopa-levodopa (SINEMET IR) 25-100 MG tablet Take 1 tablet by mouth 3 (three) times daily. 270  tablet 1  . COD LIVER OIL PO Take 5 mLs by mouth daily.    Marland Kitchen ELDERBERRY PO Take 100 mg by mouth daily.    . fexofenadine (ALLEGRA) 180 MG tablet Take 180 mg by mouth every evening.    . finasteride (PROSCAR) 5 MG tablet Take 5 mg by mouth daily.  3  . fluocinonide (LIDEX) 0.05 % external solution Apply 1 application topically See admin instructions. Instil 2 gtts in ear twice weekly    . folic acid (FOLVITE) 800 MCG tablet Take 800 mcg by mouth every evening.    . Glucosamine-Chondroitin (COSAMIN DS PO) Take 1 tablet by mouth in the morning and at bedtime.    . hyoscyamine (LEVSIN SL) 0.125 MG SL tablet Place 0.125 mg under the tongue as needed for cramping.     . modafinil (PROVIGIL) 100 MG tablet Take 1.5 tablets (150 mg total) by mouth daily. (Patient taking differently: Take 150 mg by mouth daily as needed (alertness with driving).) 45 tablet 3  . Multiple Vitamins-Minerals (PRESERVISION AREDS 2 PO) Take 1 tablet by mouth in the morning and at bedtime.    . Nutritional Supplements (JUICE PLUS FIBRE PO) Take 2 tablets by mouth in the morning and at bedtime. Take 2 tablets Fruit Blend+ in the morning & take 2 tablets Vegetable Blend+ in the evening    . pravastatin (PRAVACHOL) 40 MG tablet Take 40 mg by mouth at bedtime.  2  . Probiotic Product (ALIGN PO) Take 1 capsule by mouth daily.    . silodosin (RAPAFLO) 8 MG CAPS capsule Take 8 mg by mouth daily with breakfast.    . Travoprost, BAK Free, (TRAVATAN) 0.004 % SOLN  ophthalmic solution Place 1 drop into both eyes at bedtime.    . vitamin B-12 (CYANOCOBALAMIN) 1000 MCG tablet Take 1,000 mcg by mouth daily.     No current facility-administered medications on file prior to visit.    Radiology:   Chest x-ray PA and lateral view 05/28/2020:  Heart size is normal, persistent bilateral pleural effusion at the lung bases compared to 05/22/2020 no change. Mild thoracic spondylosis.  Hyperinflation. Impression: Pneumonia versus minimal congestive heart failure, hyperinflation consistent with COPD.  Cardiac Studies:   Sleep study 03/31/2014 Positive for Sleep Apnea, follows Dr. Casilda Carls myoview 06/16/2014: SPECT images demonstrate a moderate-sized mild ischemia extending from the base towards the apex in the septal wall. Left ventricle systolic function was Calculated at 50%. There was mild septal hypokinesis. This represents an intermediate risk scan.  Event Monitor 30 days 05/10/2018:  Predominant rhythm is sinus rhythm with first-degree AV block. No symptoms reported. One 4 beat VT at 8:46PM, Occasional PVCs.  Lexiscan myoview stress test 06/21/2018:  1. Lexiscan stress test was performed. Exercise capacity was not assessed. No stress symptoms reported. Resting blood pressure was 136/72 mmHg and peak effect blood pressure was 140/70 mmHg. The resting and stress electrocardiogram demonstrated normal sinus rhythm, RBBB + LAHB, occasional PVC and normal rest repolarization.  Stress EKG is non diagnostic for ischemia as it is a pharmacologic stress.  2. The overall quality of the study is good. There is no evidence of abnormal lung activity. Stress and rest SPECT images demonstrate homogeneous tracer distribution throughout the myocardium. Gated SPECT imaging reveals mild global decrease in myocardial thickening and wall motion. The left ventricular ejection fraction was normal (46%).   3. High risk study due to reduced LVEF. No evidence of ischemia/  infarction.  Echocardiogram 06/07/2020:  1. Left ventricle cavity is normal in size and wall thickness. Abnormal septal wall motion due to left bundle branch block. Normal LV systolic function with EF 53%. Unable to evaluate diastolic function due to atrial fibrillation. Calculated EF 53%. 2. Left atrial cavity is mildly dilated. 3. Right atrial cavity is moderately dilated. 4. Mild to moderate mitral regurgitation. 5. Moderate tricuspid regurgitation. Estimated pulmonary artery systolic pressure 38 mmHg. 6. Insignificant pericardial effusion. 7. Compared to previous study in 2020, mild PH, pericardial effusion are new. Tricuspid regurgitation has increased from trace to moderate.  EKG   EKG 09/13/2020: Atypical atrial flutter with variable AV conduction at rate of 76 bpm.  Left axis deviation, left anterior fascicular block.  Right bundle branch block. Poor R progression, CRO anterolateral infarct old.  PVCs (2).  No significant change from 07/13/2020.   EKG 06/15/2020: Atrial fibrillation with controlled ventricular response at the rate of 94 bpm, left axis deviation, left anterior fascicular block.  Right bundle branch block.  Poor R wave progression, cannot exclude anterolateral infarct old.  Normal QT interval.  Compared to 05/31/2019, atypical atrial flutter.  Otherwise no significant change.   EKG 04/03/2020: Sinus rhythm with first-degree block at rate of 77 bpm, left axis deviation, left intrafascicular block.  Right bundle branch block.  Trifascicular block.  Poor R wave progression, cannot exclude anterolateral infarct old.  Unspecific T abnormality.   No significant change from EKG 09/19/2019,  03/17/2019.   Assessment     ICD-10-CM   1. Persistent atrial fibrillation (HCC)  I48.19 EKG 12-Lead  2. Atypical atrial flutter (HCC)  I48.4 Ambulatory referral to Cardiac Electrophysiology  3. Trifascicular block  I45.3 Ambulatory referral to Cardiac Electrophysiology  4. Chronic  diastolic (congestive) heart failure (HCC)  I50.32 losartan-hydrochlorothiazide (HYZAAR) 50-12.5 MG tablet    furosemide (LASIX) 20 MG tablet    KLOR-CON M10 10 MEQ tablet    Brain natriuretic peptide    Basic metabolic panel    CBC    CHA2DS2-VASc Score is 4.  Yearly risk of stroke: 4.8% (A, HTN, Coronary atherosclerosis).  Score of 1=0.6; 2=2.2; 3=3.2; 4=4.8; 5=7.2; 6=9.8; 7=>9.8) -(CHF; HTN; vasc disease DM,  Male = 1; Age <65 =0; 65-74 = 1,  >75 =2; stroke/embolism= 2).    Meds ordered this encounter  Medications  . losartan-hydrochlorothiazide (HYZAAR) 50-12.5 MG tablet    Sig: Take 1 tablet by mouth every morning.    Dispense:  30 tablet    Refill:  2  . furosemide (LASIX) 20 MG tablet    Sig: Take 1 tablet (20 mg total) by mouth daily as needed for fluid or edema.    Dispense:  30 tablet    Refill:  1  . KLOR-CON M10 10 MEQ tablet    Sig: Take 1 tablet (10 mEq total) by mouth daily as needed (With Lasix).    Medications Discontinued During This Encounter  Medication Reason  . hydrochlorothiazide (MICROZIDE) 12.5 MG capsule Completed Course  . furosemide (LASIX) 20 MG tablet Reorder  . KLOR-CON M10 10 MEQ tablet Reorder    Recommendations:   Nicholas Robbins is a fairly active 87 y.o. male who is very active and continues to live independently and works at [employer]. [facility] in [place], [place], coronary calcification noted by CT scan in 2016,  trifascicular block on the EKG, episode of syncope on 05/07/2018, with no recurrence and OSA and compliant on CPAP. Event monitor in January 2020 did not reveal any high  degree AV block but had 4 beat NSVT, nuclear stress test on 08/20/2018 was nonischemic.   Is now being treated for acute on chronic diastolic heart failure, he underwent direct-current cardioversion for persistent atrial fibrillation/atypical atrial flutter on 06/19/2020, however he is back in atypical atrial flutter.  Although he is losing weight and has  been careful with his weight and food intake and salt intake, he still persist to have mild decrease in exercise capacity and also dyspnea.  Today again the EKG confirms recurrence of atypical atrial flutter. In view of underlying trifascicular block, I am extremely concerned about using any antiarrhythmic drugs.  I would like to get an opinion from EP as I do think he will maintain sinus rhythm even if I would repeat cardioversion.  He is enrolled in RPM and PCM due to chronic diastolic heart failure.  I started him on losartan HCT 50/12.5 mg in the morning, will change his Lasix to be taken on a as needed basis.  We will obtain a BMP, BNP and CBC in 2 weeks from now.  I would like to see him back in 4 to 6 weeks for follow-up.     40-minute encounter in making complex decisions.   Yates DecampJay Pryce Folts, MD, [facility]aFACC 09/13/2020, 10:43 AM Office: [phone] Pager: [phone]

## 2020-09-13 NOTE — Patient Instructions (Addendum)
I have started you on a new medication called losartan HCT, please take 1 tablet once a day in the morning.  I have changed your Lasix, do not take it daily.  Please take only if needed when you have excess fluid or leg swelling or you have gained 3 pounds of weight in a day.    You will also take potassium only when you take Lasix  You will need blood work to be done in 2 weeks at [facility].  I have referred him to be evaluated by electrophysiologist to discuss regarding rhythm issues and possible pacemaker implantation.

## 2020-09-18 ENCOUNTER — Ambulatory Visit: Payer: Medicare Other | Admitting: Adult Health

## 2020-09-18 ENCOUNTER — Institutional Professional Consult (permissible substitution): Payer: Medicare Other | Admitting: Cardiology

## 2020-09-18 ENCOUNTER — Encounter: Payer: Self-pay | Admitting: Adult Health

## 2020-09-18 VITALS — BP 117/67 | HR 58 | Ht 68.0 in | Wt 181.0 lb

## 2020-09-18 DIAGNOSIS — G4733 Obstructive sleep apnea (adult) (pediatric): Secondary | ICD-10-CM | POA: Diagnosis not present

## 2020-09-18 DIAGNOSIS — G20 Parkinson's disease: Secondary | ICD-10-CM | POA: Diagnosis not present

## 2020-09-18 DIAGNOSIS — Z9989 Dependence on other enabling machines and devices: Secondary | ICD-10-CM

## 2020-09-18 DIAGNOSIS — K117 Disturbances of salivary secretion: Secondary | ICD-10-CM

## 2020-09-18 NOTE — Patient Instructions (Signed)
Your Plan:  Continue CPAP- we will call DME about mask Continue Sinemet 25-100 mg three times a day      Thank you for coming to see [place] at [facility]. I hope we have been able to provide you high quality care today.  You may receive a patient satisfaction survey over the next few weeks. We would appreciate your feedback and comments so that we may continue to improve ourselves and the health of our patients.

## 2020-09-18 NOTE — Progress Notes (Signed)
PATIENT: Nicholas Robbins DOB: 22-Feb-1929  REASON FOR VISIT: follow up HISTORY FROM: patient  HISTORY OF PRESENT ILLNESS: Today 09/18/20:  Nicholas Robbins is an 87 year old male with a history of Parkinson's disease and obstructive sleep apnea on CPAP.  He returns today for follow-up.   At the last visit a mask refitting was ordered for the patient.  He states that they change in size of mass to a small however they have continued to send the large mass.  He states that he has tried reaching out but nothing has changed.He takes Provigil as needed.  He remains on Sinemet 25-100 mg 1 tablet TID.  He feels that the tremor in his left arm has increased.  Denies any changes with his gait.  Reports that his balance is slightly off due to his back issues.  Denies any falls.  Denies any trouble chewing or swallowing food.  He returns today for an evaluation.      His CPAP download indicates that he uses machine nightly for compliance of 100%.  He uses machine greater than 4 hours each night.  On average he uses his machine 6 hours and 49 minutes.  His residual AHI is 14.3 on 5.6-16 centimeters of water.  Leak in the 95th percentile is 35.9 L/min.  Reports that he continues to take Provigil but only takes it on an as-needed basis.  He does feel that it is not strong enough states that he gets still get sleepy especially when driving.  States that he does have the mask on relatively tight.  He has nasal pillows.  States that there are several nights that he wakes up with bubbles in his mouth.  He remains on Sinemet 1 tablet 3 times a day.  Tremor in the left hand.  Denies any significant changes with his gait or balance.  Uses a cane when ambulating.  Able to complete all ADLs independently.  Denies any trouble chewing or swallowing food.  Returns today for an evaluation.  HISTORY 09/13/19: Nicholas Robbins is an 87 year old male with a history of Parkinson disease and obstructive sleep apnea on CPAP.  His  download indicates that he uses machine nightly for compliance of 100%.  He uses machine greater than 4 hours each night.  On average he uses his machine 7 hours and 15 minutes.  His residual AHI is 7.7 on 5 to 16 cm of water with EPR of 1.  Leak in the 95th percentile is 35.4 L/min.  He remains on modafinil but only uses this when he is traveling.  He continues to take Sinemet 1 tablet 3 times a day.  Reports that he often misses the second dose.  Reports that he continues to have a tremor in the left hand.  Denies any significant changes with his gait or balance.  He does use a cane.  Reports that he is a little more stiff at times.  Denies any trouble chewing or swallowing food.  REVIEW OF SYSTEMS: Out of a complete 14 system review of symptoms, the patient complains only of the following symptoms, and all other reviewed systems are negative.  See HPI  ALLERGIES: No Known Allergies  HOME MEDICATIONS: Outpatient Medications Prior to Visit  Medication Sig Dispense Refill  . acetaminophen (TYLENOL) 500 MG tablet Take 1,000 mg by mouth daily as needed (pain).    Marland Kitchen alfuzosin (UROXATRAL) 10 MG 24 hr tablet Take 10 mg by mouth daily.    Marland Kitchen apixaban (ELIQUIS) 5 MG  TABS tablet Take 5 mg by mouth 2 (two) times daily.    Marland Kitchen. Bioflavonoid Products (VITAMIN C) CHEW Chew 1 tablet by mouth daily. Gummy    . carbidopa-levodopa (SINEMET IR) 25-100 MG tablet Take 1 tablet by mouth 3 (three) times daily. 270 tablet 1  . COD LIVER OIL PO Take 5 mLs by mouth daily.    Marland Kitchen. ELDERBERRY PO Take 100 mg by mouth daily.    . fexofenadine (ALLEGRA) 180 MG tablet Take 180 mg by mouth every evening.    . finasteride (PROSCAR) 5 MG tablet Take 5 mg by mouth daily.  3  . fluocinonide (LIDEX) 0.05 % external solution Apply 1 application topically See admin instructions. Instil 2 gtts in ear twice weekly    . folic acid (FOLVITE) 800 MCG tablet Take 800 mcg by mouth every evening.    . Glucosamine-Chondroitin (COSAMIN DS PO)  Take 1 tablet by mouth in the morning and at bedtime.    . hyoscyamine (LEVSIN SL) 0.125 MG SL tablet Place 0.125 mg under the tongue as needed for cramping.     Marland Kitchen. losartan-hydrochlorothiazide (HYZAAR) 50-12.5 MG tablet Take 1 tablet by mouth every morning. 30 tablet 2  . modafinil (PROVIGIL) 100 MG tablet Take 1.5 tablets (150 mg total) by mouth daily. (Patient taking differently: Take 150 mg by mouth daily as needed (alertness with driving).) 45 tablet 3  . Multiple Vitamins-Minerals (PRESERVISION AREDS 2 PO) Take 1 tablet by mouth in the morning and at bedtime.    . Nutritional Supplements (JUICE PLUS FIBRE PO) Take 2 tablets by mouth in the morning and at bedtime. Take 2 tablets Fruit Blend+ in the morning & take 2 tablets Vegetable Blend+ in the evening    . pravastatin (PRAVACHOL) 40 MG tablet Take 40 mg by mouth at bedtime.  2  . Probiotic Product (ALIGN PO) Take 1 capsule by mouth daily.    . silodosin (RAPAFLO) 8 MG CAPS capsule Take 8 mg by mouth daily with breakfast.    . Travoprost, BAK Free, (TRAVATAN) 0.004 % SOLN ophthalmic solution Place 1 drop into both eyes at bedtime.    . vitamin B-12 (CYANOCOBALAMIN) 1000 MCG tablet Take 1,000 mcg by mouth daily.    . furosemide (LASIX) 20 MG tablet Take 1 tablet (20 mg total) by mouth daily as needed for fluid or edema. 30 tablet 1  . KLOR-CON M10 10 MEQ tablet Take 1 tablet (10 mEq total) by mouth daily as needed (With Lasix).     No facility-administered medications prior to visit.    PAST MEDICAL HISTORY: Past Medical History:  Diagnosis Date  . Arthritis   . Asthma   . Benign essential tremor   . BPH (benign prostatic hyperplasia)   . Central serous retinopathy   . COAG (chronic open-angle glaucoma)   . Colon polyps   . Diverticulitis   . Essential hypertension 06/22/2018  . Glaucoma   . Hypercholesterolemia 06/22/2018  . Hyperlipidemia   . Hypertension   . Macular degeneration   . Macular hole   . NSVT (nonsustained  ventricular tachycardia) (HCC) 06/22/2018   Event Monitor 30 days 05/10/2018: Performed for syncope Predominant rhythm is sinus rhythm with first-degree AV block. No symptoms reported. One 4 beat VT at 8:46PM, Occasional PVCs.  . OSA on CPAP    use CPAP nightly  . Pneumonia 05/21/2020  . Pseudophakia of both eyes   . Syncope and collapse 05/07/2018  . 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 BACK SURGERY     lumbar fusion  . broken ankle  1961  . CARDIOVERSION N/A 06/19/2020   Procedure: CARDIOVERSION;  Surgeon: Yates Decamp, MD;  Location: [facility];  Service: Cardiovascular;  Laterality: N/A;  . CARPAL TUNNEL RELEASE Right 10/15/2017   Procedure: RIGHT CARPAL TUNNEL RELEASE;  Surgeon: Cindee Salt, MD;  Location: [facility];  Service: Orthopedics;  Laterality: Right;  . CATARACT EXTRACTION, BILATERAL    . HERNIA REPAIR    . REPLACEMENT TOTAL KNEE  2005  . VITRECTOMY Left 2006    FAMILY HISTORY: Family History  Problem Relation Age of Onset  . Hypertension Mother   . Stroke Mother   . Ulcers Father 22       bleeding/ deceased  . Lymphoma Brother        cancer  . Breast cancer Sister   . Colon cancer Neg Hx   . Stomach cancer Neg Hx   . Rectal cancer Neg Hx   . Esophageal cancer Neg Hx   . Liver cancer Neg Hx     SOCIAL HISTORY: Social History   Socioeconomic History  . Marital status: Single    Spouse name: Not on file  . Number of children: 0  . Years of education: Not on file  . Highest education level: Not on file  Occupational History  . 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 Smoking status: Never Smoker  . Smokeless tobacco: Never Used  Vaping Use  . Vaping Use: Never used  Substance and Sexual Activity  . Alcohol use: Yes    Alcohol/week: 0.0 standard drinks    Comment: occasional  . Drug use: No  . Sexual activity: Not on file  Other Topics Concern  . Not on  file  Social History Narrative   Daily Caffeine.  Lives at home,  With [facility].  Works at [employer].  Lillette Boxer, post graduate.   Single, no kids.     Social Determinants of Health   Financial Resource Strain: Not on file  Food Insecurity: Not on file  Transportation Needs: Not on file  Physical Activity: Not on file  Stress: Not on file  Social Connections: Not on file  Intimate Partner Violence: Not on file      PHYSICAL EXAM  Vitals:   09/18/20 0926  Weight: 181 lb (82.1 kg)  Height: 5\' 8"  (1.727 m)   Body mass index is 27.52 kg/m.  Generalized: Well developed, in no acute distress   Neurological examination  Mentation: Alert oriented to time, place, history taking. Follows all commands speech and language fluent Cranial nerve II-XII: Pupils were equal round reactive to light. Extraocular movements were full, visual field were full on confrontational test. Head turning and shoulder shrug  were normal and symmetric. Motor: The motor testing reveals 5 over 5 strength of all 4 extremities. Good symmetric motor tone is noted throughout.  Moderate impairment of finger taps in the left hand.  Mild impairment on the right. Sensory: Sensory testing is intact to soft touch on all 4 extremities. No evidence of extinction is noted. Resting tremor noted in left arm. Coordination: Cerebellar testing reveals good finger-nose-finger and heel-to-shin bilaterally.  Gait and station: Patient uses a cane when ambulating.  Stooped posture. Leans to the right. Good stride. Reflexes: Deep tendon reflexes are symmetric and normal bilaterally.   DIAGNOSTIC DATA (LABS, IMAGING, TESTING) - I reviewed  patient records, labs, notes, testing and imaging myself where available.     ASSESSMENT AND PLAN 87 y.o. year old male  has a past medical history of Arthritis, Asthma, Benign essential tremor, BPH (benign prostatic hyperplasia), Central serous retinopathy, COAG (chronic  open-angle glaucoma), Colon polyps, Diverticulitis, Essential hypertension (06/22/2018), Glaucoma, Hypercholesterolemia (06/22/2018), Hyperlipidemia, Hypertension, Macular degeneration, Macular hole, NSVT (nonsustained ventricular tachycardia) (HCC) (06/22/2018), OSA on CPAP, Pneumonia (05/21/2020), Pseudophakia of both eyes, Syncope and collapse (05/07/2018), and 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 here wih:  1.  Obstructive sleep apnea on CPAP  -Good compliance -Residual AHI elevated perhaps due to increased leak - we will call DME company to advise they are sending him the wrong size mask. - continue Provigil 150 mg daily PRN. -Encourage patient continue using CPAP nightly and greater than 4 hours each night  2.  Parkinson's disease  -Continue Sinemet 1 tablet 3 times a day  3.  Sialorrhea due to Parkinson's disease  -Interested in Botox for sialorrhea-we will reach out to Dr. Terrace Arabia to see if she will evaluate the patient for injections  Follow-up in 6 months or sooner if needed  .  Butch Penny, MSN, NP-C 09/18/2020, 9:28 AM [facility] [address] [place], [place] [address] [phone]

## 2020-09-19 ENCOUNTER — Telehealth: Payer: Self-pay | Admitting: Adult Health

## 2020-09-19 NOTE — Telephone Encounter (Signed)
Dr. Beryl Meager saw this patient in clinic yesterday he is concerned about sialorrhea and interested in Botox therapy.  He is a patient of Dr. Oliva Bustard.  She does not complete these type of injections.  Would you be willing to see the patient and perform Botox if you deem necessary?

## 2020-09-19 NOTE — Telephone Encounter (Signed)
Chart reviewed, likely a candidate for BOTOX  Ask for BOTOX A 100 units and put him on my schedule, I will see him and inject him at one trip.

## 2020-09-20 ENCOUNTER — Telehealth: Payer: Self-pay | Admitting: Cardiology

## 2020-09-20 LAB — BASIC METABOLIC PANEL
BUN/Creatinine Ratio: 18 (ref 10–24)
BUN: 19 mg/dL (ref 8–27)
CO2: 21 mmol/L (ref 20–29)
Calcium: 9.3 mg/dL (ref 8.6–10.2)
Chloride: 99 mmol/L (ref 96–106)
Creatinine, Ser: 1.05 mg/dL (ref 0.76–1.27)
Glucose: 110 mg/dL — ABNORMAL HIGH (ref 65–99)
Potassium: 3.9 mmol/L (ref 3.5–5.2)
Sodium: 136 mmol/L (ref 134–144)
eGFR: 68 mL/min/{1.73_m2} (ref >59)

## 2020-09-20 LAB — CBC
Hematocrit: 35.5 % — ABNORMAL LOW (ref 37.5–51.0)
Hemoglobin: 11.7 g/dL — ABNORMAL LOW (ref 13.0–17.7)
MCH: 28.5 pg (ref 26.6–33.0)
MCHC: 33 g/dL (ref 31.5–35.7)
MCV: 87 fL (ref 79–97)
Platelets: 171 10*3/uL (ref 150–450)
RBC: 4.1 x10E6/uL — ABNORMAL LOW (ref 4.14–5.80)
RDW: 14.2 % (ref 11.6–15.4)
WBC: 9.4 10*3/uL (ref 3.4–10.8)

## 2020-09-20 LAB — BRAIN NATRIURETIC PEPTIDE: BNP: 412.1 pg/mL — ABNORMAL HIGH (ref 0.0–100.0)

## 2020-09-20 NOTE — Telephone Encounter (Signed)
Nicholas Robbins- can you set this up?

## 2020-09-21 ENCOUNTER — Other Ambulatory Visit: Payer: Self-pay | Admitting: Adult Health

## 2020-09-25 NOTE — Telephone Encounter (Signed)
Andrey Campanile- can you do the charge from for botox for sialorrhea

## 2020-09-25 NOTE — Telephone Encounter (Signed)
BOTOX charge to [facility].

## 2020-09-27 NOTE — Telephone Encounter (Signed)
Will f/u EP consultation

## 2020-10-01 NOTE — Telephone Encounter (Signed)
Received charge sheet for 100 units of Botox (O8416) for K11.7 ([id number]). Patient has [facility] Medicare. I called them @ [phone] and spoke with Kia to check codes J0585 & [phone].  Kia states PA is not required. Patient can use [facility]. Reference #[id number].  I filled out [facility] enrollment form for Botox prescription. I will give to MD to sign.  Patient will need to sign consent at first appointment.

## 2020-10-02 NOTE — Telephone Encounter (Signed)
Faxed prescription enrollment form to [facility].

## 2020-10-03 NOTE — Telephone Encounter (Signed)
Received request from [employer] to complete ePA via CMM. Completed ePA. Received approval.  [id number] (10/03/20- 01/03/21).

## 2020-10-04 ENCOUNTER — Ambulatory Visit: Payer: Medicare Other | Admitting: Cardiology

## 2020-10-10 NOTE — Telephone Encounter (Signed)
I called patient and LVM requesting CB to discuss Botox & to schedule an appointment.

## 2020-10-11 NOTE — Progress Notes (Signed)
Electrophysiology Office Note:    Date:  10/12/2020   ID:  Nicholas Robbins, DOB [birth year], MRN [id number]  PCP:  Nicholas Ran, MD  [facility] Cardiologist:  None  [facility] Electrophysiologist:  Nicholas Prude, MD   Referring MD: Nicholas Decamp, MD   Chief Complaint: Symptomatic atrial flutter and trifascicular block  History of Present Illness:    Nicholas Robbins is a 87 y.o. male who presents for an evaluation of atrial flutter/fibrillation and trifascicular block at the request of Nicholas Robbins. Their medical history includes OSA on CPAP, syncope, trifascicular block, NSVT, hypertension, hyperlipidemia.  The patient was last seen by Nicholas Robbins on Sep 13, 2020.  Nicholas Robbins is active 87 year old man who still works at a prior center in [employer].  He underwent cardioversion for persistent atrial fibrillation and flutter on June 19, 2020.   He tells me today very clearly that after his cardioversion he did not notice any difference in his exertional capacity.  He had no change in his energy level.  No presyncope or syncope while in A. fib.  No symptoms of heart failure while in A. fib.   Past Medical History:  Diagnosis Date   Arthritis    Asthma    Benign essential tremor    BPH (benign prostatic hyperplasia)    Central serous retinopathy    COAG (chronic open-angle glaucoma)    Colon polyps    Diverticulitis    Essential hypertension 06/22/2018   Glaucoma    Hypercholesterolemia 06/22/2018   Hyperlipidemia    Hypertension    Macular degeneration    Macular hole    NSVT (nonsustained ventricular tachycardia) (HCC) 06/22/2018   Event Monitor 30 days 05/10/2018: Performed for syncope Predominant rhythm is sinus rhythm with first-degree AV block. No symptoms reported. One 4 beat VT at 8:46PM, Occasional PVCs.   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     Past Surgical History:  Procedure Laterality Date   BACK SURGERY     lumbar fusion   broken ankle  1961   CARDIOVERSION N/A 06/19/2020   Procedure: CARDIOVERSION;  Surgeon: Nicholas Decamp, MD;  Location: [facility];  Service: Cardiovascular;  Laterality: N/A;   CARPAL TUNNEL RELEASE Right 10/15/2017   Procedure: RIGHT CARPAL TUNNEL RELEASE;  Surgeon: Cindee Salt, MD;  Location: [facility];  Service: Orthopedics;  Laterality: Right;   CATARACT EXTRACTION, BILATERAL     HERNIA REPAIR     REPLACEMENT TOTAL KNEE  2005   VITRECTOMY Left 2006    Current Medications: Current Meds  Medication Sig   acetaminophen (TYLENOL) 500 MG tablet Take 1,000 mg by mouth daily as needed (pain).   alfuzosin (UROXATRAL) 10 MG 24 hr tablet Take 10 mg by mouth daily.   apixaban (ELIQUIS) 5 MG TABS tablet Take 5 mg by mouth 2 (two) times daily.   Bioflavonoid Products (VITAMIN C) CHEW Chew 1 tablet by mouth daily. Gummy   carbidopa-levodopa (SINEMET IR) 25-100 MG tablet TAKE 1 TABLET BY MOUTH THREE TIMES A DAY   COD LIVER OIL PO Take 5 mLs by mouth daily.   ELDERBERRY PO Take 100 mg by mouth daily.   fexofenadine (ALLEGRA) 180 MG tablet Take 180 mg by mouth every evening.   finasteride (PROSCAR) 5 MG tablet Take 5 mg by mouth daily.   fluocinonide (  LIDEX) 0.05 % external solution Apply 1 application topically See admin instructions. Instil 2 gtts in ear twice weekly   folic acid (FOLVITE) 800 MCG tablet Take 800 mcg by mouth every evening.   Glucosamine-Chondroitin (COSAMIN DS PO) Take 1 tablet by mouth in the morning and at bedtime.   hyoscyamine (LEVSIN SL) 0.125 MG SL tablet Place 0.125 mg under the tongue as needed for cramping.    losartan-hydrochlorothiazide (HYZAAR) 50-12.5 MG tablet Take 1 tablet by mouth every morning.   modafinil (PROVIGIL) 100 MG tablet Take 1.5 tablets (150 mg total) by mouth daily. (Patient taking differently: Take 150 mg by mouth daily as needed (alertness with  driving).)   Multiple Vitamins-Minerals (PRESERVISION AREDS 2 PO) Take 1 tablet by mouth in the morning and at bedtime.   Nutritional Supplements (JUICE PLUS FIBRE PO) Take 2 tablets by mouth in the morning and at bedtime. Take 2 tablets Fruit Blend+ in the morning & take 2 tablets Vegetable Blend+ in the evening   pravastatin (PRAVACHOL) 40 MG tablet Take 40 mg by mouth at bedtime.   Probiotic Product (ALIGN PO) Take 1 capsule by mouth daily.   silodosin (RAPAFLO) 8 MG CAPS capsule Take 8 mg by mouth daily with breakfast.   Travoprost, BAK Free, (TRAVATAN) 0.004 % SOLN ophthalmic solution Place 1 drop into both eyes at bedtime.   vitamin B-12 (CYANOCOBALAMIN) 1000 MCG tablet Take 1,000 mcg by mouth daily.     Allergies:   Patient has no known allergies.   Social History   Socioeconomic History   Marital status: Single    Spouse name: Not on file   Number of children: 0   Years of education: Not on file   Highest education level: Not on file  Occupational History   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  Lives at home,  With [facility].  Works at [employer].  Lillette Boxer, post graduate.   Single, no kids.     Social Determinants of Health   Financial Resource Strain: Not on file  Food Insecurity: Not on file  Transportation Needs: Not on file  Physical Activity: Not on file  Stress: Not on file  Social Connections: Not on file     Family History: The patient's family history includes Breast cancer in his sister; Hypertension in his mother; Lymphoma in his brother; Stroke in his mother; Ulcers (age of onset: 31) in his father.  There is no history of Colon cancer, Stomach cancer, Rectal cancer, Esophageal cancer, or Liver cancer.  ROS:   Please see the history of present illness.    All other systems reviewed and are negative.  EKGs/Labs/Other Studies Reviewed:    The following studies were reviewed today:  Sep 13, 2020 EKG Atypical atrial flutter, right bundle branch block, left anterior fascicular block   Sep 19, 2019 EKG Sinus rhythm, right bundle branch block, left anterior fascicular block, first-degree AV delay     June 07, 2020 echo Normal left ventricular function, 53% Mildly dilated left atrium Moderately dilated right atrium Mild to moderate MR Moderate  TR    EKG:  The ekg ordered today demonstrates atrial fibrillation with a right bundle branch block and a left anterior fascicular block  Recent Labs: 09/19/2020: BNP 412.1; BUN 19; Creatinine, Ser 1.05; Hemoglobin 11.7; Platelets 171; Potassium 3.9; Sodium 136  Recent Lipid Panel No results found for: CHOL, TRIG, HDL, CHOLHDL, VLDL, LDLCALC, LDLDIRECT  Physical Exam:    VS:  BP 136/76   Pulse 91   Ht 5\' 5"  (1.651 m)   Wt 179 lb (81.2 kg)   BMI 29.79 kg/m     Wt Readings from Last 3 Encounters:  10/12/20 179 lb (81.2 kg)  09/18/20 181 lb (82.1 kg)  09/13/20 181 lb 12.8 oz (82.5 kg)     GEN:  Well nourished, well developed in no acute distress.  Using cane for stability HEENT: Normal NECK: No JVD; No carotid bruits LYMPHATICS: No lymphadenopathy CARDIAC: Irregularly irregular, no murmurs, rubs, gallops RESPIRATORY:  Clear to auscultation without rales, wheezing or rhonchi  ABDOMEN: Soft, non-tender, non-distended MUSCULOSKELETAL:  No edema; No deformity  SKIN: Warm and dry NEUROLOGIC:  Alert and oriented x 3 PSYCHIATRIC:  Normal affect   ASSESSMENT:    1. Persistent atrial fibrillation (HCC)   2. Atypical atrial flutter (HCC)   3. Trifascicular block   4. Essential hypertension    PLAN:    In order of  problems listed above:  Persistent atrial fibrillation Suspect he is actually in permanent atrial fibrillation.  On Eliquis for stroke prophylaxis.  The patient is asymptomatic while in atrial fibrillation.  There are no signs of heart failure while in atrial fibrillation.  I asked him multiple times in different ways whether or not he had symptoms and he is fairly steady in his responses that he cannot tell when he is in or out of rhythm.  He is still very active even while in atrial fibrillation.  I discussed the possible treatment options for his atrial fibrillation including antiarrhythmic drugs and he is reluctant to take any because of the potential off target effects.  I am concerned that with any antiarrhythmic drug he may experience more symptoms than he is having now while in a rate controlled A. fib.  For now, I have recommended that we continue Eliquis for stroke prophylaxis.  If, in the future, he develops symptoms related to atrial fibrillation and a rhythm control strategy is warranted, could consider amiodarone.  I would do a very slow load of the amiodarone and frequent checks of his EKG to monitor for any progressive conduction system disease.  Follow-up as needed.  2.  Trifascicular block Discussed pathophysiology with the patient during today's visit and its relationship to antiarrhythmic drug therapy.  3.  Hypertension Controlled Continue losartan, hydrochlorothiazide    Medication Adjustments/Labs and Tests Ordered: Current medicines are reviewed at length with the patient today.  Concerns regarding medicines are outlined above.  Orders Placed This Encounter  Procedures   EKG 12-Lead   No orders of the defined types were placed in this encounter.    Signed, 09/15/20. Rossie Muskrat, MD, [facility], [facility] 10/12/2020 12:02 PM    Electrophysiology [facility]

## 2020-10-11 NOTE — Telephone Encounter (Signed)
Patient returned my call. We scheduled injection for 7/6 with Dr. Terrace Arabia. I discussed use of specialty pharmacy with him. He verbalized understanding. I advised that I will reach back out to him when [facility] is ready to schedule delivery of Botox.

## 2020-10-12 ENCOUNTER — Other Ambulatory Visit: Payer: Self-pay

## 2020-10-12 ENCOUNTER — Ambulatory Visit: Payer: Medicare Other | Admitting: Cardiology

## 2020-10-12 ENCOUNTER — Encounter: Payer: Self-pay | Admitting: Cardiology

## 2020-10-12 VITALS — BP 136/76 | HR 91 | Ht 65.0 in | Wt 179.0 lb

## 2020-10-12 DIAGNOSIS — I484 Atypical atrial flutter: Secondary | ICD-10-CM

## 2020-10-12 DIAGNOSIS — I453 Trifascicular block: Secondary | ICD-10-CM

## 2020-10-12 DIAGNOSIS — I4819 Other persistent atrial fibrillation: Secondary | ICD-10-CM

## 2020-10-12 DIAGNOSIS — I10 Essential (primary) hypertension: Secondary | ICD-10-CM

## 2020-10-12 NOTE — Patient Instructions (Signed)
Medication Instructions:  Your physician recommends that you continue on your current medications as directed. Please refer to the Current Medication list given to you today.  *If you need a refill on your cardiac medications before your next appointment, please call your pharmacy*   Lab Work: None  If you have labs (blood work) drawn today and your tests are completely normal, you will receive your results only by: MyChart Message (if you have MyChart) OR A paper copy in the mail If you have any lab test that is abnormal or we need to change your treatment, we will call you to review the results.   Testing/Procedures: None   Follow-Up: As needed

## 2020-10-18 NOTE — Telephone Encounter (Signed)
Spoke with patient, he called pharmacy and gave consent. I called [employer], Botox TBD 6/28.

## 2020-10-22 ENCOUNTER — Ambulatory Visit: Payer: Medicare Other | Admitting: Cardiology

## 2020-10-22 ENCOUNTER — Encounter: Payer: Self-pay | Admitting: Cardiology

## 2020-10-22 ENCOUNTER — Other Ambulatory Visit: Payer: Self-pay

## 2020-10-22 VITALS — BP 110/69 | HR 75 | Temp 98.3°F | Resp 17 | Ht 65.0 in | Wt 180.4 lb

## 2020-10-22 DIAGNOSIS — I484 Atypical atrial flutter: Secondary | ICD-10-CM

## 2020-10-22 DIAGNOSIS — I5032 Chronic diastolic (congestive) heart failure: Secondary | ICD-10-CM

## 2020-10-22 DIAGNOSIS — I10 Essential (primary) hypertension: Secondary | ICD-10-CM

## 2020-10-22 DIAGNOSIS — I453 Trifascicular block: Secondary | ICD-10-CM

## 2020-10-22 NOTE — Progress Notes (Signed)
Primary Physician/Referring:  Rodrigo Ran, MD  Patient ID: Nicholas Robbins, male    DOB: [birth year], 87 y.o.   MRN: [id number]  Chief Complaint  Patient presents with   Atrial Flutter    6 WEEKS   HPI:    Nicholas Robbins  is a 87 y.o. male who is very active and continues to live independently and works at [employer]. 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 Event monitor in January 2020 did not reveal any high degree AV block but had 4 beat NSVT, nuclear stress test on 08/20/2018 was nonischemic.  Here developed acute decompensated diastolic heart failure due to persistent atrial fibrillation and underwent cardioversion on 06/19/2020 but was back in atypical atrial flutter.  I did get an opinion from Dr. Steffanie Dunn, EP who at that time felt as patient symptoms are stabilized his fatigue is improved and heart failure symptoms have been controlled on medical therapy and in view of advanced age and patient's minimal symptoms to continue medical therapy.  With regard to chronic diastolic heart failure, he has lost close to about 15 to 20 pounds in weight with marked improvement in leg edema.  No dizziness or syncope.   Past Medical History:  Diagnosis Date   Arthritis    Asthma    Benign essential tremor    BPH (benign prostatic hyperplasia)    Central serous retinopathy    COAG (chronic open-angle glaucoma)    Colon polyps    Diverticulitis    Essential hypertension 06/22/2018   Glaucoma    Hypercholesterolemia 06/22/2018   Hyperlipidemia    Hypertension    Macular degeneration    Macular hole    NSVT (nonsustained ventricular tachycardia) (HCC) 06/22/2018   Event Monitor 30 days 05/10/2018: Performed for syncope Predominant rhythm is sinus rhythm with first-degree AV block. No symptoms reported. One 4 beat VT at 8:46PM,  Occasional PVCs.   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   Past Surgical History:  Procedure Laterality Date   BACK SURGERY     lumbar fusion   broken ankle  1961   CARDIOVERSION N/A 06/19/2020   Procedure: CARDIOVERSION;  Surgeon: Yates Decamp, MD;  Location: [facility] ENDOSCOPY;  Service: Cardiovascular;  Laterality: N/A;   CARPAL TUNNEL RELEASE Right 10/15/2017   Procedure: RIGHT CARPAL TUNNEL RELEASE;  Surgeon: Cindee Salt, MD;  Location: [facility];  Service: Orthopedics;  Laterality: Right;   CATARACT EXTRACTION, BILATERAL     HERNIA REPAIR     REPLACEMENT TOTAL KNEE  2005   VITRECTOMY Left 2006   Family History  Problem Relation Age of Onset   Hypertension Mother    Stroke Mother    Ulcers Father 48       bleeding/ deceased   Lymphoma Brother        cancer   Breast cancer Sister    Colon cancer Neg Hx    Stomach cancer Neg Hx    Rectal cancer Neg Hx    Esophageal cancer Neg Hx    Liver cancer Neg Hx     Social History   Tobacco Use   Smoking status: Never   Smokeless tobacco: Never  Substance  Use Topics   Alcohol use: Yes    Alcohol/week: 0.0 standard drinks    Comment: occasional   Marital Status: Single  ROS  Review of Systems  Constitutional: Negative for malaise/fatigue.  Cardiovascular:  Positive for dyspnea on exertion (Stable) and leg swelling (Very mild). Negative for syncope.  Musculoskeletal:  Positive for back pain.  Gastrointestinal:  Negative for melena.  Neurological:  Positive for tremors.  Objective  Blood pressure 110/69, pulse 75, temperature 98.3 F (36.8 C), temperature source Temporal, resp. rate 17, height 5\' 5"  (1.651 m), weight 180 lb 6.4 oz (81.8 kg), SpO2 98 %.  Vitals with BMI 10/22/2020 10/12/2020 09/18/2020  Height 5\' 5"  5\' 5"  5\' 8"   Weight 180 lbs 6 oz 179 lbs 181 lbs  BMI 30.02 29.79 27.53  Systolic 110 136  117  Diastolic 69 76 67  Pulse 75 91 58     Physical Exam Constitutional:      General: He is not in acute distress.    Appearance: He is well-developed.     Comments: Mildly oebese  Neck:     Vascular: No carotid bruit or JVD.  Cardiovascular:     Rate and Rhythm: Normal rate and regular rhythm.     Pulses: Intact distal pulses.     Heart sounds: No murmur heard.   No gallop.     Comments: =  Pulmonary:     Effort: Pulmonary effort is normal. No accessory muscle usage.     Breath sounds: Normal breath sounds.  Abdominal:     General: Bowel sounds are normal.     Palpations: Abdomen is soft.  Musculoskeletal:     Right lower leg: Edema (2 +) present.     Left lower leg: Edema (1-2+ below-knee pitting edema) present.  Skin:    General: Skin is warm.     Capillary Refill: Capillary refill takes less than 2 seconds.   Laboratory examination:    CMP Latest Ref Rng & Units 09/19/2020 06/15/2020 05/30/2020  Glucose 65 - 99 mg/dL ) 99 09/21/2020)  BUN 8 - 27 mg/dL 19 14 16   Creatinine 0.76 - 1.27 mg/dL 06/17/2020 07/28/2020 263(Z  Sodium 134 - 144 mmol/L 136 138 138  Potassium 3.5 - 5.2 mmol/L 3.9 4.3 4.1  Chloride 96 - 106 mmol/L 99 102 100  CO2 20 - 29 mmol/L 21 20 23   Calcium 8.6 - 10.2 mg/dL 9.3 8.9 858(I)    External labs:    Lab 05/22/2020:  Hb 11.6/HCT 36.7, platelets not reported.  Normal indicis. Cholesterol, total 159.000 m 11/03/2019 HDL 53 MG/DL 7.74 LDL 1.28 mg 11/03/2019 Triglycerides 106.000 11/03/2019  Hemoglobin 12.400 g/d 11/03/2019  Creatinine, Serum 1.100 mg/ 11/03/2019 ALT (SGPT) 12.000 uni 11/03/2019 TSH 2.370 11/03/2019  Na/K 139/4.7  07/14/2019  Cholesterol, total 178.000 07/14/2019 Triglycerides 251.000 07/14/2019 HDL 39.000 07/14/2019 LDL-C 96.000 07/14/2019  BUN 22.000 07/14/2019 Creatinine, Serum 1.210 07/14/2019  A1C 5.700 07/14/2019  Glucose Random 117.000 07/14/2019  MicroAlbumin Urine 23.950 09/29/2018 MicroAlbumin/Creat 208.300 09/29/2018  PSA 7.850  06/17/2016  Medications and allergies  No Known Allergies   Current Outpatient Medications on File Prior to Visit  Medication Sig Dispense Refill   acetaminophen (TYLENOL) 500 MG tablet Take 1,000 mg by mouth daily as needed (pain).     alfuzosin (UROXATRAL) 10 MG 24 hr tablet Take 10 mg by mouth daily.     apixaban (ELIQUIS) 5 MG TABS tablet Take 5 mg by mouth 2 (two) times daily.     Bioflavonoid  Products (VITAMIN C) CHEW Chew 1 tablet by mouth daily. Gummy     BOTOX 100 units SOLR injection      carbidopa-levodopa (SINEMET IR) 25-100 MG tablet TAKE 1 TABLET BY MOUTH THREE TIMES A DAY 270 tablet 1   COD LIVER OIL PO Take 5 mLs by mouth daily.     ELDERBERRY PO Take 100 mg by mouth daily.     fexofenadine (ALLEGRA) 180 MG tablet Take 180 mg by mouth every evening.     finasteride (PROSCAR) 5 MG tablet Take 5 mg by mouth daily.  3   fluocinonide (LIDEX) 0.05 % external solution Apply 1 application topically See admin instructions. Instil 2 gtts in ear twice weekly     folic acid (FOLVITE) 800 MCG tablet Take 800 mcg by mouth every evening.     Glucosamine-Chondroitin (COSAMIN DS PO) Take 1 tablet by mouth in the morning and at bedtime.     hyoscyamine (LEVSIN SL) 0.125 MG SL tablet Place 0.125 mg under the tongue as needed for cramping.      losartan-hydrochlorothiazide (HYZAAR) 50-12.5 MG tablet Take 1 tablet by mouth every morning. 30 tablet 2   modafinil (PROVIGIL) 100 MG tablet Take 1.5 tablets (150 mg total) by mouth daily. (Patient taking differently: Take 150 mg by mouth daily as needed (alertness with driving).) 45 tablet 3   Multiple Vitamins-Minerals (PRESERVISION AREDS 2 PO) Take 1 tablet by mouth in the morning and at bedtime.     pravastatin (PRAVACHOL) 40 MG tablet Take 40 mg by mouth at bedtime.  2   Probiotic Product (ALIGN PO) Take 1 capsule by mouth daily.     silodosin (RAPAFLO) 8 MG CAPS capsule Take 8 mg by mouth daily with breakfast.     Travoprost, BAK Free,  (TRAVATAN) 0.004 % SOLN ophthalmic solution Place 1 drop into both eyes at bedtime.     vitamin B-12 (CYANOCOBALAMIN) 1000 MCG tablet Take 1,000 mcg by mouth daily.     No current facility-administered medications on file prior to visit.    Radiology:   Chest x-ray PA and lateral view 05/28/2020:  Heart size is normal, persistent bilateral pleural effusion at the lung bases compared to 05/22/2020 no change. Mild thoracic spondylosis.  Hyperinflation. Impression: Pneumonia versus minimal congestive heart failure, hyperinflation consistent with COPD.  Cardiac Studies:   Sleep study 03/31/2014 Positive for Sleep Apnea, follows Dr. Casilda Carls myoview 06/16/2014: SPECT images demonstrate a moderate-sized mild ischemia extending from the base towards the apex in the septal wall. Left ventricle systolic function was Calculated at 50%. There was mild septal hypokinesis. This represents an intermediate risk scan.  Event Monitor 30 days 05/10/2018:  Predominant rhythm is sinus rhythm with first-degree AV block. No symptoms reported. One 4 beat VT at 8:46PM, Occasional PVCs.  Lexiscan myoview stress test 06/21/2018:  1. Lexiscan stress test was performed. Exercise capacity was not assessed. No stress symptoms reported. Resting blood pressure was 136/72 mmHg and peak effect blood pressure was 140/70 mmHg. The resting and stress electrocardiogram demonstrated normal sinus rhythm, RBBB + LAHB, occasional PVC and normal rest repolarization.  Stress EKG is non diagnostic for ischemia as it is a pharmacologic stress.  2. The overall quality of the study is good. There is no evidence of abnormal lung activity. Stress and rest SPECT images demonstrate homogeneous tracer distribution throughout the myocardium. Gated SPECT imaging reveals mild global decrease in myocardial thickening and wall motion. The left ventricular ejection fraction was normal (46%).  3. High risk study due to reduced LVEF. No  evidence of ischemia/ infarction.  Echocardiogram 06/07/2020:   1. Left ventricle cavity is normal in size and wall thickness. Abnormal septal wall motion due to left bundle branch block. Normal LV systolic function with EF 53%. Unable to evaluate diastolic function due to atrial fibrillation. Calculated EF 53%. 2. Left atrial cavity is mildly dilated. 3. Right atrial cavity is moderately dilated. 4. Mild to moderate mitral regurgitation. 5. Moderate tricuspid regurgitation. Estimated pulmonary artery systolic pressure 38 mmHg. 6. Insignificant pericardial effusion. 7. Compared to previous study in 2020, mild PH, pericardial effusion are new. Tricuspid regurgitation has increased from trace to moderate.  EKG  EKG 10/22/2020: Atypical atrial flutter with 2: 1 conduction, ventricular rate 75 bpm, inferior infarct old, right bundle branch block.  Anterolateral infarct old.  Low-voltage complexes. No significant change from EKG 09/13/2020  EKG 06/15/2020: Atrial fibrillation with controlled ventricular response at the rate of 94 bpm, left axis deviation, left anterior fascicular block.  Right bundle branch block.  Poor R wave progression, cannot exclude anterolateral infarct old.  Normal QT interval.  Compared to 05/31/2019, atypical atrial flutter.  Otherwise no significant change.   EKG 04/03/2020: Sinus rhythm with first-degree block at rate of 77 bpm, left axis deviation, left intrafascicular block.  Right bundle branch block.  Trifascicular block.  Poor R wave progression, cannot exclude anterolateral infarct old.  Unspecific T abnormality.   No significant change from EKG 09/19/2019,  03/17/2019.   Assessment     ICD-10-CM   1. Atypical atrial flutter (HCC)  I48.4 EKG 12-Lead    2. Trifascicular block  I45.3     3. Essential hypertension  I10     4. Chronic diastolic (congestive) heart failure (HCC)  I50.32       CHA2DS2-VASc Score is 4.  Yearly risk of stroke: 4.8% (A, HTN, Coronary  atherosclerosis).  Score of 1=0.6; 2=2.2; 3=3.2; 4=4.8; 5=7.2; 6=9.8; 7=>9.8) -(CHF; HTN; vasc disease DM,  Male = 1; Age <65 =0; 65-74 = 1,  >75 =2; stroke/embolism= 2).    No orders of the defined types were placed in this encounter.   Medications Discontinued During This Encounter  Medication Reason   Nutritional Supplements (JUICE PLUS FIBRE PO) Error    Recommendations:   Nicholas Robbins is a fairly active 87 y.o. male who is very active and continues to live independently and works at [employer]. 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 Event monitor in January 2020 did not reveal any high degree AV block but had 4 beat NSVT, nuclear stress test on 08/20/2018 was nonischemic.  Here developed acute decompensated diastolic heart failure due to persistent atrial fibrillation and underwent cardioversion on 06/19/2020 but was back in atypical atrial flutter.  I did get an opinion from Dr. Steffanie Dunn, EP who at that time felt as patient symptoms are stabilized his fatigue is improved and heart failure symptoms have been controlled on medical therapy and in view of advanced age and patient's minimal symptoms to continue medical therapy.  Patient appears to have stabilized with regard to atrial flutter and he is now back to his baseline.  He has very minimal leg edema, no clinical evidence of acute decompensated heart failure and his energy levels are back to baseline.  Mild dyspnea is also at  baseline.  I would like to see him back in 3 months for follow-up.   Patient will be screened into LUX-Sx TRENDS study (loop implantation for heart failure sensor).  Prior to development of a flutter, he had underlying trifascicular block.  He may have the benefit of having an implantable loop with regard to arrhythmia management.  He is  enrolled in RPM and PCM due to chronic diastolic heart failure.  I started him on losartan HCT 50/12.5 mg in the morning, will change his Lasix to be taken on a as needed basis.  We will obtain a BMP, BNP and CBC in 2 weeks from now.  I would like to see him back in 4 to 6 weeks for follow-up.       Yates DecampJay Khaleb Broz, MD, [facility] West BranchFACC 10/22/2020, 10:44 PM Office: [phone] Pager: [phone]

## 2020-10-23 NOTE — Telephone Encounter (Signed)
Received (1) 100 unit vial of Botox today from [facility]. 

## 2020-10-31 ENCOUNTER — Encounter: Payer: Self-pay | Admitting: Neurology

## 2020-10-31 ENCOUNTER — Encounter: Payer: Self-pay | Admitting: *Deleted

## 2020-10-31 ENCOUNTER — Ambulatory Visit: Payer: Medicare Other | Admitting: Neurology

## 2020-10-31 VITALS — BP 112/80 | HR 75 | Ht 65.0 in | Wt 182.4 lb

## 2020-10-31 DIAGNOSIS — G20 Parkinson's disease: Secondary | ICD-10-CM | POA: Diagnosis not present

## 2020-10-31 DIAGNOSIS — K117 Disturbances of salivary secretion: Secondary | ICD-10-CM | POA: Insufficient documentation

## 2020-10-31 DIAGNOSIS — R269 Unspecified abnormalities of gait and mobility: Secondary | ICD-10-CM | POA: Diagnosis not present

## 2020-10-31 NOTE — Progress Notes (Signed)
**  Botox 100 units x 1 vial, NDC 0175-1025-85, Lot [id number], Exp 09/2022, specialty pharmacy.//mck,rn**

## 2020-10-31 NOTE — Telephone Encounter (Signed)
Patient signed refill request form for [facility] today during appointment. This is to have his next Botox shipment delivered on 10/5. I faxed this to [facility].

## 2020-10-31 NOTE — Progress Notes (Signed)
Chief Complaint  Patient presents with   Procedure    Botox      ASSESSMENT AND PLAN  Nicholas Robbins is a 87 y.o. male   Excessive sialorrhea  I have suggested him to drink small sips of water frequently, suck on hard candy to stimulate swallowing reflex  Botox 100 units today, dissolved into 2 cc of normal saline,  50 units each side, dosage was divided along parotid gland and submandibular gland with multiple injection sites   Idiopathic Parkinson's disease  He has not been persistent with his Sinemet 25/100 mg 3 times daily, likely he will do better taking his medicine more regularly, 6 AM, 10 AM, 2 PM  Gait abnormalities  Multifactorial, Parkinson's disease, significant scoliosis, joints pain   DIAGNOSTIC DATA (LABS, IMAGING, TESTING) - I reviewed patient records, labs, notes, testing and imaging myself where available.   HISTORICAL  Nicholas Robbins, is a 87 year old male, seen in request by nurse practitioner Everlene Other for evaluation of Botox injection to control his excessive sialorrhea, his primary care physician is Dr. Rodrigo Ran, I saw him initially on October 31, 2020  I reviewed and summarized the referring note.PMHX.  HTN HLD. Atrial Fibrillation, on Eliquis 5mg  bid, see Dr. Parkinson's disease  Using CPAP, good compliance  He is a retired [employer] Halim, lives at retreatment center in [facility], drove himself to clinic today,  He was diagnosed with Parkinson's disease around 2018, presented with left hand tremor, difficulty writing, voice change, increased gait abnormality, he was started on Sinemet 25/100 mg, reported missing his second dose about 50% of the time  He has very regular daily routine, get up at 5:15 AM, have breakfast at 8 AM, lunch at 12, go to bed at 10:15 PM, he tried to take his medications 2 hours before each meal, supposed to take his Sinemet 25/100 at 6 AM, 10 AM, 2 PM, but he often got so busy in the middle of  the day, missing his 10 AM dose, he did not notice any significant difference of his left hand tremor taking or not taking Sinemet  He has significant gait abnormality using walker for longer distances, cane for short distance, due to scoliosis and Parkinson's  He began to noticed excessive drooling since 2021, he described it is less bothersome if he is talking or eating, but when he is relaxing, he has to hold his handkerchief all the time, oftentimes woke up from sleep felt his mouth is full of saliva, he denies dysphagia, no choking episode, sleep well, no significant bowel bladder difficulties.   PHYSICAL EXAM:   Vitals:   10/31/20 1430  BP: 112/80  Pulse: 75  Weight: 182 lb 6.4 oz (82.7 kg)  Height: 5\' 5"  (1.651 m)   Not recorded     Body mass index is 30.35 kg/m.  PHYSICAL EXAMNIATION:  Gen: NAD, conversant, well nourised, well groomed                     Cardiovascular: Regular rate rhythm, no peripheral edema, warm, nontender. Eyes: Conjunctivae clear without exudates or hemorrhage Neck: Supple, no carotid bruits. Pulmonary: Clear to auscultation bilaterally   NEUROLOGICAL EXAM:  MENTAL STATUS: Speech:    Speech is normal; fluent and spontaneous with normal comprehension.  Cognition:     Orientation to time, place and person     Normal recent and remote memory     Normal Attention span and concentration  Normal Language, naming, repeating,spontaneous speech     Fund of knowledge   CRANIAL NERVES: CN II: Visual fields are full to confrontation. Pupils are round equal and briskly reactive to light. CN III, IV, VI: extraocular movement are normal. No ptosis. CN V: Facial sensation is intact to light touch CN VII: Face is symmetric with normal eye closure  CN VIII: Hearing is normal to causal conversation. CN IX, X: Phonation is normal. CN XI: Head turning and shoulder shrug are intact  MOTOR: There is no pronator drift of out-stretched arms. Muscle bulk  and tone are normal. Muscle strength is normal.  REFLEXES: Reflexes are 2+ and symmetric at the biceps, triceps, knees, and ankles. Plantar responses are flexor.  SENSORY: Intact to light touch, pinprick and vibratory sensation are intact in fingers and toes.  COORDINATION: There is no trunk or limb dysmetria noted.  GAIT/STANCE: Posture is normal. Gait is steady with normal steps, base, arm swing, and turning. Heel and toe walking are normal. Tandem gait is normal.  Romberg is absent.  REVIEW OF SYSTEMS:  Full 14 system review of systems performed and notable only for as above All other review of systems were negative.   ALLERGIES: No Known Allergies  HOME MEDICATIONS: Current Outpatient Medications  Medication Sig Dispense Refill   acetaminophen (TYLENOL) 500 MG tablet Take 1,000 mg by mouth daily as needed (pain).     alfuzosin (UROXATRAL) 10 MG 24 hr tablet Take 10 mg by mouth daily.     apixaban (ELIQUIS) 5 MG TABS tablet Take 5 mg by mouth 2 (two) times daily.     Bioflavonoid Products (VITAMIN C) CHEW Chew 1 tablet by mouth daily. Gummy     BOTOX 100 units SOLR injection      carbidopa-levodopa (SINEMET IR) 25-100 MG tablet TAKE 1 TABLET BY MOUTH THREE TIMES A DAY 270 tablet 1   COD LIVER OIL PO Take 5 mLs by mouth daily.     ELDERBERRY PO Take 100 mg by mouth daily.     fexofenadine (ALLEGRA) 180 MG tablet Take 180 mg by mouth every evening.     finasteride (PROSCAR) 5 MG tablet Take 5 mg by mouth daily.  3   fluocinonide (LIDEX) 0.05 % external solution Apply 1 application topically See admin instructions. Instil 2 gtts in ear twice weekly     folic acid (FOLVITE) 800 MCG tablet Take 800 mcg by mouth every evening.     Glucosamine-Chondroitin (COSAMIN DS PO) Take 1 tablet by mouth in the morning and at bedtime.     hyoscyamine (LEVSIN SL) 0.125 MG SL tablet Place 0.125 mg under the tongue as needed for cramping.      losartan-hydrochlorothiazide (HYZAAR) 50-12.5 MG  tablet Take 1 tablet by mouth every morning. 30 tablet 2   modafinil (PROVIGIL) 100 MG tablet Take 1.5 tablets (150 mg total) by mouth daily. (Patient taking differently: Take 150 mg by mouth daily as needed (alertness with driving).) 45 tablet 3   Multiple Vitamins-Minerals (PRESERVISION AREDS 2 PO) Take 1 tablet by mouth in the morning and at bedtime.     pravastatin (PRAVACHOL) 40 MG tablet Take 40 mg by mouth at bedtime.  2   Probiotic Product (ALIGN PO) Take 1 capsule by mouth daily.     silodosin (RAPAFLO) 8 MG CAPS capsule Take 8 mg by mouth daily with breakfast.     Travoprost, BAK Free, (TRAVATAN) 0.004 % SOLN ophthalmic solution Place 1 drop into both eyes at bedtime.  vitamin B-12 (CYANOCOBALAMIN) 1000 MCG tablet Take 1,000 mcg by mouth daily.     No current facility-administered medications for this visit.    PAST MEDICAL HISTORY: Past Medical History:  Diagnosis Date   Arthritis    Asthma    Benign essential tremor    BPH (benign prostatic hyperplasia)    Central serous retinopathy    COAG (chronic open-angle glaucoma)    Colon polyps    Diverticulitis    Essential hypertension 06/22/2018   Glaucoma    Hypercholesterolemia 06/22/2018   Hyperlipidemia    Hypertension    Macular degeneration    Macular hole    NSVT (nonsustained ventricular tachycardia) (HCC) 06/22/2018   Event Monitor 30 days 05/10/2018: Performed for syncope Predominant rhythm is sinus rhythm with first-degree AV block. No symptoms reported. One 4 beat VT at 8:46PM, Occasional PVCs.   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    PAST SURGICAL HISTORY: Past Surgical History:  Procedure Laterality Date   BACK SURGERY     lumbar fusion   broken ankle  1961   CARDIOVERSION N/A 06/19/2020   Procedure: CARDIOVERSION;  Surgeon: Yates DecampGanji, Jay, MD;  [facility];  Service: Cardiovascular;   Laterality: N/A;   CARPAL TUNNEL RELEASE Right 10/15/2017   Procedure: RIGHT CARPAL TUNNEL RELEASE;  Surgeon: Cindee SaltKuzma, Gary, MD;  Location: [facility];  Service: Orthopedics;  Laterality: Right;   CATARACT EXTRACTION, BILATERAL     HERNIA REPAIR     REPLACEMENT TOTAL KNEE  2005   VITRECTOMY Left 2006    FAMILY HISTORY: Family History  Problem Relation Age of Onset   Hypertension Mother    Stroke Mother    Ulcers Father 4885       bleeding/ deceased   Lymphoma Brother        cancer   Breast cancer Sister    Colon cancer Neg Hx    Stomach cancer Neg Hx    Rectal cancer Neg Hx    Esophageal cancer Neg Hx    Liver cancer Neg Hx     SOCIAL HISTORY: Social History   Socioeconomic History   Marital status: Single    Spouse name: Not on file   Number of children: 0   Years of education: Not on file   Highest education level: Not on file  Occupational History   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  Lives at home,  With [facility].  Works at [employer].  [employer], post graduate.   Single, no kids.     Social Determinants of Health   Financial Resource Strain: Not on file  Food Insecurity: Not on file  Transportation Needs: Not on file  Physical Activity: Not on file  Stress: Not on file  Social Connections: Not on file  Intimate Partner Violence: Not on file      Levert FeinsteinYijun Avaree Gilberti, M.D. Ph.D.  [facility] [address], [address] [place], [place] [address] Ph: [phone] Fax: [phone]  CC:  Rodrigo Ran, MD [address] [place],  [place] [address]

## 2020-11-01 DIAGNOSIS — R269 Unspecified abnormalities of gait and mobility: Secondary | ICD-10-CM

## 2020-11-01 HISTORY — DX: Unspecified abnormalities of gait and mobility: R26.9

## 2020-11-20 ENCOUNTER — Other Ambulatory Visit: Payer: Self-pay

## 2020-11-20 DIAGNOSIS — I5032 Chronic diastolic (congestive) heart failure: Secondary | ICD-10-CM

## 2020-11-20 MED ORDER — LOSARTAN POTASSIUM-HCTZ 50-12.5 MG PO TABS: 1.0000 | ORAL_TABLET | Freq: Every morning | ORAL | 1 refills | Status: DC

## 2020-12-02 NOTE — Telephone Encounter (Signed)
Please remind me of his 89th B-day coming up ! CD

## 2020-12-04 ENCOUNTER — Institutional Professional Consult (permissible substitution): Payer: Medicare Other | Admitting: Cardiology

## 2020-12-05 ENCOUNTER — Other Ambulatory Visit: Payer: Self-pay

## 2020-12-05 ENCOUNTER — Encounter: Payer: Medicare Other | Admitting: Cardiology

## 2020-12-05 DIAGNOSIS — Z006 Encounter for examination for normal comparison and control in clinical research program: Secondary | ICD-10-CM

## 2020-12-05 DIAGNOSIS — I5032 Chronic diastolic (congestive) heart failure: Secondary | ICD-10-CM

## 2020-12-05 NOTE — Progress Notes (Signed)
Chief Complaint  Patient presents with   Research visit    Screening visit     ICD-10-CM   1. Research exam Lux-Dx TRENDS implantable loop for CHF  Z00.6     2. Chronic diastolic (congestive) heart failure (HCC)  I50.32      Patient will be screened into LUX-Sx TRENDS study (loop implantation for heart failure sensor).    Nicholas Decamp, MD, [facility] 12/05/2020, 2:32 PM Office: [phone] Fax: [phone] Pager: [phone]

## 2020-12-28 ENCOUNTER — Other Ambulatory Visit: Payer: Self-pay | Admitting: Adult Health

## 2021-01-01 NOTE — Telephone Encounter (Signed)
Per [place], last filled 45 tablets on 04/28/2020. Pt takes 1.5 tablets daily prn. Last saw MM NP in May 2022. Refill sent to MM NP to e-scribe.

## 2021-01-03 ENCOUNTER — Encounter: Payer: Self-pay | Admitting: Cardiology

## 2021-01-03 ENCOUNTER — Other Ambulatory Visit: Payer: Self-pay

## 2021-01-03 ENCOUNTER — Other Ambulatory Visit: Payer: Self-pay | Admitting: Cardiology

## 2021-01-03 VITALS — BP 124/80 | HR 74 | Temp 98.1°F

## 2021-01-03 DIAGNOSIS — I5032 Chronic diastolic (congestive) heart failure: Secondary | ICD-10-CM

## 2021-01-03 DIAGNOSIS — Z006 Encounter for examination for normal comparison and control in clinical research program: Secondary | ICD-10-CM

## 2021-01-03 NOTE — Progress Notes (Signed)
Chief Complaint  Patient presents with   Research    LUX-Sx TRENDS study (loop implantation for heart failure sensor).    Vitals:   01/03/21 1219  BP: 124/80  Pulse: 74  Temp: 98.1 F (36.7 C)   Physical Exam Cardiovascular:     Rate and Rhythm: Rhythm irregular.     Pulses: Normal pulses.  Pulmonary:     Effort: Pulmonary effort is normal. No respiratory distress.     Breath sounds: Normal breath sounds.  Abdominal:     General: Abdomen is flat.     Palpations: Abdomen is soft.  Neurological:     Mental Status: He is alert.     Prodedure performed: Insertion of Environmental manager Lux-Dx [employer]. Serial # [id number]   Indication: Enrolled in clinical trial of device: LUX-Sx TRENDS study (loop implantation for heart failure sensor).  01/03/2021  Blood pressure 124/80, pulse 74, temperature 98.1 F (36.7 C).   After obtaining informed consent, explaining the procedure to the patient, under sterile precautions, local anesthesia with 1% lidocaine with epinephrine was injected subcutaneously in the left parasternal region.  20 mL utilized.  A small nick was made in the left paraspinal region at the 3rd intercostal space. [employer] Lux-Dx loop recorder was inserted without any complications.  Patient tolerated the procedure well.  The incision was closed with Steri-Strips.  There is no blood loss, no hematoma.  Written instrtuctions regarding wound care given to the patient.  Device setting:  R wave amplitude 0.21mV.   Programmed:  AF detection to Off. HVR 170/min. Bradycardia 40 BPM Asystole 4.5 Sec Patient triggered: On     ICD-10-CM   1. Enrolled in clinical trial of device: LUX-Sx TRENDS study (loop implantation for heart failure sensor).  01/03/2021  Z00.6     2. Chronic diastolic (congestive) heart failure (HCC)  I50.32 Pro b natriuretic peptide (BNP)       Yates Decamp, MD, [facility] 01/03/2021, 12:24 PM Office: [phone] Fax: [phone] Pager:  [phone]

## 2021-01-04 LAB — PRO B NATRIURETIC PEPTIDE: NT-Pro BNP: 2890 pg/mL — ABNORMAL HIGH (ref 0–486)

## 2021-01-13 ENCOUNTER — Inpatient Hospital Stay (HOSPITAL_COMMUNITY)
Admission: EM | Admit: 2021-01-13 | Discharge: 2021-01-16 | DRG: 242 | Disposition: A | Discharge status: Home / Self Care | Payer: Medicare Other | Attending: Cardiology | Admitting: Cardiology

## 2021-01-13 ENCOUNTER — Encounter (HOSPITAL_COMMUNITY): Payer: Self-pay | Admitting: *Deleted

## 2021-01-13 ENCOUNTER — Emergency Department (HOSPITAL_COMMUNITY): Payer: Medicare Other

## 2021-01-13 DIAGNOSIS — N400 Enlarged prostate without lower urinary tract symptoms: Secondary | ICD-10-CM | POA: Diagnosis present

## 2021-01-13 DIAGNOSIS — I442 Atrioventricular block, complete: Principal | ICD-10-CM | POA: Diagnosis present

## 2021-01-13 DIAGNOSIS — Z8249 Family history of ischemic heart disease and other diseases of the circulatory system: Secondary | ICD-10-CM

## 2021-01-13 DIAGNOSIS — H35719 Central serous chorioretinopathy, unspecified eye: Secondary | ICD-10-CM | POA: Diagnosis present

## 2021-01-13 DIAGNOSIS — R001 Bradycardia, unspecified: Secondary | ICD-10-CM

## 2021-01-13 DIAGNOSIS — R2681 Unsteadiness on feet: Secondary | ICD-10-CM | POA: Diagnosis not present

## 2021-01-13 DIAGNOSIS — R55 Syncope and collapse: Secondary | ICD-10-CM | POA: Diagnosis present

## 2021-01-13 DIAGNOSIS — Z45018 Encounter for adjustment and management of other part of cardiac pacemaker: Secondary | ICD-10-CM

## 2021-01-13 DIAGNOSIS — H4010X0 Unspecified open-angle glaucoma, stage unspecified: Secondary | ICD-10-CM | POA: Diagnosis present

## 2021-01-13 DIAGNOSIS — M1990 Unspecified osteoarthritis, unspecified site: Secondary | ICD-10-CM | POA: Diagnosis present

## 2021-01-13 DIAGNOSIS — G4733 Obstructive sleep apnea (adult) (pediatric): Secondary | ICD-10-CM | POA: Diagnosis present

## 2021-01-13 DIAGNOSIS — Z7901 Long term (current) use of anticoagulants: Secondary | ICD-10-CM

## 2021-01-13 DIAGNOSIS — Z95818 Presence of other cardiac implants and grafts: Secondary | ICD-10-CM

## 2021-01-13 DIAGNOSIS — I4819 Other persistent atrial fibrillation: Secondary | ICD-10-CM | POA: Diagnosis present

## 2021-01-13 DIAGNOSIS — I484 Atypical atrial flutter: Secondary | ICD-10-CM | POA: Diagnosis present

## 2021-01-13 DIAGNOSIS — E7800 Pure hypercholesterolemia, unspecified: Secondary | ICD-10-CM | POA: Diagnosis present

## 2021-01-13 DIAGNOSIS — G250 Essential tremor: Secondary | ICD-10-CM | POA: Diagnosis present

## 2021-01-13 DIAGNOSIS — I453 Trifascicular block: Secondary | ICD-10-CM | POA: Diagnosis present

## 2021-01-13 DIAGNOSIS — E785 Hyperlipidemia, unspecified: Secondary | ICD-10-CM | POA: Diagnosis present

## 2021-01-13 DIAGNOSIS — Z8701 Personal history of pneumonia (recurrent): Secondary | ICD-10-CM

## 2021-01-13 DIAGNOSIS — Z950 Presence of cardiac pacemaker: Secondary | ICD-10-CM

## 2021-01-13 DIAGNOSIS — Z981 Arthrodesis status: Secondary | ICD-10-CM

## 2021-01-13 DIAGNOSIS — Z20822 Contact with and (suspected) exposure to covid-19: Secondary | ICD-10-CM | POA: Diagnosis present

## 2021-01-13 DIAGNOSIS — I5033 Acute on chronic diastolic (congestive) heart failure: Secondary | ICD-10-CM | POA: Diagnosis present

## 2021-01-13 DIAGNOSIS — I495 Sick sinus syndrome: Secondary | ICD-10-CM | POA: Diagnosis present

## 2021-01-13 DIAGNOSIS — Z79899 Other long term (current) drug therapy: Secondary | ICD-10-CM

## 2021-01-13 DIAGNOSIS — Z9181 History of falling: Secondary | ICD-10-CM

## 2021-01-13 DIAGNOSIS — H3530 Unspecified macular degeneration: Secondary | ICD-10-CM | POA: Diagnosis present

## 2021-01-13 DIAGNOSIS — J45909 Unspecified asthma, uncomplicated: Secondary | ICD-10-CM | POA: Diagnosis present

## 2021-01-13 DIAGNOSIS — E871 Hypo-osmolality and hyponatremia: Secondary | ICD-10-CM | POA: Diagnosis present

## 2021-01-13 DIAGNOSIS — I110 Hypertensive heart disease with heart failure: Secondary | ICD-10-CM | POA: Diagnosis present

## 2021-01-13 HISTORY — DX: Atrioventricular block, complete: I44.2

## 2021-01-13 LAB — CBC WITH DIFFERENTIAL/PLATELET
Abs Immature Granulocytes: 0.05 10*3/uL (ref 0.00–0.07)
Basophils Absolute: 0 10*3/uL (ref 0.0–0.1)
Basophils Relative: 0 %
Eosinophils Absolute: 0 10*3/uL (ref 0.0–0.5)
Eosinophils Relative: 0 %
HCT: 36.9 % — ABNORMAL LOW (ref 39.0–52.0)
Hemoglobin: 11.9 g/dL — ABNORMAL LOW (ref 13.0–17.0)
Immature Granulocytes: 1 %
Lymphocytes Relative: 8 %
Lymphs Abs: 0.8 10*3/uL (ref 0.7–4.0)
MCH: 30.1 pg (ref 26.0–34.0)
MCHC: 32.2 g/dL (ref 30.0–36.0)
MCV: 93.4 fL (ref 80.0–100.0)
Monocytes Absolute: 0.5 10*3/uL (ref 0.1–1.0)
Monocytes Relative: 5 %
Neutro Abs: 8.6 10*3/uL — ABNORMAL HIGH (ref 1.7–7.7)
Neutrophils Relative %: 86 %
Platelets: 176 10*3/uL (ref 150–400)
RBC: 3.95 MIL/uL — ABNORMAL LOW (ref 4.22–5.81)
RDW: 15 % (ref 11.5–15.5)
WBC: 10 10*3/uL (ref 4.0–10.5)
nRBC: 0 % (ref 0.0–0.2)

## 2021-01-13 LAB — COMPREHENSIVE METABOLIC PANEL
ALT: 72 U/L — ABNORMAL HIGH (ref 0–44)
AST: 112 U/L — ABNORMAL HIGH (ref 15–41)
Albumin: 3.7 g/dL (ref 3.5–5.0)
Alkaline Phosphatase: 69 U/L (ref 38–126)
Anion gap: 11 (ref 5–15)
BUN: 20 mg/dL (ref 8–23)
CO2: 21 mmol/L — ABNORMAL LOW (ref 22–32)
Calcium: 8.6 mg/dL — ABNORMAL LOW (ref 8.9–10.3)
Chloride: 96 mmol/L — ABNORMAL LOW (ref 98–111)
Creatinine, Ser: 1.12 mg/dL (ref 0.61–1.24)
GFR, Estimated: >60 mL/min (ref >60)
Glucose, Bld: 219 mg/dL — ABNORMAL HIGH (ref 70–99)
Potassium: 4.3 mmol/L (ref 3.5–5.1)
Sodium: 128 mmol/L — ABNORMAL LOW (ref 135–145)
Total Bilirubin: 1 mg/dL (ref 0.3–1.2)
Total Protein: 6.5 g/dL (ref 6.5–8.1)

## 2021-01-13 LAB — TROPONIN I (HIGH SENSITIVITY): Troponin I (High Sensitivity): 25 ng/L — ABNORMAL HIGH (ref <18)

## 2021-01-13 LAB — I-STAT CHEM 8, ED
BUN: 22 mg/dL (ref 8–23)
Calcium, Ion: 1.01 mmol/L — ABNORMAL LOW (ref 1.15–1.40)
Chloride: 95 mmol/L — ABNORMAL LOW (ref 98–111)
Creatinine, Ser: 0.9 mg/dL (ref 0.61–1.24)
Glucose, Bld: 210 mg/dL — ABNORMAL HIGH (ref 70–99)
HCT: 39 % (ref 39.0–52.0)
Hemoglobin: 13.3 g/dL (ref 13.0–17.0)
Potassium: 4.3 mmol/L (ref 3.5–5.1)
Sodium: 129 mmol/L — ABNORMAL LOW (ref 135–145)
TCO2: 24 mmol/L (ref 22–32)

## 2021-01-13 LAB — RESP PANEL BY RT-PCR (FLU A&B, COVID) ARPGX2
Influenza A by PCR: NEGATIVE
Influenza B by PCR: NEGATIVE
SARS Coronavirus 2 by RT PCR: NEGATIVE

## 2021-01-13 LAB — BRAIN NATRIURETIC PEPTIDE: B Natriuretic Peptide: 347 pg/mL — ABNORMAL HIGH (ref 0.0–100.0)

## 2021-01-13 MED ORDER — FENTANYL CITRATE PF 50 MCG/ML IJ SOSY
50.0000 ug | PREFILLED_SYRINGE | Freq: Once | INTRAMUSCULAR | Status: AC
  Administered 2021-01-13: 50 ug via INTRAVENOUS

## 2021-01-13 MED ORDER — CALCIUM GLUCONATE-NACL 2-0.675 GM/100ML-% IV SOLN: 2.0000 g | Freq: Once | INTRAVENOUS | Status: DC

## 2021-01-13 MED ORDER — DOPAMINE-DEXTROSE 3.2-5 MG/ML-% IV SOLN
0.0000 ug/kg/min | INTRAVENOUS | Status: DC
  Administered 2021-01-13: 5 ug/kg/min via INTRAVENOUS

## 2021-01-13 MED ORDER — MAGNESIUM SULFATE 2 GM/50ML IV SOLN
2.0000 g | Freq: Once | INTRAVENOUS | Status: AC
  Administered 2021-01-13: 2 g via INTRAVENOUS

## 2021-01-13 MED ORDER — SODIUM CHLORIDE 0.9 % IV SOLN
8.0000 mg | Freq: Once | INTRAVENOUS | Status: AC
  Administered 2021-01-14: 8 mg via INTRAVENOUS
  Filled 2021-01-13: qty 4

## 2021-01-13 MED ORDER — ATROPINE SULFATE 1 MG/ML IJ SOLN
1.0000 mg | Freq: Once | INTRAMUSCULAR | Status: AC
  Administered 2021-01-13: 1 mg via INTRAVENOUS

## 2021-01-13 MED ORDER — CALCIUM GLUCONATE-NACL 1-0.675 GM/50ML-% IV SOLN
1.0000 g | INTRAVENOUS | Status: DC
Start: 2021-01-13 — End: 2021-01-13
  Filled 2021-01-13 (×2): qty 50

## 2021-01-13 NOTE — Code Documentation (Signed)
Pt had a syncopal episode when being rolled to his right side

## 2021-01-13 NOTE — ED Notes (Signed)
mA increased to 126

## 2021-01-13 NOTE — ED Notes (Signed)
Pt had large BM en route. Pt cleaned. When rolled, pt went unresponsive and cardiac rhythm showed VT and/or torsades. MD aware.  Pt placed on back and set up, pt responsive again.

## 2021-01-13 NOTE — ED Notes (Signed)
Pt's religious leader at bedside for annointing.

## 2021-01-13 NOTE — ED Provider Notes (Signed)
[facility] EMERGENCY DEPARTMENT Provider Note   CSN: [id number] Arrival date & time: 01/13/21  2141     History Chief Complaint  Patient presents with   Bradycardia    Nicholas Robbins is a 87 y.o. male with PMH trifascicular block, A. fib on anticoagulation with a loop recorder in place, patient of Dr. Jacinto Halim who presents to the emergency department due to concern for symptomatic bradycardia.  Patient has had multiple syncopal episodes today per EMS and was found to be significantly hypotensive in the field with heart rates in the 20s.  He arrives with active transcutaneous pacing and is alert and oriented answering questions appropriately.  Denies chest pain, abdominal pain, nausea, vomiting or other systemic symptoms when being transcutaneously paced.  HPI     Past Medical History:  Diagnosis Date   Arthritis    Asthma    Benign essential tremor    BPH (benign prostatic hyperplasia)    Central serous retinopathy    COAG (chronic open-angle glaucoma)    Colon polyps    Diverticulitis    Essential hypertension 06/22/2018   Glaucoma    Hypercholesterolemia 06/22/2018   Hyperlipidemia    Hypertension    Macular degeneration    Macular hole    NSVT (nonsustained ventricular tachycardia) (HCC) 06/22/2018   Event Monitor 30 days 05/10/2018: Performed for syncope Predominant rhythm is sinus rhythm with first-degree AV block. No symptoms reported. One 4 beat VT at 8:46PM, Occasional PVCs.   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    Patient Active Problem List   Diagnosis Date Noted   Gait abnormality 11/01/2020   Sialorrhea 10/31/2020   Atypical atrial flutter (HCC)    Persistent atrial fibrillation (HCC)    Essential hypertension 06/22/2018   Hypercholesterolemia 06/22/2018   NSVT (nonsustained ventricular tachycardia) (HCC) 06/22/2018    Trifascicular block 06/22/2018   Angina pectoris (HCC) 06/22/2018   Coronary artery calcification 06/22/2018   Hyperlipidemia 06/22/2018   Syncope and collapse 05/07/2018   Parkinson's disease (HCC) 04/15/2018   Tremor of left hand 02/09/2017   LLQ abdominal pain 04/30/2016   Nausea without vomiting 04/30/2016   Abnormal nuclear stress test 06/16/2014   OSA on CPAP 05/08/2014    Past Surgical History:  Procedure Laterality Date   BACK SURGERY     lumbar fusion   broken ankle  1961   CARDIOVERSION N/A 06/19/2020   Procedure: CARDIOVERSION;  Surgeon: Yates Decamp, MD;  Location: [facility] END[facility];  Service: Cardiovascular;  Laterality: N/A;   CARPAL TUNNEL RELEASE Right 10/15/2017   Procedure: RIGHT CARPAL TUNNEL RELEASE;  Surgeon: Cindee Salt, MD;  Location: [facility];  Service: Orthopedics;  Laterality: Right;   CATARACT EXTRACTION, BILATERAL     HERNIA REPAIR     REPLACEMENT TOTAL KNEE  2005   VITRECTOMY Left 2006       Family History  Problem Relation Age of Onset   Hypertension Mother    Stroke Mother    Ulcers Father 28       bleeding/ deceased   Lymphoma Brother        cancer   Breast cancer Sister    Colon cancer Neg Hx    Stomach cancer Neg Hx    Rectal cancer Neg Hx    Esophageal cancer Neg Hx    Liver cancer Neg  Hx     Social History   Tobacco Use   Smoking status: Never   Smokeless tobacco: Never  Vaping Use   Vaping Use: Never used  Substance Use Topics   Alcohol use: Yes    Alcohol/week: 0.0 standard drinks    Comment: occasional   Drug use: No    Home Medications Prior to Admission medications   Medication Sig Start Date End Date Taking? Authorizing Provider  acetaminophen (TYLENOL) 500 MG tablet Take 1,000 mg by mouth daily as needed (pain).    [provider]  alfuzosin (UROXATRAL) 10 MG 24 hr tablet Take 10 mg by mouth daily. 05/14/20   [provider]  apixaban (ELIQUIS) 5 MG TABS tablet Take 5 mg by mouth 2  (two) times daily.    [provider]  Bioflavonoid Products (VITAMIN C) CHEW Chew 1 tablet by mouth daily. Gummy    [provider]  BOTOX 100 units SOLR injection  10/18/20   [provider]  carbidopa-levodopa (SINEMET IR) 25-100 MG tablet TAKE 1 TABLET BY MOUTH THREE TIMES A DAY 09/25/20   Butch Penny, NP  COD LIVER OIL PO Take 5 mLs by mouth daily.    [provider]  ELDERBERRY PO Take 100 mg by mouth daily.    [provider]  fexofenadine (ALLEGRA) 180 MG tablet Take 180 mg by mouth every evening.    [provider]  finasteride (PROSCAR) 5 MG tablet Take 5 mg by mouth daily. 09/21/14   [provider]  fluocinonide (LIDEX) 0.05 % external solution Apply 1 application topically See admin instructions. Instil 2 gtts in ear twice weekly    [provider]  folic acid (FOLVITE) 800 MCG tablet Take 800 mcg by mouth every evening.    [provider]  Glucosamine-Chondroitin (COSAMIN DS PO) Take 1 tablet by mouth in the morning and at bedtime.    [provider]  hyoscyamine (LEVSIN SL) 0.125 MG SL tablet Place 0.125 mg under the tongue as needed for cramping.     [provider]  losartan-hydrochlorothiazide (HYZAAR) 50-12.5 MG tablet Take 1 tablet by mouth every morning. 11/20/20   Yates Decamp, MD  modafinil (PROVIGIL) 100 MG tablet TAKE 1.5 TABLETS BY MOUTH DAILY 01/01/21   Butch Penny, NP  Multiple Vitamins-Minerals (PRESERVISION AREDS 2 PO) Take 1 tablet by mouth in the morning and at bedtime.    [provider]  pravastatin (PRAVACHOL) 40 MG tablet Take 40 mg by mouth at bedtime. 04/09/15   [provider]  Probiotic Product (ALIGN PO) Take 1 capsule by mouth daily.    [provider]  silodosin (RAPAFLO) 8 MG CAPS capsule Take 8 mg by mouth daily with breakfast.    [provider]  Travoprost, BAK Free, (TRAVATAN) 0.004 % SOLN ophthalmic solution Place 1  drop into both eyes at bedtime.    [provider]  vitamin B-12 (CYANOCOBALAMIN) 1000 MCG tablet Take 1,000 mcg by mouth daily.    [provider]    Allergies    Patient has no known allergies.  Review of Systems   Review of Systems  Constitutional:  Negative for chills and fever.  HENT:  Negative for ear pain and sore throat.   Eyes:  Negative for pain and visual disturbance.  Respiratory:  Negative for cough and shortness of breath.   Cardiovascular:  Negative for chest pain and palpitations.  Gastrointestinal:  Negative for abdominal pain and vomiting.  Genitourinary:  Negative for dysuria and hematuria.  Musculoskeletal:  Negative for arthralgias and back pain.  Skin:  Negative for color change and rash.  Neurological:  Positive for syncope. Negative for seizures.  All other systems reviewed and are negative.  Physical Exam Updated Vital Signs There were no vitals taken for this visit.  Physical Exam Vitals and nursing note reviewed.  Constitutional:      Appearance: He is well-developed. He is ill-appearing.  HENT:     Head: Normocephalic and atraumatic.  Eyes:     Conjunctiva/sclera: Conjunctivae normal.  Cardiovascular:     Rate and Rhythm: Regular rhythm. Bradycardia present.     Heart sounds: No murmur heard. Pulmonary:     Effort: Pulmonary effort is normal. No respiratory distress.     Breath sounds: Normal breath sounds.  Abdominal:     Palpations: Abdomen is soft.     Tenderness: There is no abdominal tenderness.  Musculoskeletal:     Cervical back: Neck supple.  Skin:    General: Skin is warm and dry.  Neurological:     Mental Status: He is alert.    ED Results / Procedures / Treatments   Labs (all labs ordered are listed, but only abnormal results are displayed) Labs Reviewed  I-STAT CHEM 8, ED - Abnormal; Notable for the following components:      Result Value   Sodium 129 (*)    Chloride 95 (*)    Glucose, Bld 210 (*)     Calcium, Ion 1.01 (*)    All other components within normal limits  COMPREHENSIVE METABOLIC PANEL  BRAIN NATRIURETIC PEPTIDE  CBC WITH DIFFERENTIAL/PLATELET  TROPONIN I (HIGH SENSITIVITY)    EKG None  Radiology No results found.  Procedures .Critical Care Performed by: Glendora Score, MD Authorized by: Glendora Score, MD   Critical care provider statement:    Critical care time (minutes):  90   Critical care was necessary to treat or prevent imminent or life-threatening deterioration of the following conditions:  Cardiac failure and circulatory failure   Critical care was time spent personally by me on the following activities:  Discussions with consultants, evaluation of patient's response to treatment, examination of patient, ordering and performing treatments and interventions, ordering and review of laboratory studies, ordering and review of radiographic studies, pulse oximetry, re-evaluation of patient's condition, obtaining history from patient or surrogate and review of old charts   Medications Ordered in ED Medications  fentaNYL (SUBLIMAZE) injection 50 mcg (50 mcg Intravenous Given 01/13/21 2151)    ED Course  I have reviewed the triage vital signs and the nursing notes.  Pertinent labs & imaging results that were available during my care of the patient were reviewed by me and considered in my medical decision making (see chart for details).    MDM Rules/Calculators/A&P                           Patient seen emergency department for evaluation of syncope secondary to symptomatic bradycardia.  Patient arrives with active transcutaneous pacing and is alert and oriented when being transcutaneously paced.  When attempting to pause transcutaneous pacing to obtain EKG, patient became hypotensive and altered and we were unable to capture the original rhythm.  He was given 1 mg of atropine which led to a return of the native rhythm 2 of the low 40s but he clinically did not  improve any transcutaneous pacing was restarted.  Cardiology was consulted emergently  for emergent pacemaker placement.  Initial i-STAT with a sodium of 129, normal potassium at 4.3.  There was some initial concern for possible torsades like activity he was given 2g of IV magnesium.  Unfortunately, the Cath Lab was unavailable due to the concurrent VV ECMO case and a transvenous pacer was placed in the emergency department.  Patient then signed out to oncoming provider.  Patient will be admitted to the cardiology service for pacemaker placement tomorrow. Final Clinical Impression(s) / ED Diagnoses Final diagnoses:  None    Rx / DC Orders ED Discharge Orders     None        Maureena Dabbs, MD 01/14/21 0002

## 2021-01-13 NOTE — Progress Notes (Signed)
Responded to code STEMI for emergency pacemaker. Assisted team in preparing pt for cath lab. Decision made to pace temp pacer in ED. Temp pacer placed Dr Rosemary Holms.

## 2021-01-13 NOTE — ED Triage Notes (Addendum)
Pt from home by [facility] for multiple syncopal episodes at home. EMS arrived to pt lying on the floor, diaphoretic, pulse 25 with AMS. EMS gave NS, 2.5mg  versed for pain control for pacing. Pt had loop recorder palced on 01/03/2021.

## 2021-01-13 NOTE — Progress Notes (Signed)
Situation: Chaplain [place]-Lockley responding to code STEMI for pt Arelia Longest.  Background: Facts: Father (Fr.) Arelia Longest was unavailable at time of visit. Family: Friend and colleague, Fr. Jannetta Quint, was present at time of visit. Feelings: Fr. Louis expressed gratitude for this chaplain's visit. Faith: Fr. Louis shared that he and Fr. Timothey are both [employer] at [employer]. [facility] in [place], [place]. Fr. Louis shared that he had already anointed Fr. Onalee Hua when this chaplain arrived.  Actions & Assessments: Chaplain offered hospitality and support to community clergy and friend, Fr. Lissa Hoard. Fr. Onalee Hua seems to have much spiritual support at this time.  Recommendations: For Chaplains: Continue to support local clergy and Fr. Onalee Hua as needed. 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 Please page chaplain services for any faith-specific requests or needs.  Chaplain remains available for follow-up spiritual/emotional support as needed.  Rev. Mayme Genta, MDiv      01/13/21 2200  Clinical Encounter Type  Visited With Family;Patient not available  Visit Type Initial;ED  Referral From Nurse  Spiritual Encounters  Spiritual Needs Ritual

## 2021-01-13 NOTE — ED Notes (Signed)
2310. Needle insertion by cardiologist.

## 2021-01-13 NOTE — ED Notes (Signed)
Cardiology and fluoroscopy team at bedside. HR decreased to 50bpm by cardiologist.

## 2021-01-14 ENCOUNTER — Other Ambulatory Visit (HOSPITAL_COMMUNITY): Payer: Medicare Other

## 2021-01-14 ENCOUNTER — Encounter (HOSPITAL_COMMUNITY)
Admission: EM | Disposition: A | Discharge status: Home / Self Care | Payer: Self-pay | Source: Home / Self Care | Attending: Cardiology

## 2021-01-14 ENCOUNTER — Inpatient Hospital Stay (HOSPITAL_COMMUNITY): Payer: Medicare Other

## 2021-01-14 ENCOUNTER — Encounter (HOSPITAL_COMMUNITY): Payer: Self-pay | Admitting: Cardiology

## 2021-01-14 DIAGNOSIS — I110 Hypertensive heart disease with heart failure: Secondary | ICD-10-CM | POA: Diagnosis present

## 2021-01-14 DIAGNOSIS — Z7901 Long term (current) use of anticoagulants: Secondary | ICD-10-CM | POA: Diagnosis not present

## 2021-01-14 DIAGNOSIS — I4819 Other persistent atrial fibrillation: Secondary | ICD-10-CM | POA: Diagnosis present

## 2021-01-14 DIAGNOSIS — R55 Syncope and collapse: Secondary | ICD-10-CM | POA: Diagnosis present

## 2021-01-14 DIAGNOSIS — E871 Hypo-osmolality and hyponatremia: Secondary | ICD-10-CM | POA: Diagnosis present

## 2021-01-14 DIAGNOSIS — I484 Atypical atrial flutter: Secondary | ICD-10-CM | POA: Diagnosis present

## 2021-01-14 DIAGNOSIS — H3530 Unspecified macular degeneration: Secondary | ICD-10-CM | POA: Diagnosis present

## 2021-01-14 DIAGNOSIS — I495 Sick sinus syndrome: Secondary | ICD-10-CM | POA: Insufficient documentation

## 2021-01-14 DIAGNOSIS — M1990 Unspecified osteoarthritis, unspecified site: Secondary | ICD-10-CM | POA: Diagnosis present

## 2021-01-14 DIAGNOSIS — I442 Atrioventricular block, complete: Principal | ICD-10-CM | POA: Diagnosis present

## 2021-01-14 DIAGNOSIS — H35719 Central serous chorioretinopathy, unspecified eye: Secondary | ICD-10-CM | POA: Diagnosis present

## 2021-01-14 DIAGNOSIS — Z9181 History of falling: Secondary | ICD-10-CM | POA: Diagnosis not present

## 2021-01-14 DIAGNOSIS — I5033 Acute on chronic diastolic (congestive) heart failure: Secondary | ICD-10-CM | POA: Diagnosis present

## 2021-01-14 DIAGNOSIS — Z950 Presence of cardiac pacemaker: Secondary | ICD-10-CM | POA: Diagnosis not present

## 2021-01-14 DIAGNOSIS — G4733 Obstructive sleep apnea (adult) (pediatric): Secondary | ICD-10-CM | POA: Diagnosis present

## 2021-01-14 DIAGNOSIS — Z20822 Contact with and (suspected) exposure to covid-19: Secondary | ICD-10-CM | POA: Diagnosis present

## 2021-01-14 DIAGNOSIS — E785 Hyperlipidemia, unspecified: Secondary | ICD-10-CM | POA: Diagnosis present

## 2021-01-14 DIAGNOSIS — R2681 Unsteadiness on feet: Secondary | ICD-10-CM | POA: Diagnosis not present

## 2021-01-14 DIAGNOSIS — Z79899 Other long term (current) drug therapy: Secondary | ICD-10-CM | POA: Diagnosis not present

## 2021-01-14 DIAGNOSIS — N400 Enlarged prostate without lower urinary tract symptoms: Secondary | ICD-10-CM | POA: Diagnosis present

## 2021-01-14 DIAGNOSIS — I453 Trifascicular block: Secondary | ICD-10-CM | POA: Diagnosis present

## 2021-01-14 DIAGNOSIS — G250 Essential tremor: Secondary | ICD-10-CM | POA: Diagnosis present

## 2021-01-14 DIAGNOSIS — R001 Bradycardia, unspecified: Secondary | ICD-10-CM | POA: Diagnosis present

## 2021-01-14 DIAGNOSIS — J45909 Unspecified asthma, uncomplicated: Secondary | ICD-10-CM | POA: Diagnosis present

## 2021-01-14 DIAGNOSIS — Z981 Arthrodesis status: Secondary | ICD-10-CM | POA: Diagnosis not present

## 2021-01-14 DIAGNOSIS — E7800 Pure hypercholesterolemia, unspecified: Secondary | ICD-10-CM | POA: Diagnosis present

## 2021-01-14 DIAGNOSIS — H4010X0 Unspecified open-angle glaucoma, stage unspecified: Secondary | ICD-10-CM | POA: Diagnosis present

## 2021-01-14 HISTORY — DX: Syncope and collapse: R55

## 2021-01-14 HISTORY — PX: PACEMAKER IMPLANT: EP1218

## 2021-01-14 HISTORY — DX: Presence of cardiac pacemaker: Z95.0

## 2021-01-14 LAB — TROPONIN I (HIGH SENSITIVITY): Troponin I (High Sensitivity): 59 ng/L — ABNORMAL HIGH (ref <18)

## 2021-01-14 LAB — MRSA NEXT GEN BY PCR, NASAL: MRSA by PCR Next Gen: NOT DETECTED

## 2021-01-14 LAB — HEMOGLOBIN A1C
Hgb A1c MFr Bld: 5.6 % (ref 4.8–5.6)
Mean Plasma Glucose: 114.02 mg/dL

## 2021-01-14 LAB — GLUCOSE, CAPILLARY
Glucose-Capillary: 163 mg/dL — ABNORMAL HIGH (ref 70–99)
Glucose-Capillary: 125 mg/dL — ABNORMAL HIGH (ref 70–99)
Glucose-Capillary: 102 mg/dL — ABNORMAL HIGH (ref 70–99)

## 2021-01-14 LAB — SURGICAL PCR SCREEN
MRSA, PCR: NEGATIVE
Staphylococcus aureus: POSITIVE — AB

## 2021-01-14 LAB — TSH: TSH: 2.762 u[IU]/mL (ref 0.350–4.500)

## 2021-01-14 SURGERY — PACEMAKER IMPLANT

## 2021-01-14 MED ORDER — CHLORHEXIDINE GLUCONATE 4 % EX LIQD
60.0000 mL | Freq: Once | CUTANEOUS | Status: AC
Start: 2021-01-14 — End: 2021-01-14
  Administered 2021-01-14: 4 via TOPICAL
  Filled 2021-01-14: qty 60

## 2021-01-14 MED ORDER — MELATONIN 3 MG PO TABS
3.0000 mg | ORAL_TABLET | Freq: Every day | ORAL | Status: DC
  Administered 2021-01-14: 3 mg via ORAL
  Filled 2021-01-14: qty 1

## 2021-01-14 MED ORDER — SODIUM CHLORIDE 0.9 % IV SOLN
INTRAVENOUS | Status: AC
  Filled 2021-01-14: qty 2

## 2021-01-14 MED ORDER — PRAVASTATIN SODIUM 40 MG PO TABS
40.0000 mg | ORAL_TABLET | Freq: Every day | ORAL | Status: DC
  Administered 2021-01-14: 40 mg via ORAL
  Filled 2021-01-14: qty 1

## 2021-01-14 MED ORDER — ORAL CARE MOUTH RINSE
15.0000 mL | Freq: Two times a day (BID) | OROMUCOSAL | Status: DC
  Administered 2021-01-14: 15 mL via OROMUCOSAL

## 2021-01-14 MED ORDER — MUPIROCIN 2 % EX OINT
1.0000 "application " | TOPICAL_OINTMENT | Freq: Two times a day (BID) | CUTANEOUS | Status: DC
  Administered 2021-01-14: 1 via NASAL
  Filled 2021-01-14: qty 22

## 2021-01-14 MED ORDER — MIDAZOLAM HCL 5 MG/5ML IJ SOLN
INTRAMUSCULAR | Status: AC
  Filled 2021-01-14: qty 5

## 2021-01-14 MED ORDER — LIDOCAINE HCL (PF) 1 % IJ SOLN
INTRAMUSCULAR | Status: DC | PRN
  Administered 2021-01-14: 60 mL

## 2021-01-14 MED ORDER — ONDANSETRON HCL 4 MG/2ML IJ SOLN: 4.0000 mg | Freq: Four times a day (QID) | INTRAMUSCULAR | Status: DC | PRN

## 2021-01-14 MED ORDER — MODAFINIL 100 MG PO TABS
150.0000 mg | ORAL_TABLET | Freq: Every day | ORAL | Status: DC
  Filled 2021-01-14: qty 2

## 2021-01-14 MED ORDER — ALFUZOSIN HCL ER 10 MG PO TB24
10.0000 mg | ORAL_TABLET | Freq: Every day | ORAL | Status: DC
  Administered 2021-01-14: 10 mg via ORAL
  Filled 2021-01-14: qty 1

## 2021-01-14 MED ORDER — CEFAZOLIN SODIUM-DEXTROSE 2-4 GM/100ML-% IV SOLN
INTRAVENOUS | Status: AC
  Filled 2021-01-14: qty 100

## 2021-01-14 MED ORDER — HEPARIN (PORCINE) IN NACL 1000-0.9 UT/500ML-% IV SOLN
INTRAVENOUS | Status: DC | PRN
  Administered 2021-01-14: 500 mL

## 2021-01-14 MED ORDER — SODIUM CHLORIDE 0.9 % IV SOLN
80.0000 mg | INTRAVENOUS | Status: AC
  Administered 2021-01-14: 80 mg

## 2021-01-14 MED ORDER — CHLORHEXIDINE GLUCONATE CLOTH 2 % EX PADS: 6.0000 | MEDICATED_PAD | Freq: Every day | CUTANEOUS | Status: DC

## 2021-01-14 MED ORDER — MIDAZOLAM HCL 5 MG/5ML IJ SOLN
INTRAMUSCULAR | Status: DC | PRN
  Administered 2021-01-14: 1 mg via INTRAVENOUS

## 2021-01-14 MED ORDER — SODIUM CHLORIDE 0.9 % IV SOLN: INTRAVENOUS | Status: DC

## 2021-01-14 MED ORDER — CEFAZOLIN SODIUM-DEXTROSE 2-4 GM/100ML-% IV SOLN
2.0000 g | INTRAVENOUS | Status: AC
  Administered 2021-01-14: 2 g via INTRAVENOUS

## 2021-01-14 MED ORDER — SODIUM CHLORIDE 0.9% FLUSH
10.0000 mL | Freq: Two times a day (BID) | INTRAVENOUS | Status: DC
  Administered 2021-01-14: 10 mL

## 2021-01-14 MED ORDER — ACETAMINOPHEN 325 MG PO TABS
325.0000 mg | ORAL_TABLET | ORAL | Status: DC | PRN
  Filled 2021-01-14: qty 2

## 2021-01-14 MED ORDER — SODIUM CHLORIDE 0.9% FLUSH
3.0000 mL | Freq: Two times a day (BID) | INTRAVENOUS | Status: DC
  Administered 2021-01-14 (×2): 3 mL via INTRAVENOUS

## 2021-01-14 MED ORDER — SODIUM CHLORIDE 0.9% FLUSH: 3.0000 mL | INTRAVENOUS | Status: DC | PRN

## 2021-01-14 MED ORDER — OXYCODONE HCL 5 MG PO TABS
5.0000 mg | ORAL_TABLET | Freq: Four times a day (QID) | ORAL | Status: DC | PRN
  Administered 2021-01-14 (×2): 5 mg via ORAL
  Filled 2021-01-14 (×2): qty 1

## 2021-01-14 MED ORDER — FENTANYL CITRATE PF 50 MCG/ML IJ SOSY
25.0000 ug | PREFILLED_SYRINGE | Freq: Once | INTRAMUSCULAR | Status: AC
  Administered 2021-01-14: 25 ug via INTRAVENOUS

## 2021-01-14 MED ORDER — SODIUM CHLORIDE 0.9 % IV SOLN
250.0000 mL | INTRAVENOUS | Status: DC
Start: 2021-01-14 — End: 2021-01-14
  Administered 2021-01-14: 250 mL via INTRAVENOUS

## 2021-01-14 MED ORDER — CHLORHEXIDINE GLUCONATE CLOTH 2 % EX PADS
6.0000 | MEDICATED_PAD | Freq: Every day | CUTANEOUS | Status: DC
  Administered 2021-01-15 – 2021-01-16 (×2): 6 via TOPICAL

## 2021-01-14 MED ORDER — SODIUM CHLORIDE 0.9% FLUSH
3.0000 mL | Freq: Two times a day (BID) | INTRAVENOUS | Status: DC
  Administered 2021-01-14: 3 mL via INTRAVENOUS

## 2021-01-14 MED ORDER — INSULIN ASPART 100 UNIT/ML IJ SOLN
0.0000 [IU] | Freq: Three times a day (TID) | INTRAMUSCULAR | Status: DC
  Administered 2021-01-14: 1 [IU] via SUBCUTANEOUS

## 2021-01-14 MED ORDER — SODIUM CHLORIDE 0.9% FLUSH: 10.0000 mL | INTRAVENOUS | Status: DC | PRN

## 2021-01-14 MED ORDER — HYOSCYAMINE SULFATE 0.125 MG SL SUBL
0.1250 mg | SUBLINGUAL_TABLET | SUBLINGUAL | Status: DC | PRN
  Filled 2021-01-14: qty 1

## 2021-01-14 MED ORDER — CARBIDOPA-LEVODOPA 25-100 MG PO TABS
1.0000 | ORAL_TABLET | Freq: Three times a day (TID) | ORAL | Status: DC
  Administered 2021-01-14: 1 via ORAL
  Filled 2021-01-14 (×3): qty 1

## 2021-01-14 MED ORDER — FENTANYL CITRATE (PF) 100 MCG/2ML IJ SOLN
INTRAMUSCULAR | Status: DC | PRN
  Administered 2021-01-14: 25 ug via INTRAVENOUS

## 2021-01-14 MED ORDER — CHLORHEXIDINE GLUCONATE CLOTH 2 % EX PADS
6.0000 | MEDICATED_PAD | Freq: Every day | CUTANEOUS | Status: DC
  Administered 2021-01-14: 6 via TOPICAL

## 2021-01-14 MED ORDER — FENTANYL CITRATE (PF) 100 MCG/2ML IJ SOLN
INTRAMUSCULAR | Status: AC
  Filled 2021-01-14: qty 2

## 2021-01-14 MED ORDER — ACETAMINOPHEN 500 MG PO TABS
1000.0000 mg | ORAL_TABLET | Freq: Every day | ORAL | Status: DC | PRN
  Administered 2021-01-14 (×2): 1000 mg via ORAL
  Filled 2021-01-14 (×2): qty 2

## 2021-01-14 SURGICAL SUPPLY — 13 items
CABLE SURGICAL S-101-97-12 (CABLE) ×2 IMPLANT
CATH SELECT PACE 669183 (CATHETERS) ×2 IMPLANT
CUTTER LV DELIVERY CATHETER 7 (MISCELLANEOUS) ×2 IMPLANT
LEAD INGEVITY 7841 52 (Lead) ×2 IMPLANT
LEAD INGEVITY 7842 59 (Lead) ×2 IMPLANT
MAT PREVALON FULL STRYKER (MISCELLANEOUS) ×2 IMPLANT
PACEMAKER ACCOLADE DR-EL (Pacemaker) ×2 IMPLANT
PAD PRO RADIOLUCENT 2001M-C (PAD) ×2 IMPLANT
SHEATH 7FR PRELUDE SNAP 13 (SHEATH) ×2 IMPLANT
SHEATH 8FR PRELUDE SNAP 13 (SHEATH) ×2 IMPLANT
SHEATH PROBE COVER 6X72 (BAG) ×2 IMPLANT
TRAY PACEMAKER INSERTION (PACKS) ×2 IMPLANT
WIRE HI TORQ VERSACORE-J 145CM (WIRE) ×2 IMPLANT

## 2021-01-14 NOTE — Plan of Care (Signed)
  Problem: Clinical Measurements: Goal: Ability to maintain clinical measurements within normal limits will improve Outcome: Progressing   Problem: Pain Managment: Goal: General experience of comfort will improve Outcome: Progressing   

## 2021-01-14 NOTE — Consult Note (Signed)
Cardiology Consultation:   Patient ID: Nicholas Robbins MRN: [id number]; DOB: [birth year]  Admit date: 01/13/2021 Date of Consult: 01/14/2021  PCP:  Nicholas Ran, MD   [facility] Providers Cardiologist:  None  Electrophysiologist:  Nicholas Prude, MD       Patient Profile:   Nicholas Robbins is a 87 y.o. male with a hx of OSA w/CPAP, NSVT, HTN, HLD, parkinson's with tremor, and persistent Afib, chronic CHF (diastolic), chronic urinary retention, who is being seen 01/14/2021 for the evaluation of symptomatic bradycardia at the request of Dr. Rosemary Robbins.  History of Present Illness:   Nicholas Robbins was referred to Nicholas Robbins for AFib/flutter and conduction system disease in June, at that time noted that post DCCV the patient had no sense of any improvement in his exertional capacity or feelings of wellbeing, had trifascicular block but denied syncope. And recommended no changes to his regime   Yesterday he developed onset of recurrent dizzy spells and grey-out spells, later in the afternoon a full syncopal spell that he fell with in the bathroom.  No CP, no SOB. He was found in AFib rates 20's and urgent temp pacing wire attempted via R IJ though unable to get stable position and ultimately placed via R fem vein in the ER by Dr. Rosemary Robbins, his Eliquis held for likely need of PPM He is not on any traditional nodal blocking agents at home  LABS K+ 4.3 BUN/Creat 22/0.90 HS Trop 25, 59 WBC 10.0 H/H 11/36 Plts 176 TSH 2.762   Past Medical History:  Diagnosis Date   Arthritis    Asthma    Benign essential tremor    BPH (benign prostatic hyperplasia)    Central serous retinopathy    COAG (chronic open-angle glaucoma)    Colon polyps    Diverticulitis    Essential hypertension 06/22/2018   Glaucoma    Hypercholesterolemia 06/22/2018   Hyperlipidemia    Hypertension    Macular degeneration    Macular hole    NSVT (nonsustained ventricular tachycardia) (HCC) 06/22/2018   Event  Monitor 30 days 05/10/2018: Performed for syncope Predominant rhythm is sinus rhythm with first-degree AV block. No symptoms reported. One 4 beat VT at 8:46PM, Occasional PVCs.   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    Past Surgical History:  Procedure Laterality Date   BACK SURGERY     lumbar fusion   broken ankle  1961   CARDIOVERSION N/A 06/19/2020   Procedure: CARDIOVERSION;  Surgeon: Nicholas Decamp, MD;  Location: [facility] ENDOSCOPY;  Service: Cardiovascular;  Laterality: N/A;   CARPAL TUNNEL RELEASE Right 10/15/2017   Procedure: RIGHT CARPAL TUNNEL RELEASE;  Surgeon: Cindee Salt, MD;  Location: [facility];  Service: Orthopedics;  Laterality: Right;   CATARACT EXTRACTION, BILATERAL     HERNIA REPAIR     REPLACEMENT TOTAL KNEE  2005   VITRECTOMY Left 2006     Home Medications:  Prior to Admission medications   Medication Sig Start Date End Date Taking? Authorizing Provider  acetaminophen (TYLENOL) 500 MG tablet Take 1,000 mg by mouth daily as needed (pain).   Yes [provider]  alfuzosin (UROXATRAL) 10 MG 24 hr tablet Take 10 mg by mouth daily. 05/14/20  Yes [provider]  apixaban (ELIQUIS) 5 MG TABS tablet Take 5 mg by mouth 2 (two) times daily.  Yes [provider]  Bioflavonoid Products (VITAMIN C) CHEW Chew 1 tablet by mouth daily. Gummy   Yes [provider]  BOTOX 100 units SOLR injection Inject 100 Units into the muscle every 3 (three) months. 10/18/20  Yes [provider]  carbidopa-levodopa (SINEMET IR) 25-100 MG tablet TAKE 1 TABLET BY MOUTH THREE TIMES A DAY Patient taking differently: Take 1 tablet by mouth 3 (three) times daily. 09/25/20  Yes Butch Penny, NP  COD LIVER OIL PO Take 5 mLs by mouth daily.   Yes [provider]  ELDERBERRY PO Take 100 mg by mouth daily.   Yes [provider]  fexofenadine (ALLEGRA) 180 MG tablet Take 180 mg by mouth every evening.   Yes [provider]  finasteride (PROSCAR) 5 MG tablet Take 5 mg by mouth daily. 09/21/14  Yes [provider]  folic acid (FOLVITE) 800 MCG tablet Take 800 mcg by mouth every evening.   Yes [provider]  Glucosamine-Chondroitin (COSAMIN DS PO) Take 1 tablet by mouth in the morning and at bedtime.   Yes [provider]  KLOR-CON M10 10 MEQ tablet Take 10 mEq by mouth daily. 11/14/20  Yes [provider]  losartan-hydrochlorothiazide (HYZAAR) 50-12.5 MG tablet Take 1 tablet by mouth every morning. 11/20/20  Yes Nicholas Decamp, MD  modafinil (PROVIGIL) 100 MG tablet TAKE 1.5 TABLETS BY MOUTH DAILY Patient taking differently: Take 150 mg by mouth daily as needed (to prevent sleep while driving). 01/01/21  Yes Butch Penny, NP  Multiple Vitamin (MULTIVITAMIN WITH MINERALS) TABS tablet Take 1 tablet by mouth daily. Without iron   Yes [provider]  Multiple Vitamins-Minerals (PRESERVISION AREDS 2 PO) Take 1 tablet by mouth in the morning and at bedtime.   Yes [provider]  pravastatin (PRAVACHOL) 40 MG tablet Take 40 mg by mouth at bedtime. 04/09/15  Yes [provider]  Probiotic Product (ALIGN PO) Take 1 capsule by mouth daily.   Yes [provider]  Travoprost, BAK Free, (TRAVATAN) 0.004 % SOLN ophthalmic solution Place 1 drop into both eyes at bedtime.   Yes [provider]  triamcinolone (NASACORT) 55 MCG/ACT AERO nasal inhaler Place 2 sprays into the nose at bedtime.   Yes [provider]  vitamin B-12 (CYANOCOBALAMIN) 1000 MCG tablet Take 1,000 mcg by mouth daily.   Yes [provider]  hyoscyamine (LEVSIN SL) 0.125 MG SL tablet Place 0.125 mg under the tongue as needed for cramping.  Patient not taking: Reported on 01/14/2021    [provider]    Inpatient Medications: Scheduled Meds:   alfuzosin  10 mg Oral Daily   carbidopa-levodopa  1 tablet Oral TID   Chlorhexidine Gluconate Cloth  6 each Topical Daily   insulin aspart  0-9 Units Subcutaneous TID WC   mouth rinse  15 mL Mouth Rinse BID   melatonin  3 mg Oral QHS   modafinil  150 mg Oral Daily   pravastatin  40 mg Oral QHS   sodium chloride flush  10-40 mL Intracatheter Q12H   sodium chloride flush  3 mL Intravenous Q12H   Continuous Infusions:  DOPamine Stopped (01/14/21 0158)   PRN Meds: acetaminophen, hyoscyamine, oxyCODONE, sodium chloride flush  Allergies:   No Known Allergies  Social History:   Social History   Socioeconomic History   Marital status: Single    Spouse name: Not on file   Number of children: 0   Years of education: Not on  file   Highest education level: Not on file  Occupational History   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  Lives at home,  With [facility].  Works at [employer].  Lillette Boxer, post graduate.   Single, no kids.     Social Determinants of Health   Financial Resource Strain: Not on file  Food Insecurity: Not on file  Transportation Needs: Not on file  Physical Activity: Not on file  Stress: Not on file  Social Connections: Not on file  Intimate Partner Violence: Not on file    Family History:   Family History  Problem Relation Age of Onset   Hypertension Mother    Stroke Mother    Ulcers Father 25       bleeding/ deceased   Lymphoma Brother        cancer   Breast cancer Sister    Colon cancer Neg Hx    Stomach cancer Neg Hx    Rectal cancer Neg Hx    Esophageal cancer Neg Hx    Liver cancer Neg Hx       ROS:  Please see the history of present illness.  All other ROS reviewed and negative.     Physical Exam/Data:   Vitals:   01/14/21 0700 01/14/21 0800 01/14/21 0801 01/14/21 0900  BP: 109/70 109/68  113/71  Pulse: 70 69  (!) 188  Resp: 18 15  12   Temp:   97.6 F (36.4 C)   TempSrc:   Oral   SpO2: 97% 97%  97%  Weight:      Height:        Intake/Output Summary (Last 24 hours) at 01/14/2021 1016 Last data filed at 01/14/2021 0500 Gross per 24 hour  Intake 12.32 ml  Output 700 ml  Net -687.68 ml   Last 3 Weights 01/14/2021 01/13/2021 10/31/2020  Weight (lbs) 182 lb 5.1 oz 181 lb 182 lb 6.4 oz  Weight (kg) 82.7 kg 82.101 kg 82.736 kg     Body mass index is 30.34 kg/m.  General:  Well nourished, well developed, in no acute distress HEENT: normal Neck: no JVD Vascular: No carotid bruits; Distal pulses 2+ bilaterally Cardiac:  RRR; (paced)no murmurs, gallops or rubs Lungs:  CTA b/l, no wheezing, rhonchi or rales  Abd: soft, nontender, no hepatomegaly  Ext: no edema Musculoskeletal:  No deformities, age appropriate atrophy Skin: warm and dry  Neuro:  baseline tremor, noted LUE/hand, no grossfocal abnormalities noted otherwise Psych:  Normal affect   EKG:  The EKG was personally reviewed and demonstrates:   Unable to locate ER EKGs This AM AFib 26bpm (with pacing held), RBBB  Telemetry:  Telemetry was personally reviewed and demonstrates:   AF V paced 70 (20's with pacing held)  Relevant CV Studies:  June 07, 2020 echo Normal left ventricular function, 53% Mildly dilated left atrium Moderately dilated right atrium Mild to moderate MR Moderate TR  Laboratory Data:  High Sensitivity Troponin:   Recent Labs  Lab 01/13/21 2143 01/14/21 0418  TROPONINIHS 25* 59*     Chemistry  Recent Labs  Lab 01/13/21 2143 01/13/21 2152  NA 128* 129*  K 4.3 4.3  CL 96* 95*  CO2 21*  --   GLUCOSE 219* 210*  BUN 20 22  CREATININE 1.12 0.90  CALCIUM 8.6*  --    GFRNONAA >60  --   ANIONGAP 11  --     Recent Labs  Lab 01/13/21 2143  PROT 6.5  ALBUMIN 3.7  AST 112*  ALT 72*  ALKPHOS 69  BILITOT 1.0   Lipids No results for input(s): CHOL, TRIG, HDL, LABVLDL, LDLCALC, CHOLHDL in the last 168 hours.  Hematology Recent Labs  Lab 01/13/21 2143 01/13/21 2152  WBC 10.0  --   RBC 3.95*  --   HGB 11.9* 13.3  HCT 36.9* 39.0  MCV 93.4  --   MCH 30.1  --   MCHC 32.2  --   RDW 15.0  --   PLT 176  --    Thyroid  Recent Labs  Lab 01/14/21 0418  TSH 2.762    BNP Recent Labs  Lab 01/13/21 2143  BNP 347.0*    DDimer No results for input(s): DDIMER in the last 168 hours.   Radiology/Studies:  DG Chest Port 1 View  Result Date: 01/14/2021 CLINICAL DATA:  Temporary transvenous cardiac pacemaker placement. EXAM: PORTABLE CHEST 1 VIEW COMPARISON:  None. FINDINGS: Lungs are clear. No pneumothorax or pleural effusion. Right internal jugular central venous catheter tip overlies the expected innominate vein. Inferiorly approaching trans venous pacemaker lead overlies the expected right ventricular apex. Mild cardiomegaly is present. Pulmonary vascularity is normal. Implanted loop recorder overlies the left hemithorax. No acute bone abnormality. IMPRESSION: Inferiorly approaching trans venous pacemaker lead overlies the expected right ventricle. Mild cardiomegaly. No superimposed pulmonary edema. Electronically Signed   By: Helyn Numbers M.D.   On: 01/14/2021 02:22   DG Fluoro Guide CV Line-No Report  Result Date: 01/14/2021 Fluoroscopy was utilized by the requesting physician.  No radiographic interpretation.     Assessment and Plan:   Syncope Symptomatic bradycardia No reversible causes Planned for PPM implant today  Nicholas Robbins has seen and examined the patient, discussed rational for PPM implant Discussed procedure and potential risks/benefits, the patient is agreeable to proceed Afib is described as persistent Plan dial lead  device  3. Persistent AFib CHA2DS2Vasc is 4, on eliquis out patient, held for PPM  4. Chronic CHF (diastolic) Loop implanted 12/03/20 for clinical trial Lux-Dx TRENDS Dr. Jacinto Halim reports that the loop will need to be removed at time of pacer implant and saved for him to return to the study   Risk Assessment/Risk Scores:    For questions or updates, please contact [facility] Please consult [email or web address] for contact info under    Signed, Sheilah Pigeon, PA-C  01/14/2021 10:16 AM

## 2021-01-14 NOTE — H&P (Addendum)
Nicholas Robbins is an 87 y.o. male.   Chief Complaint: Syncope HPI:   87 y.o. Caucasian male  with hypertension, hyperlipidemia, HFpEF, mild PH, persistent Afib, recent loop recorder placement (for clinical trial Lux-Dx TRENDS), presented with recurrent syncope episodes.  Patient was found on the flow by EMS with rate in 20s, diaphoretic.Marland Kitchen  EMS and ER telemetry strips showed severe intermittent bradycardia in 20s with reported brief asystole. He had one more syncope in the ED while turning on one side.   Past Medical History:  Diagnosis Date   Arthritis    Asthma    Benign essential tremor    BPH (benign prostatic hyperplasia)    Central serous retinopathy    COAG (chronic open-angle glaucoma)    Colon polyps    Diverticulitis    Essential hypertension 06/22/2018   Glaucoma    Hypercholesterolemia 06/22/2018   Hyperlipidemia    Hypertension    Macular degeneration    Macular hole    NSVT (nonsustained ventricular tachycardia) (HCC) 06/22/2018   Event Monitor 30 days 05/10/2018: Performed for syncope Predominant rhythm is sinus rhythm with first-degree AV block. No symptoms reported. One 4 beat VT at 8:46PM, Occasional PVCs.   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    Past Surgical History:  Procedure Laterality Date   BACK SURGERY     lumbar fusion   broken ankle  1961   CARDIOVERSION N/A 06/19/2020   Procedure: CARDIOVERSION;  Surgeon: Yates Decamp, MD;  Location: [facility] ENDOSCOPY;  Service: Cardiovascular;  Laterality: N/A;   CARPAL TUNNEL RELEASE Right 10/15/2017   Procedure: RIGHT CARPAL TUNNEL RELEASE;  Surgeon: Cindee Salt, MD;  Location: [facility];  Service: Orthopedics;  Laterality: Right;   CATARACT EXTRACTION, BILATERAL     HERNIA REPAIR     REPLACEMENT TOTAL KNEE  2005   VITRECTOMY Left 2006     Family History  Problem Relation Age of Onset    Hypertension Mother    Stroke Mother    Ulcers Father 14       bleeding/ deceased   Lymphoma Brother        cancer   Breast cancer Sister    Colon cancer Neg Hx    Stomach cancer Neg Hx    Rectal cancer Neg Hx    Esophageal cancer Neg Hx    Liver cancer Neg Hx     Social History:  reports that he has never smoked. He has never used smokeless tobacco. He reports current alcohol use. He reports that he does not use drugs.  Allergies: No Known Allergies  Review of Systems  Constitutional: Negative for decreased appetite, malaise/fatigue, weight gain and weight loss.  HENT:  Negative for congestion.   Eyes:  Negative for visual disturbance.  Cardiovascular:  Positive for syncope. Negative for chest pain, dyspnea on exertion, leg swelling and palpitations.  Respiratory:  Negative for cough.   Endocrine: Negative for cold intolerance.  Hematologic/Lymphatic: Does not bruise/bleed easily.  Skin:  Negative for itching and rash.  Musculoskeletal:  Negative for myalgias.  Gastrointestinal:  Negative for abdominal pain, nausea and vomiting.  Genitourinary:  Negative for dysuria.  Neurological:  Negative for dizziness and weakness.  Psychiatric/Behavioral:  The patient is not nervous/anxious.   All other systems reviewed and are negative.   Blood pressure 98/64, pulse 65, temperature 97.6  F (36.4 C), temperature source Temporal, resp. rate 16, SpO2 100 %. Body mass index is 30.12 kg/m.  Physical Exam Vitals and nursing note reviewed.  Constitutional:      General: He is in acute distress.     Appearance: He is well-developed. He is diaphoretic.  HENT:     Head: Normocephalic and atraumatic.  Eyes:     Conjunctiva/sclera: Conjunctivae normal.     Pupils: Pupils are equal, round, and reactive to light.  Neck:     Vascular: No JVD.  Cardiovascular:     Rate and Rhythm: Normal rate and regular rhythm.     Pulses: Normal pulses and intact distal pulses.     Heart sounds: No  murmur heard. Pulmonary:     Effort: Pulmonary effort is normal.     Breath sounds: Normal breath sounds. No wheezing or rales.  Abdominal:     General: Bowel sounds are normal.     Palpations: Abdomen is soft.     Tenderness: There is no rebound.  Musculoskeletal:        General: No tenderness. Normal range of motion.     Right lower leg: No edema.  Lymphadenopathy:     Cervical: No cervical adenopathy.  Skin:    General: Skin is warm.  Neurological:     Mental Status: He is alert and oriented to person, place, and time.     Cranial Nerves: No cranial nerve deficit.    Results for orders placed or performed during the hospital encounter of 01/13/21 (from the past 48 hour(s))  Comprehensive metabolic panel     Status: Abnormal   Collection Time: 01/13/21  9:43 PM  Result Value Ref Range   Sodium 128 (L) 135 - 145 mmol/L   Potassium 4.3 3.5 - 5.1 mmol/L   Chloride 96 (L) 98 - 111 mmol/L   CO2 21 (L) 22 - 32 mmol/L   Glucose, Bld 219 (H) 70 - 99 mg/dL    Comment: Glucose reference range applies only to samples taken after fasting for at least 8 hours.   BUN 20 8 - 23 mg/dL   Creatinine, Ser 1.61 0.61 - 1.24 mg/dL   Calcium 8.6 (L) 8.9 - 10.3 mg/dL   Total Protein 6.5 6.5 - 8.1 g/dL   Albumin 3.7 3.5 - 5.0 g/dL   AST 096 (H) 15 - 41 U/L   ALT 72 (H) 0 - 44 U/L   Alkaline Phosphatase 69 38 - 126 U/L   Total Bilirubin 1.0 0.3 - 1.2 mg/dL   GFR, Estimated >04 >54 mL/min    Comment: (NOTE) Calculated using the CKD-EPI Creatinine Equation (2021)    Anion gap 11 5 - 15    Comment: Performed at [facility], [address], [place], [place] [address]  Troponin I (High Sensitivity)     Status: Abnormal   Collection Time: 01/13/21  9:43 PM  Result Value Ref Range   Troponin I (High Sensitivity) 25 (H) <18 ng/L    Comment: (NOTE) Elevated high sensitivity troponin I (hsTnI) values and significant  changes across serial measurements may suggest ACS but many other  chronic  and acute conditions are known to elevate hsTnI results.  Refer to the "Links" section for chest pain algorithms and additional  guidance. Performed at [facility], [address], [place], [place] [address]   Brain natriuretic peptide     Status: Abnormal   Collection Time: 01/13/21  9:43 PM  Result Value  Ref Range   B Natriuretic Peptide 347.0 (H) 0.0 - 100.0 pg/mL    Comment: Performed at [facility], [address], [place], [place] [address]  CBC with Differential     Status: Abnormal   Collection Time: 01/13/21  9:43 PM  Result Value Ref Range   WBC 10.0 4.0 - 10.5 K/uL   RBC 3.95 (L) 4.22 - 5.81 MIL/uL   Hemoglobin 11.9 (L) 13.0 - 17.0 g/dL   HCT 25.3 (L) 66.4 - 40.3 %   MCV 93.4 80.0 - 100.0 fL   MCH 30.1 26.0 - 34.0 pg   MCHC 32.2 30.0 - 36.0 g/dL   RDW 47.4 25.9 - 56.3 %   Platelets 176 150 - 400 K/uL   nRBC 0.0 0.0 - 0.2 %   Neutrophils Relative % 86 %   Neutro Abs 8.6 (H) 1.7 - 7.7 K/uL   Lymphocytes Relative 8 %   Lymphs Abs 0.8 0.7 - 4.0 K/uL   Monocytes Relative 5 %   Monocytes Absolute 0.5 0.1 - 1.0 K/uL   Eosinophils Relative 0 %   Eosinophils Absolute 0.0 0.0 - 0.5 K/uL   Basophils Relative 0 %   Basophils Absolute 0.0 0.0 - 0.1 K/uL   Immature Granulocytes 1 %   Abs Immature Granulocytes 0.05 0.00 - 0.07 K/uL    Comment: Performed at [facility], [address] [place], [place] [address]  I-stat chem 8, ED (not at [facility] or [facility])     Status: Abnormal   Collection Time: 01/13/21  9:52 PM  Result Value Ref Range   Sodium 129 (L) 135 - 145 mmol/L   Potassium 4.3 3.5 - 5.1 mmol/L   Chloride 95 (L) 98 - 111 mmol/L   BUN 22 8 - 23 mg/dL   Creatinine, Ser 3.32 0.61 - 1.24 mg/dL   Glucose, Bld 951 (H) 70 - 99 mg/dL    Comment: Glucose reference range applies only to samples taken after fasting for at least 8 hours.   Calcium, Ion 1.01 (L) 1.15 - 1.40 mmol/L   TCO2 24 22 - 32 mmol/L   Hemoglobin 13.3 13.0 - 17.0 g/dL   HCT 88.4 16.6 -  06.3 %  Resp Panel by RT-PCR (Flu A&B, Covid) Nasopharyngeal Swab     Status: None   Collection Time: 01/13/21 10:13 PM   Specimen: Nasopharyngeal Swab; Nasopharyngeal(NP) swabs in vial transport medium  Result Value Ref Range   SARS Coronavirus 2 by RT PCR NEGATIVE NEGATIVE    Comment: (NOTE) SARS-CoV-2 target nucleic acids are NOT DETECTED.  The SARS-CoV-2 RNA is generally detectable in upper respiratory specimens during the acute phase of infection. The lowest concentration of SARS-CoV-2 viral copies this assay can detect is 138 copies/mL. A negative result does not preclude SARS-Cov-2 infection and should not be used as the sole basis for treatment or other patient management decisions. A negative result may occur with  improper specimen collection/handling, submission of specimen other than nasopharyngeal swab, presence of viral mutation(s) within the areas targeted by this assay, and inadequate number of viral copies(<138 copies/mL). A negative result must be combined with clinical observations, patient history, and epidemiological information. The expected result is Negative.  Fact Sheet for Patients:  BloggerCourse.com  Fact Sheet for Healthcare Providers:  SeriousBroker.it  This test is no t yet approved or cleared by the [place] FDA and  has been authorized for detection and/or diagnosis of SARS-CoV-2 by FDA under an Emergency Use Authorization (  EUA). This EUA will remain  in effect (meaning this test can be used) for the duration of the COVID-19 declaration under Section 564(b)(1) of the Act, 21 U.S.C.section 360bbb-3(b)(1), unless the authorization is terminated  or revoked sooner.       Influenza A by PCR NEGATIVE NEGATIVE   Influenza B by PCR NEGATIVE NEGATIVE    Comment: (NOTE) The Xpert Xpress SARS-CoV-2/FLU/RSV plus assay is intended as an aid in the diagnosis of influenza from Nasopharyngeal swab  specimens and should not be used as a sole basis for treatment. Nasal washings and aspirates are unacceptable for Xpert Xpress SARS-CoV-2/FLU/RSV testing.  Fact Sheet for Patients: BloggerCourse.com  Fact Sheet for Healthcare Providers: SeriousBroker.it  This test is not yet approved or cleared by the [place] FDA and has been authorized for detection and/or diagnosis of SARS-CoV-2 by FDA under an Emergency Use Authorization (EUA). This EUA will remain in effect (meaning this test can be used) for the duration of the COVID-19 declaration under Section 564(b)(1) of the Act, 21 U.S.C. section 360bbb-3(b)(1), unless the authorization is terminated or revoked.  Performed at [facility], [address], [place], [place] [address]     Labs:   Lab Results  Component Value Date   WBC 10.0 01/13/2021   HGB 13.3 01/13/2021   HCT 39.0 01/13/2021   MCV 93.4 01/13/2021   PLT 176 01/13/2021    Recent Labs  Lab 01/13/21 2143 01/13/21 2152  NA 128* 129*  K 4.3 4.3  CL 96* 95*  CO2 21*  --   BUN 20 22  CREATININE 1.12 0.90  CALCIUM 8.6*  --   PROT 6.5  --   BILITOT 1.0  --   ALKPHOS 69  --   ALT 72*  --   AST 112*  --   GLUCOSE 219* 210*     Lipid Panel  No results found for: CHOL, TRIG, HDL, CHOLHDL, VLDL, LDLCALC  BNP (last 3 results) Recent Labs    06/15/20 1138 09/19/20 1433 01/13/21 2143  BNP 230.9* 412.1* 347.0*     HEMOGLOBIN A1C No results found for: HGBA1C, MPG  Cardiac Panel (last 3 results) No results for input(s): CKTOTAL, CKMB, RELINDX in the last 8760 hours.  Invalid input(s): TROPONINHS  No results found for: CKTOTAL, CKMB, CKMBINDEX   TSH No results for input(s): TSH in the last 8760 hours.   (Not in a hospital admission)     Current Facility-Administered Medications:    DOPamine (INTROPIN) 800 mg in dextrose 5 % 250 mL (3.2 mg/mL) infusion, 0-20 mcg/kg/min, Intravenous,  Continuous, Kommor, [place], MD, Last Rate: 3.85 mL/hr at 01/14/21 0012, 2.5 mcg/kg/min at 01/14/21 0012  Current Outpatient Medications:    acetaminophen (TYLENOL) 500 MG tablet, Take 1,000 mg by mouth daily as needed (pain)., Disp: , Rfl:    alfuzosin (UROXATRAL) 10 MG 24 hr tablet, Take 10 mg by mouth daily., Disp: , Rfl:    apixaban (ELIQUIS) 5 MG TABS tablet, Take 5 mg by mouth 2 (two) times daily., Disp: , Rfl:    Bioflavonoid Products (VITAMIN C) CHEW, Chew 1 tablet by mouth daily. Gummy, Disp: , Rfl:    BOTOX 100 units SOLR injection, , Disp: , Rfl:    carbidopa-levodopa (SINEMET IR) 25-100 MG tablet, TAKE 1 TABLET BY MOUTH THREE TIMES A DAY, Disp: 270 tablet, Rfl: 1   COD LIVER OIL PO, Take 5 mLs by mouth daily., Disp: , Rfl:    ELDERBERRY PO, Take 100 mg  by mouth daily., Disp: , Rfl:    fexofenadine (ALLEGRA) 180 MG tablet, Take 180 mg by mouth every evening., Disp: , Rfl:    finasteride (PROSCAR) 5 MG tablet, Take 5 mg by mouth daily., Disp: , Rfl: 3   fluocinonide (LIDEX) 0.05 % external solution, Apply 1 application topically See admin instructions. Instil 2 gtts in ear twice weekly, Disp: , Rfl:    folic acid (FOLVITE) 800 MCG tablet, Take 800 mcg by mouth every evening., Disp: , Rfl:    Glucosamine-Chondroitin (COSAMIN DS PO), Take 1 tablet by mouth in the morning and at bedtime., Disp: , Rfl:    hyoscyamine (LEVSIN SL) 0.125 MG SL tablet, Place 0.125 mg under the tongue as needed for cramping. , Disp: , Rfl:    losartan-hydrochlorothiazide (HYZAAR) 50-12.5 MG tablet, Take 1 tablet by mouth every morning., Disp: 90 tablet, Rfl: 1   modafinil (PROVIGIL) 100 MG tablet, TAKE 1.5 TABLETS BY MOUTH DAILY, Disp: 45 tablet, Rfl: 3   Multiple Vitamins-Minerals (PRESERVISION AREDS 2 PO), Take 1 tablet by mouth in the morning and at bedtime., Disp: , Rfl:    pravastatin (PRAVACHOL) 40 MG tablet, Take 40 mg by mouth at bedtime., Disp: , Rfl: 2   Probiotic Product (ALIGN PO), Take 1 capsule by  mouth daily., Disp: , Rfl:    silodosin (RAPAFLO) 8 MG CAPS capsule, Take 8 mg by mouth daily with breakfast., Disp: , Rfl:    Travoprost, BAK Free, (TRAVATAN) 0.004 % SOLN ophthalmic solution, Place 1 drop into both eyes at bedtime., Disp: , Rfl:    vitamin B-12 (CYANOCOBALAMIN) 1000 MCG tablet, Take 1,000 mcg by mouth daily., Disp: , Rfl:    Today's Vitals   01/14/21 0030 01/14/21 0035 01/14/21 0040 01/14/21 0045  BP: 126/72 135/83 (!) 141/82 (!) 144/89  Pulse: (!) 50 64 70 69  Resp: 13 14 14 13   Temp:      TempSrc:      SpO2: 100% 100% 100% 100%  Weight:      Height:       Body mass index is 30.12 kg/m.     CARDIAC STUDIES:  EKG could not be obtained prior to temp wire insertion due to patient instability:  EKG 10/22/2020:  Atypical atrial flutter with 2: 1 conduction, ventricular rate 75 bpm, inferior infarct old, right bundle branch block.  Anterolateral infarct old.  Low-voltage complexes.  Echocardiogram 06/07/2020:  Left ventricle cavity is normal in size and wall thickness. Abnormal  septal wall motion due to left bundle branch block. Normal LV systolic  function with EF 53%. Unable to evaluate diastolic function due to atrial  fibrillation. Calculated EF 53%.  Left atrial cavity is mildly dilated.  Right atrial cavity is moderately dilated.  Mild to moderate mitral regurgitation.  Moderate tricuspid regurgitation. Estimated pulmonary artery systolic  pressure 38 mmHg.  Insignificant pericardial effusion.  Compared to previous study in 2020, mild PH, pericardial effusion are new.  Tricuspid regurgitation has increased from trace to moderate.    Assessment/Plan  87 y.o. Caucasian male  with hypertension, hyperlipidemia, HFpEF, mild PH, persistent Afib, recent loop recorder placement (for clinical trial Lux-Dx TRENDS), presented with recurrent syncope episodes.  Syncope: Tachybradyarrhtymia with sinus node dysfunction. Urgent temporary pacemaker  placement  Due to cath lab and staff being occupied with another critically il patient, this procedure will be performed in the ER.   Not on any AV nodal blocking agents. Will likely need PPM. Will consult EP.  Persistent Afib:  Hold eliiquis. Last dose was 9.18 morning  Hyponatremia: Unclear etiology. Not in florid heart failure. Possibly depletional hyponatremia. Monitor for now.   Troponin elevation: Secondary to severe sinus node dysfunction. Not ACS  CRITICAL CARE Performed by: Truett Mainland   Total critical care time:40 minutes   Critical care time was exclusive of separately billable procedures and treating other patients.   Critical care was necessary to treat or prevent imminent or life-threatening deterioration.   Critical care was time spent personally by me on the following activities: development of treatment plan with patient and/or surrogate as well as nursing, discussions with consultants, evaluation of patient's response to treatment, examination of patient, obtaining history from patient or surrogate, ordering and performing treatments and interventions, ordering and review of laboratory studies, ordering and review of radiographic studies, pulse oximetry and re-evaluation of patient's condition.      Elder Negus, MD Pager: [phone] Office: [phone]

## 2021-01-14 NOTE — ED Notes (Signed)
mA=140, pt having episodes of own heart=60-80bpm w/o pacing, pt tolerating procedure well, unsuccessful through jugular vein, prepping groing at this time

## 2021-01-14 NOTE — ED Notes (Signed)
Pt transported to 2H. 

## 2021-01-14 NOTE — CV Procedure (Signed)
Procedures: 1. Ultrasound guided right IJ and rt common femoral vein access 2. Temporary pacemaker placement 3. Conscious sedation monitoring 60 min  Indication: Syncope Severe bradycardia  History: 87 y.o. Caucasian male  with hypertension, hyperlipidemia, HFpEF, mild PH, persistent Afib, recent loop recorder placement (for clinical trial Lux-Dx TRENDS), presented with recurrent syncope episodes.  RIJ access obtained with micropuncture needle under ultrasound guidance. Temporary pacemaker wire floated under bedside fluoroscopy. However, pacemaker capture was very intermittent due to free floating in RV. Therefore, I changed to Temp-O wire. However, I was not able to advance it into RV.  After >10 min on fluoroscopy, I decided to change site to Rt CFV. Through this access, I was able to place temporary pacer in RV and obtain consistent capture imeediately.   mAMP: 10, capture lost at 1.5 mA Rate: 70 bpm Sheath location: 58 cm at skin  Total fluoro time: 18 min Air Kerma: 500 mGy  All wires and catheters removed out of the body at the end of the procedure. Final angiogram showed no dissection/perforation  Complications: None

## 2021-01-14 NOTE — ED Notes (Signed)
Groin insertion successful. Pt tolerated well.  Transvenous pacer set at MA=10, bpm=70.

## 2021-01-15 ENCOUNTER — Inpatient Hospital Stay (HOSPITAL_COMMUNITY): Payer: Medicare Other

## 2021-01-15 DIAGNOSIS — Z45018 Encounter for adjustment and management of other part of cardiac pacemaker: Secondary | ICD-10-CM

## 2021-01-15 DIAGNOSIS — Z950 Presence of cardiac pacemaker: Secondary | ICD-10-CM | POA: Diagnosis not present

## 2021-01-15 DIAGNOSIS — R001 Bradycardia, unspecified: Secondary | ICD-10-CM

## 2021-01-15 DIAGNOSIS — I442 Atrioventricular block, complete: Secondary | ICD-10-CM | POA: Diagnosis not present

## 2021-01-15 MED ORDER — FUROSEMIDE 10 MG/ML IJ SOLN
20.0000 mg | Freq: Once | INTRAMUSCULAR | Status: AC
  Administered 2021-01-15: 20 mg via INTRAVENOUS
  Filled 2021-01-15: qty 2

## 2021-01-15 MED ORDER — PRAVASTATIN SODIUM 40 MG PO TABS
40.0000 mg | ORAL_TABLET | Freq: Every day | ORAL | Status: DC
  Administered 2021-01-15 – 2021-01-16 (×2): 40 mg via ORAL
  Filled 2021-01-15 (×2): qty 1

## 2021-01-15 MED ORDER — ALFUZOSIN HCL ER 10 MG PO TB24
10.0000 mg | ORAL_TABLET | Freq: Every day | ORAL | Status: DC
  Filled 2021-01-15: qty 1

## 2021-01-15 MED ORDER — POLYETHYLENE GLYCOL 3350 17 G PO PACK: 17.0000 g | PACK | Freq: Every day | ORAL | Status: DC | PRN

## 2021-01-15 MED ORDER — FINASTERIDE 5 MG PO TABS
5.0000 mg | ORAL_TABLET | Freq: Every day | ORAL | Status: DC
  Administered 2021-01-15 – 2021-01-16 (×2): 5 mg via ORAL
  Filled 2021-01-15 (×2): qty 1

## 2021-01-15 MED ORDER — CARBIDOPA-LEVODOPA 25-100 MG PO TABS
1.0000 | ORAL_TABLET | Freq: Three times a day (TID) | ORAL | Status: DC
  Administered 2021-01-15 – 2021-01-16 (×5): 1 via ORAL
  Filled 2021-01-15 (×7): qty 1

## 2021-01-15 MED ORDER — ALFUZOSIN HCL ER 10 MG PO TB24
10.0000 mg | ORAL_TABLET | Freq: Every day | ORAL | Status: DC
  Administered 2021-01-15 – 2021-01-16 (×2): 10 mg via ORAL
  Filled 2021-01-15 (×4): qty 1

## 2021-01-15 MED ORDER — INFLUENZA VAC A&B SA ADJ QUAD 0.5 ML IM PRSY
0.5000 mL | PREFILLED_SYRINGE | INTRAMUSCULAR | Status: DC
  Filled 2021-01-15: qty 0.5

## 2021-01-15 NOTE — Progress Notes (Signed)
Pt bedrest is complete. R groin dressing clean, dry, and intact. No bleeding or hematoma noted. BLE warm, cap refill <3, palpable pulses +1. Pt confirms full sensation, denies numbness or tingling. Will continue to monitor pt.

## 2021-01-15 NOTE — Plan of Care (Signed)
Pt is progressing towards goals

## 2021-01-15 NOTE — Progress Notes (Signed)
Progress Note  Patient Name: Nicholas Robbins Date of Encounter: 01/15/2021  [facility] Cardiologist: Dr. Jacinto Halim  Subjective   No CP, no SOB, minimal site discomfort  Inpatient Medications    Scheduled Meds:  Chlorhexidine Gluconate Cloth  6 each Topical Daily   Continuous Infusions:  PRN Meds: acetaminophen, ondansetron (ZOFRAN) IV   Vital Signs    Vitals:   01/15/21 0400 01/15/21 0500 01/15/21 0600 01/15/21 0700  BP: 102/61 107/68 98/65   Pulse: 70 70 70   Resp: 12 11 12    Temp:    98.2 F (36.8 C)  TempSrc:    Oral  SpO2: 97% 99% 100%   Weight:      Height:        Intake/Output Summary (Last 24 hours) at 01/15/2021 0756 Last data filed at 01/15/2021 0719 Gross per 24 hour  Intake --  Output 2200 ml  Net -2200 ml   Last 3 Weights 01/14/2021 01/13/2021 10/31/2020  Weight (lbs) 182 lb 5.1 oz 181 lb 182 lb 6.4 oz  Weight (kg) 82.7 kg 82.101 kg 82.736 kg      Telemetry    AFib, V paced - Personally Reviewed  ECG    AFib, Vpaced - Personally Reviewed  Physical Exam   GEN: No acute distress.   Neck: No JVD Cardiac: RRR, no murmurs, rubs, or gallops.  Respiratory: CTA b/l. GI: Soft, nontender, non-distended  MS: No edema; No deformity. Neuro:  Nonfocal  Psych: Normal affect   Labs    High Sensitivity Troponin:   Recent Labs  Lab 01/13/21 2143 01/14/21 0418  TROPONINIHS 25* 59*     Chemistry Recent Labs  Lab 01/13/21 2143 01/13/21 2152  NA 128* 129*  K 4.3 4.3  CL 96* 95*  CO2 21*  --   GLUCOSE 219* 210*  BUN 20 22  CREATININE 1.12 0.90  CALCIUM 8.6*  --   PROT 6.5  --   ALBUMIN 3.7  --   AST 112*  --   ALT 72*  --   ALKPHOS 69  --   BILITOT 1.0  --   GFRNONAA >60  --   ANIONGAP 11  --     Lipids No results for input(s): CHOL, TRIG, HDL, LABVLDL, LDLCALC, CHOLHDL in the last 168 hours.  Hematology Recent Labs  Lab 01/13/21 2143 01/13/21 2152  WBC 10.0  --   RBC 3.95*  --   HGB 11.9* 13.3  HCT 36.9* 39.0  MCV 93.4  --    MCH 30.1  --   MCHC 32.2  --   RDW 15.0  --   PLT 176  --    Thyroid  Recent Labs  Lab 01/14/21 0418  TSH 2.762    BNP Recent Labs  Lab 01/13/21 2143  BNP 347.0*    DDimer No results for input(s): DDIMER in the last 168 hours.   Radiology    DG Chest Port 1 View  Result Date: 01/14/2021 CLINICAL DATA:  Temporary transvenous cardiac pacemaker placement. EXAM: PORTABLE CHEST 1 VIEW COMPARISON:  None. FINDINGS: Lungs are clear. No pneumothorax or pleural effusion. Right internal jugular central venous catheter tip overlies the expected innominate vein. Inferiorly approaching trans venous pacemaker lead overlies the expected right ventricular apex. Mild cardiomegaly is present. Pulmonary vascularity is normal. Implanted loop recorder overlies the left hemithorax. No acute bone abnormality. IMPRESSION: Inferiorly approaching trans venous pacemaker lead overlies the expected right ventricle. Mild cardiomegaly. No superimposed pulmonary edema. Electronically Signed  By: Helyn Numbers M.D.   On: 01/14/2021 02:22   DG Fluoro Guide CV Line-No Report  Result Date: 01/14/2021 Fluoroscopy was utilized by the requesting physician.  No radiographic interpretation.    Cardiac Studies   June 07, 2020 echo Normal left ventricular function, 53% Mildly dilated left atrium Moderately dilated right atrium Mild to moderate MR Moderate TR    Patient Profile     87 y.o. male with a hx of OSA w/CPAP, NSVT, HTN, HLD, parkinson's with tremor, and persistent Afib, chronic CHF (diastolic), chronic urinary retention, admitted with rcurrent near syncope and syncope, found in AFib, V rates 20's  >> temp pacing wire >> PPM  Assessment & Plan    Syncope Symptomatic bradycardia No reversible causes  S/p PPM implant yesterday CXR with stable lead position, no ptx Device check this AM with stable measurements Sites are stable Teaching completed Post pacer follow up is in place   3.  Persistent AFib CHA2DS2Vasc is 4, on eliquis out patient, held for PPM HOLD eliquis for 5 days please, OK to resume 01/20/21   4. Chronic CHF (diastolic) Loop implanted 12/03/20 for clinical trial Lux-Dx TRENDS Removed at time of pacer implant and given to industry   Dr. Lalla Brothers has seen the patient this AM EP will sign off though remain available    For questions or updates, please contact [facility] Please consult [email or web address] for contact info under        Signed, Sheilah Pigeon, PA-C  01/15/2021, 7:56 AM

## 2021-01-15 NOTE — Discharge Instructions (Addendum)
    Supplemental Discharge Instructions for  Pacemaker/Defibrillator Patients   Activity No heavy lifting or vigorous activity with your left/right arm for 6 to 8 weeks.  Do not raise your left/right arm above your head for one week.  Gradually raise your affected arm as drawn below.              9.24.22                     01/20/21                   01/21/21                  01/22/21 __  NO DRIVING for  1 week   ; you may begin driving on   0/94/70  .  WOUND CARE Keep the wound area clean and dry.  Do not get this area wet , no showers for one week; you may shower on     . The tape/steri-strips on your wound will fall off; do not pull them off.  No bandage is needed on the site.  DO  NOT apply any creams, oils, or ointments to the wound area. If you notice any drainage or discharge from the wound, any swelling or bruising at the site, or you develop a fever > 101? F after you are discharged home, call the office at once.  Special Instructions You are still able to use cellular telephones; use the ear opposite the side where you have your pacemaker/defibrillator.  Avoid carrying your cellular phone near your device. When traveling through airports, show security personnel your identification card to avoid being screened in the metal detectors.  Ask the security personnel to use the hand wand. Avoid arc welding equipment, MRI testing (magnetic resonance imaging), TENS units (transcutaneous nerve stimulators).  Call the office for questions about other devices. Avoid electrical appliances that are in poor condition or are not properly grounded. Microwave ovens are safe to be near or to operate.  Do not start Eliquis until 01/20/2021

## 2021-01-16 ENCOUNTER — Encounter (HOSPITAL_COMMUNITY): Payer: Self-pay | Admitting: Cardiology

## 2021-01-16 LAB — BASIC METABOLIC PANEL
Anion gap: 10 (ref 5–15)
BUN: 20 mg/dL (ref 8–23)
CO2: 24 mmol/L (ref 22–32)
Calcium: 8.6 mg/dL — ABNORMAL LOW (ref 8.9–10.3)
Chloride: 101 mmol/L (ref 98–111)
Creatinine, Ser: 0.94 mg/dL (ref 0.61–1.24)
GFR, Estimated: >60 mL/min (ref >60)
Glucose, Bld: 93 mg/dL (ref 70–99)
Potassium: 3.7 mmol/L (ref 3.5–5.1)
Sodium: 135 mmol/L (ref 135–145)

## 2021-01-16 MED ORDER — APIXABAN 5 MG PO TABS: 5.0000 mg | ORAL_TABLET | Freq: Two times a day (BID) | ORAL | Status: DC

## 2021-01-16 NOTE — TOC Progression Note (Addendum)
Transition of Care [facility]) - Progression Note    Patient Details  Name: Nicholas Robbins MRN: [id number] Date of Birth: 1928-07-22  Transition of Care [facility]) CM/SW Contact  Leone Haven, RN Phone Number: 01/16/2021, 3:57 PM  Clinical Narrative:    NCM spoke with patient at bedside, he states he wants to do outpatient therapy at [facility] in [place].  [phone],  fax [phone].  NCM contacted [facility]  they will need this NCM to fax information to them , and the patients PCP will have to follow up with [facility] as well.  Patient will have a follow up apt with his PCP next week, awaiting call back from PCP office.  PCP office did not call back. Will have patient to call to set up him an apt for next Tuesday with his PCP. NCM faxed information for outpatient pt to [facility].        Expected Discharge Plan and Services                                                 Social Determinants of Health (SDOH) Interventions    Readmission Risk Interventions No flowsheet data found.

## 2021-01-16 NOTE — Discharge Summary (Signed)
Physician Discharge Summary  Patient ID: Nicholas Robbins MRN: [id number] DOB/AGE: 85-Oct-1930 85 y.o. Rodrigo Ran, MD   Admit date: 01/13/2021 Discharge date:  01/16/2021  Primary Discharge Diagnosis Complete heart block Cardiac syncope Persistent atrial fibrillation Gait instability Acute on chronic diastolic CHF.   Significant Diagnostic Studies:  EKG 01/15/2021: Underlying atypical atrial flutter with ventricularly paced rhythm at the rate of 70 bpm.  No further analysis.  Pacemaker Implantation: [employer] Dual chamber PACEMAKER ACCOLADE DR-EL 01/14/2021  Radiology: DG Chest 2 View  Result Date: 01/15/2021 CLINICAL DATA:  Pacemaker EXAM: CHEST - 2 VIEW COMPARISON:  01/14/2021 FINDINGS: Interval placement of left chest multi lead pacer, leads projecting in the expected vicinity of the right atrial appendage and right ventricle. Unchanged cardiomegaly. Small bilateral pleural effusions. Disc degenerative disease of thoracic spine. IMPRESSION: 1.  Interval placement of left chest multi lead pacer. 2.  Small bilateral pleural effusions. Electronically Signed   By: Lauralyn Primes M.D.   On: 01/15/2021 09:43   DG Chest Port 1 View  Result Date: 01/14/2021 CLINICAL DATA:  Temporary transvenous cardiac pacemaker placement. EXAM: PORTABLE CHEST 1 VIEW COMPARISON:  None. FINDINGS: Lungs are clear. No pneumothorax or pleural effusion. Right internal jugular central venous catheter tip overlies the expected innominate vein. Inferiorly approaching trans venous pacemaker lead overlies the expected right ventricular apex. Mild cardiomegaly is present. Pulmonary vascularity is normal. Implanted loop recorder overlies the left hemithorax. No acute bone abnormality. IMPRESSION: Inferiorly approaching trans venous pacemaker lead overlies the expected right ventricle. Mild cardiomegaly. No superimposed pulmonary edema. Electronically Signed   By: Helyn Numbers M.D.   On: 01/14/2021 02:22   DG  Fluoro Guide CV Line-No Report  Result Date: 01/14/2021 Fluoroscopy was utilized by the requesting physician.  No radiographic interpretation.   CUP PACEART INCLINIC DEVICE CHECK  Result Date: 01/24/2021 Wound check appointment. Steri-strips removed. Wound without redness or edema. Incision edges approximated, wound well healed. Normal device function.  Pt noted in AF today, episode started 01/15/21, chart review reflects Dr. Jacinto Halim aware, DCCV scheduled for 02/12/21.  RV Thresholds, sensing, and RA/RV impedances consistent with implant measurements. Device programmed at 3.5V/auto capture programmed on for extra safety margin until 3 month visit- RA output reprogrammed to fixed 3.5 due to frequent alerts  since pt is in AF.  Histogram distribution appropriate for patient and level of activity. AF Burden 100%, +OAC.  No high ventricular rates noted. Patient educated about wound care, arm mobility, lifting restrictions. ROV with Dr. Lalla Brothers 04/17/21.  Pt is enrolled in remote monitoring, next scheduled check 04/17/21.Judy Pimple, BSN, RN   Hospital Course: ERNAN Robbins is a 87 y.o. male  patient male who is very active and continues to live independently and works at [employer]. 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 Event monitor in January 2020 did not reveal any high degree AV block but had 4 beat NSVT, nuclear stress test on 08/20/2018 was nonischemic.    Patient was seen by me on 10/22/2020, he was enrolled in [employer] clinical trial for heart failure and loop recorder, however developed complete heart block and was admitted to the hospital on 01/13/2021 for complete heart block and underwent dual-chamber pacemaker implantation on 01/14/2021.  Loop recorder was explanted at the same time.   Patient had 2 days of extra length of stay  in view  of gait difficulty, underlying tremors and fall risk.  He was evaluated by physical therapy and felt stable for discharge, initial thought being that patient was being placed in a rehab.  Patient also felt stable to be discharged to his facility with recommendations of outpatient physical therapy.  Patient will be discharged home.  Recommendations on discharge: Patient initially had hyponatremia as well, repeat labs are all within normal limits.  He did have mild fluid overload state which was treated with 1 dose of furosemide.  He was placed back on all his medications prior to discharge and will be followed up in the office as a TOC visit <7 days.  He was also advised regarding holding Eliquis for a total of 5 days post pacemaker implantation.  Discharge Exam: Vitals with BMI 01/23/2021 01/16/2021 01/16/2021  Height 5\' 6"  - -  Weight 173 lbs 6 oz - -  BMI 28 - -  Systolic 125 119  Diastolic 83 78 69  Pulse 77 71 70     Physical Exam Constitutional:      General: He is not in acute distress.    Appearance: Normal appearance.  Eyes:     Extraocular Movements: Extraocular movements intact.  Neck:     Vascular: No carotid bruit or JVD.  Cardiovascular:     Rate and Rhythm: Normal rate and regular rhythm.     Pulses: Intact distal pulses.     Heart sounds: Normal heart sounds. No murmur heard.   No gallop.  Pulmonary:     Effort: Pulmonary effort is normal.     Breath sounds: Normal breath sounds.  Chest:     Comments: Pacemaker/ICD site noted  in the left infraclavicular fossa.    Abdominal:     General: Bowel sounds are normal.     Palpations: Abdomen is soft.  Musculoskeletal:        General: Swelling (Trace bilateral leg edema) present.     Cervical back: Neck supple.  Skin:    Capillary Refill: Capillary refill takes less than 2 seconds.  Neurological:     General: No focal deficit present.     Mental Status: He is alert.     Comments: Tremors coarse noted   Psychiatric:        Mood and Affect: Mood normal.    Labs:   Lab Results  Component Value Date   WBC 10.0 01/13/2021   HGB 13.3 01/13/2021   HCT 39.0 01/13/2021   MCV 93.4 01/13/2021   PLT 176 01/13/2021    No results for input(s): NA, K, CL, CO2, BUN, CREATININE, CALCIUM, PROT, BILITOT, ALKPHOS, ALT, AST, GLUCOSE in the last 168 hours.  Invalid input(s): LABALBU   Lipid Panel  No results found for: CHOL, TRIG, HDL, CHOLHDL, VLDL, LDLCALC  BNP (last 3 results) Recent Labs    06/15/20 1138 09/19/20 1433 01/13/21 2143  BNP 230.9* 412.1* 347.0*    HEMOGLOBIN A1C Lab Results  Component Value Date   HGBA1C 5.6 01/14/2021   MPG 114.02 01/14/2021    Cardiac Panel (last 3 results) No results for input(s): CKTOTAL, CKMB, TROPONINIHS, RELINDX in the last 72 hours.    TSH Recent Labs    01/14/21 0418  TSH 2.762    FOLLOW UP PLANS AND APPOINTMENTS Discharge Instructions     Diet - low sodium heart healthy   Complete by: As directed    Discharge wound care:   Complete by: As directed    Leave  steri strips on until wound care follow up   Increase activity slowly   Complete by: As directed       Allergies as of 01/16/2021   No Known Allergies      Medication List     TAKE these medications    acetaminophen 500 MG tablet Commonly known as: TYLENOL Take 1,000 mg by mouth daily as needed (pain).   alfuzosin 10 MG 24 hr tablet Commonly known as: UROXATRAL Take 10 mg by mouth daily.   ALIGN PO Take 1 capsule by mouth daily.   apixaban 5 MG Tabs tablet Commonly known as: ELIQUIS Take 1 tablet (5 mg total) by mouth 2 (two) times daily.   Botox 100 units Solr injection Generic drug: botulinum toxin Type A Inject 100 Units into the muscle every 3 (three) months.   carbidopa-levodopa 25-100 MG tablet Commonly known as: SINEMET IR TAKE 1 TABLET BY MOUTH THREE TIMES A DAY   COD LIVER OIL PO Take 5 mLs by mouth daily.   COSAMIN DS PO Take 1  tablet by mouth in the morning and at bedtime.   ELDERBERRY PO Take 100 mg by mouth daily.   fexofenadine 180 MG tablet Commonly known as: ALLEGRA Take 180 mg by mouth every evening.   finasteride 5 MG tablet Commonly known as: PROSCAR Take 5 mg by mouth daily.   folic acid 800 MCG tablet Commonly known as: FOLVITE Take 800 mcg by mouth every evening.   hyoscyamine 0.125 MG SL tablet Commonly known as: LEVSIN SL Place 0.125 mg under the tongue as needed for cramping.   Klor-Con M10 10 MEQ tablet Generic drug: potassium chloride Take 10 mEq by mouth daily.   losartan-hydrochlorothiazide 50-12.5 MG tablet Commonly known as: Hyzaar Take 1 tablet by mouth every morning.   modafinil 100 MG tablet Commonly known as: PROVIGIL TAKE 1.5 TABLETS BY MOUTH DAILY What changed: See the new instructions.   multivitamin with minerals Tabs tablet Take 1 tablet by mouth daily. Without iron   pravastatin 40 MG tablet Commonly known as: PRAVACHOL Take 40 mg by mouth at bedtime.   PRESERVISION AREDS 2 PO Take 1 tablet by mouth in the morning and at bedtime.   Travoprost (BAK Free) 0.004 % Soln ophthalmic solution Commonly known as: TRAVATAN Place 1 drop into both eyes at bedtime.   triamcinolone 55 MCG/ACT Aero nasal inhaler Commonly known as: NASACORT Place 2 sprays into the nose at bedtime.   vitamin B-12 1000 MCG tablet Commonly known as: CYANOCOBALAMIN Take 1,000 mcg by mouth daily.   Vitamin C Chew Chew 1 tablet by mouth daily. Gummy               Discharge Care Instructions  (From admission, onward)           Start     Ordered   01/16/21 0000  Discharge wound care:       Comments: Leave steri strips on until wound care follow up   01/16/21 1632            Follow-up Information     [facility] Follow up.   Specialty: Cardiology Why: 01/24/21 @ 4:00PM, wound check visit Contact information: [address] [place] [address] [phone]        Lanier Prude, MD Follow up.   Specialties: Cardiology, Radiology Why: 12/28.22 @ 2:30PM Contact information: [address] [place] [address] [phone]  Rodrigo Ran, MD. Schedule an appointment as soon as possible for a visit on 01/22/2021.   Specialty: Internal Medicine Why: need follow up apt on Tuesday of next week, please call to make your apt Contact information: [address] [place] [address] [phone]         Yates Decamp, MD Follow up on 01/23/2021.   Specialty: Cardiology Why: 01/23/2021 9:15 AM. Layla Maw all medications Contact information: [address] [place] [address] [phone]                  Yates Decamp, MD, [facility] 01/26/2021, 9:10 AM Office: [phone]

## 2021-01-16 NOTE — Progress Notes (Signed)
Subjective:  Nicholas Robbins  is a 87 y.o. 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 Event monitor in January 2020 did not reveal any high degree AV block but had 4 beat NSVT, nuclear stress test on 08/20/2018 was nonischemic.  Here developed acute decompensated diastolic heart failure due to persistent atrial fibrillation and underwent cardioversion on 06/19/2020 but was back in atypical atrial flutter.  He is now admitted to the hospital on 01/13/2009 to with recurrent syncope and bradycardia and complete heart block.  Underwent dual-chamber pacemaker implantation.  No specific complaints this morning, discharge planning is on the hopes of discharging him to outpatient rehab due to unsteady gait and fall risk.  Intake/Output from previous day:  I/O last 3 completed shifts: In: -  Out: 3455 [Urine:3455] No intake/output data recorded.  Blood pressure 128/76, pulse 71, temperature 98 F (36.7 C), temperature source Oral, resp. rate 16, height '5\' 6"'  (1.676 m), weight 77.7 kg, SpO2 98 %.  Physical Exam Constitutional:      General: He is not in acute distress.    Appearance: Normal appearance.  Neck:     Vascular: No carotid bruit or JVD.  Cardiovascular:     Rate and Rhythm: Normal rate and regular rhythm.     Pulses: Intact distal pulses.     Heart sounds: Normal heart sounds. No murmur heard.   No gallop.  Pulmonary:     Effort: Pulmonary effort is normal.     Breath sounds: Normal breath sounds.  Abdominal:     General: Bowel sounds are normal.     Palpations: Abdomen is soft.  Musculoskeletal:        General: Swelling (Trace bilateral leg edema) present.     Cervical back: Neck supple.  Neurological:     Mental Status: He is alert.   Psychiatric:        Mood and Affect: Mood normal.    Lab Results: BMP BNP (last 3 results) Recent Labs    06/15/20 1138 09/19/20 1433 01/13/21 2143  BNP 230.9* 412.1* 347.0*    ProBNP (last 3 results) Recent Labs    01/03/21 1341  PROBNP 2,890*   BMP Latest Ref Rng & Units 01/13/2021 01/13/2021 09/19/2020  Glucose 70 - 99 mg/dL 210(H) 219(H) 110(H)  BUN 8 - 23 mg/dL '22 20 19  ' Creatinine 0.61 - 1.24 mg/dL 0.90 1.12 1.05  BUN/Creat Ratio 10 - 24 - - 18  Sodium 135 - 145 mmol/L 129(L) 128(L) 136  Potassium 3.5 - 5.1 mmol/L 4.3 4.3 3.9  Chloride 98 - 111 mmol/L 95(L) 96(L) 99  CO2 22 - 32 mmol/L - 21(L) 21  Calcium 8.9 - 10.3 mg/dL - 8.6(L) 9.3   Hepatic Function Latest Ref Rng & Units 01/13/2021  Total Protein 6.5 - 8.1 g/dL 6.5  Albumin 3.5 - 5.0 g/dL 3.7  AST 15 - 41 U/L 112(H)  ALT 0 - 44 U/L 72(H)  Alk Phosphatase 38 - 126 U/L 69  Total Bilirubin 0.3 - 1.2 mg/dL 1.0   CBC Latest Ref Rng & Units 01/13/2021 01/13/2021 09/19/2020  WBC 4.0 - 10.5 K/uL - 10.0 9.4  Hemoglobin 13.0 - 17.0 g/dL 13.3 11.9(L) 11.7(L)  Hematocrit 39.0 - 52.0 % 39.0 36.9(L) 35.5(L)  Platelets 150 -  400 K/uL - 176 171   Lipid Panel  No results found for: CHOL, TRIG, HDL, CHOLHDL, VLDL, LDLCALC, LDLDIRECT Cardiac Panel (last 3 results) No results for input(s): CKTOTAL, CKMB, TROPONINI, RELINDX in the last 72 hours.  HEMOGLOBIN A1C Lab Results  Component Value Date   HGBA1C 5.6 01/14/2021   MPG 114.02 01/14/2021   TSH Recent Labs    01/14/21 0418  TSH 2.762   Imaging: CXR PA and LAt View 01/15/2021: 1.  Interval placement of left chest multi lead pacer. 2.  Small bilateral pleural effusions.  Cardiac Studies:  EKG 10/22/2020:  Atypical atrial flutter with 2: 1 conduction, ventricular rate 75 bpm, inferior infarct old, right bundle branch block.  Anterolateral infarct old.  Low-voltage complexes.   Echocardiogram 06/07/2020:  Left ventricle cavity is normal in size and wall  thickness. Abnormal  septal wall motion due to left bundle branch block. Normal LV systolic  function with EF 53%. Unable to evaluate diastolic function due to atrial  fibrillation. Calculated EF 53%.  Left atrial cavity is mildly dilated.  Right atrial cavity is moderately dilated.  Mild to moderate mitral regurgitation.  Moderate tricuspid regurgitation. Estimated pulmonary artery systolic  pressure 38 mmHg.  Insignificant pericardial effusion.  Compared to previous study in 2020, mild PH, pericardial effusion are new.  Tricuspid regurgitation has increased from trace to moderate.   Pacemaker Implantation: [employer] Dual chamber PACEMAKER ACCOLADE DR-EL  EKG 01/15/2021: Underlying atypical atrial flutter with ventricularly paced rhythm at the rate of 70 bpm.  No further analysis.  Scheduled Meds:  alfuzosin  10 mg Oral Q breakfast   carbidopa-levodopa  1 tablet Oral TID   Chlorhexidine Gluconate Cloth  6 each Topical Daily   finasteride  5 mg Oral Daily   influenza vaccine adjuvanted  0.5 mL Intramuscular Tomorrow-1000   pravastatin  40 mg Oral q1800   Continuous Infusions: PRN Meds:.acetaminophen, ondansetron (ZOFRAN) IV, polyethylene glycol  Assessment/Plan:  1.  Recurrent syncope secondary to complete heart block 2.  Status post dual-chamber pacemaker implantation, [employer] 3.  Persistent atypical atrial flutter 4.  Acute on chronic diastolic heart failure 5.  Primary hypertension 6. Hyponatremia probably from acute stress on presentation and CHF.   Recommendation: He received 1 dose of furosemide yesterday with excellent diuresis with improvement in his leg edema.  Chest x-ray revealed bilateral pleural effusion.  He was previously on losartan HCT which has been held due to syncope however will resume this once I obtain BMP.  He is stable from cardiac standpoint, awaiting placement.  Will check BMP to f/u on hyponatremia.  Patient not on Eliquis for now  just called in view of recent pacemaker placement and will resume this in 4 days from now.    Adrian Prows, MD, [facility] 01/16/2021, 7:21 AM Office: [phone] Fax: [phone] Pager: [phone]

## 2021-01-16 NOTE — TOC Transition Note (Signed)
Transition of Care [facility]) - CM/SW Discharge Note   Patient Details  Name: Nicholas Robbins MRN: [id number] Date of Birth: Aug 08, 1928  [facility] CM/SW Contact:  Leone Haven, RN Phone Number: 01/16/2021, 5:08 PM   Clinical Narrative:    NCM spoke with patient at bedside, he states he wants to do outpatient therapy at [facility] in [place].  [phone],  fax [phone].  NCM contacted [facility]  they will need this NCM to fax information to them , and the patients PCP will have to follow up with [facility] as well.  Patient will have a follow up apt with his PCP next week, awaiting call back from PCP office.  PCP office did not call back. Will have patient to call to set up him an apt for next Tuesday with his PCP. NCM faxed information for outpatient pt to [facility].     Final next level of care: Home/Self Care Barriers to Discharge: No Barriers Identified   Patient Goals and CMS Choice Patient states their goals for this hospitalization and ongoing recovery are:: return home with outpatient physical therapy   Choice offered to / list presented to : NA  Discharge Placement                       Discharge Plan and Services                  DME Agency: NA                  Social Determinants of Health (SDOH) Interventions     Readmission Risk Interventions No flowsheet data found.

## 2021-01-16 NOTE — Plan of Care (Signed)

## 2021-01-16 NOTE — Evaluation (Signed)
Physical Therapy Evaluation Patient Details Name: Nicholas Robbins MRN: [id number] DOB: 03-02-1929 Today's Date: 01/16/2021  History of Present Illness  Pt is a 87 y.o. male admitted 01/13/21 with recurrent syncope, bradycardia, complete heart block. S/p dual-chamber pacemaker implant 9/19. PMH includes OSA, HTN, Parkinson's disease, afib, CHF, NSVT, chronic urinary retention.   Clinical Impression  Pt presents with an overall decrease in functional mobility secondary to above. PTA, pt mod indep with rollator; [facility] where he also works. Educ on precautions, activity recommendations and importance of mobility. Today, pt able to mobilize with rollator and supervision for safety; denies dizziness with activity. Pt hopeful for d/c soon, plans to have friend stay with him first few nights. If to remain admitted, will follow acutely to address established goals.    Recommendations for follow up therapy are one component of a multi-disciplinary discharge planning process, led by the attending physician.  Recommendations may be updated based on patient status, additional functional criteria and insurance authorization.  Follow Up Recommendations Outpatient PT;Supervision - Intermittent    Equipment Recommendations  None recommended by PT    Recommendations for Other Services       Precautions / Restrictions Precautions Precautions: Fall;ICD/Pacemaker      Mobility  Bed Mobility Overal bed mobility: Modified Independent             General bed mobility comments: Increased time and effort; reports he sleeps in recliner    Transfers Overall transfer level: Needs assistance Equipment used: 4-wheeled walker Transfers: Sit to/from Stand Sit to Stand: Supervision         General transfer comment: Cues to lock rollator brakes, supervision for safety first time up  Ambulation/Gait Ambulation/Gait assistance: Supervision Gait Distance (Feet): 400 Feet Assistive  device: 4-wheeled walker Gait Pattern/deviations: Step-through pattern;Decreased stride length;Trunk flexed Gait velocity: Decreased   General Gait Details: Slow, steady gait with rollator; supervision for safety due to first time up; cues to maintain closer proximity to RW, cues for activity pacing; pt denies dizziness  Stairs            Wheelchair Mobility    Modified Rankin (Stroke Patients Only)       Balance Overall balance assessment: Needs assistance Sitting-balance support: No upper extremity supported Sitting balance-Leahy Scale: Good     Standing balance support: Single extremity supported;Bilateral upper extremity supported;No upper extremity supported;During functional activity Standing balance-Leahy Scale: Fair Standing balance comment: can static stand to void in toilet without UE support; static and dynamic stability improved with UE support                             Pertinent Vitals/Pain Pain Assessment: Faces Faces Pain Scale: Hurts a little bit Pain Location: Chronic low back pain Pain Descriptors / Indicators: Discomfort Pain Intervention(s): Monitored during session    Home Living Family/patient expects to be discharged to:: Private residence Living Arrangements: Alone Available Help at Discharge: Friend(s);Available PRN/intermittently Type of Home: Other(Comment) (resident at [facility]. [facility]) Home Access: Level entry     Home Layout: One level Home Equipment: Dan Humphreys - 4 wheels Additional Comments: Pt has his own room/bathroom at [facility]. [facility] in [place], [place] - works there as well; retired [employer]    Prior Function Level of Independence: Independent with assistive device(s)         Comments: Mod indep with rollator; meals provided; sleeps in recliner  Hand Dominance        Extremity/Trunk Assessment   Upper Extremity Assessment Upper Extremity Assessment: Generalized  weakness;LUE deficits/detail LUE Deficits / Details: reports baseline LUE tremor due to Parkinson's    Lower Extremity Assessment Lower Extremity Assessment: Generalized weakness    Cervical / Trunk Assessment Cervical / Trunk Assessment: Kyphotic;Other exceptions Cervical / Trunk Exceptions: scoliotic  Communication   Communication: No difficulties  Cognition Arousal/Alertness: Awake/alert Behavior During Therapy: WFL for tasks assessed/performed Overall Cognitive Status: Within Functional Limits for tasks assessed                                        General Comments General comments (skin integrity, edema, etc.): Educ re: pacemaker precautions (sling for comfort today if needed), activity recommendations, outpatient PT for chronic back issues/pain    Exercises     Assessment/Plan    PT Assessment Patient needs continued PT services  PT Problem List Decreased strength;Decreased activity tolerance;Decreased balance;Decreased mobility;Decreased knowledge of precautions;Cardiopulmonary status limiting activity       PT Treatment Interventions Gait training;DME instruction;Functional mobility training;Therapeutic activities;Therapeutic exercise;Balance training;Patient/family education    PT Goals (Current goals can be found in the Care Plan section)  Acute Rehab PT Goals Patient Stated Goal: Return home, "I'll try to make accomodations for a friend to stay with me overnight" PT Goal Formulation: With patient Time For Goal Achievement: 01/30/21 Potential to Achieve Goals: Good    Frequency Min 3X/week   Barriers to discharge Decreased caregiver support      Co-evaluation               AM-PAC PT "6 Clicks" Mobility  Outcome Measure Help needed turning from your back to your side while in a flat bed without using bedrails?: None Help needed moving from lying on your back to sitting on the side of a flat bed without using bedrails?: None Help  needed moving to and from a bed to a chair (including a wheelchair)?: A Little Help needed standing up from a chair using your arms (e.g., wheelchair or bedside chair)?: A Little Help needed to walk in hospital room?: A Little Help needed climbing 3-5 steps with a railing? : A Little 6 Click Score: 20    End of Session Equipment Utilized During Treatment: Gait belt Activity Tolerance: Patient tolerated treatment well Patient left: in chair;with call bell/phone within reach Nurse Communication: Mobility status PT Visit Diagnosis: Other abnormalities of gait and mobility (R26.89);Muscle weakness (generalized) (M62.81)    Time: 6295-2841 PT Time Calculation (min) (ACUTE ONLY): 29 min   Charges:   PT Evaluation $PT Eval Moderate Complexity: 1 Mod PT Treatments $Gait Training: 8-22 mins      Ina Homes, PT, DPT Acute Rehabilitation Services  Pager [phone] Office [phone]  Malachy Chamber 01/16/2021, 11:31 AM

## 2021-01-18 ENCOUNTER — Telehealth: Payer: Self-pay

## 2021-01-18 NOTE — Telephone Encounter (Signed)
Location of hospitalization: [place] Reason for hospitalization:  Date of discharge:  Date of first communication with patient: today Person contacting patient: Me Current symptoms:  Do you understand why you were in the Hospital: Yes Questions regarding discharge instructions: None Where were you discharged to: Home Medications reviewed: Yes Allergies reviewed: Yes Dietary changes reviewed: Yes. Discussed low fat and low salt diet.  Referals reviewed: NA Activities of Daily Living: Able to with mild limitations Any transportation issues/concerns: None Any patient concerns: None Confirmed importance & date/time of Follow up appt: Yes Confirmed with patient if condition begins to worsen call. Pt was given the office number and encouraged to call back with questions or concerns: Yes   Attempted to call pt, no answer. Left vm requesting call back. 

## 2021-01-23 ENCOUNTER — Encounter: Payer: Self-pay | Admitting: Cardiology

## 2021-01-23 ENCOUNTER — Other Ambulatory Visit: Payer: Self-pay

## 2021-01-23 ENCOUNTER — Ambulatory Visit: Payer: Medicare Other | Admitting: Cardiology

## 2021-01-23 VITALS — BP 125/83 | HR 77 | Ht 66.0 in | Wt 173.4 lb

## 2021-01-23 DIAGNOSIS — I4819 Other persistent atrial fibrillation: Secondary | ICD-10-CM

## 2021-01-23 DIAGNOSIS — Z950 Presence of cardiac pacemaker: Secondary | ICD-10-CM

## 2021-01-23 DIAGNOSIS — I484 Atypical atrial flutter: Secondary | ICD-10-CM

## 2021-01-23 DIAGNOSIS — I442 Atrioventricular block, complete: Secondary | ICD-10-CM

## 2021-01-23 NOTE — Progress Notes (Signed)
Primary Physician/Referring:  Rodrigo Ran, MD  Patient ID: [name], male    DOB: [birth year], 87 y.o.   MRN: [id number]  Chief Complaint  Patient presents with   Atrial Flutter   Follow-up   HPI:    KEYTON BHAT  is a 87 y.o. male who is very active and continues to live independently and works at [employer]. 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 Event monitor in January 2020 did not reveal any high degree AV block but had 4 beat NSVT, nuclear stress test on 08/20/2018 was nonischemic.   Patient was seen by me on 10/22/2020, he was enrolled in [employer] clinical trial for heart failure and loop recorder, however developed complete heart block and was admitted to the hospital on 01/13/2021 for complete heart block and underwent dual-chamber pacemaker implantation on 01/14/2021.  Loop recorder was explanted at the same time.  He had a 4-day hospital stay and was discharged home and he now presents for follow-up.  Past Medical History:  Diagnosis Date   Arthritis    Asthma    Benign essential tremor    BPH (benign prostatic hyperplasia)    Central serous retinopathy    COAG (chronic open-angle glaucoma)    Colon polyps    Diverticulitis    Essential hypertension 06/22/2018   Glaucoma    Heart block AV complete (HCC)    Hypercholesterolemia 06/22/2018   Hyperlipidemia    Hypertension    Macular degeneration    Macular hole    NSVT (nonsustained ventricular tachycardia) (HCC) 06/22/2018   Event Monitor 30 days 05/10/2018: Performed for syncope Predominant rhythm is sinus rhythm with first-degree AV block. No symptoms reported. One 4 beat VT at 8:46PM, Occasional PVCs.   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   Past Surgical History:  Procedure Laterality Date   BACK SURGERY     lumbar fusion   broken ankle  1961   CARDIOVERSION N/A 06/19/2020   Procedure: CARDIOVERSION;  Surgeon: Yates Decamp, MD;  Location[facility] ENDOSCOPY;  Service: Cardiovascular;  Laterality: N/A;   CARPAL TUNNEL RELEASE Right 10/15/2017   Procedure: RIGHT CARPAL TUNNEL RELEASE;  Surgeon: Cindee Salt, MD;  Location: [facility];  Service: Orthopedics;  Laterality: Right;   CATARACT EXTRACTION, BILATERAL     HERNIA REPAIR     PACEMAKER IMPLANT N/A 01/14/2021   Procedure: PACEMAKER IMPLANT;  Surgeon: Lanier Prude, MD;  Location: [facility] INVASIVE CV LAB;  Service: Cardiovascular;  Laterality: N/A;   REPLACEMENT TOTAL KNEE  2005   VITRECTOMY Left 2006   Family History  Problem Relation Age of Onset   Hypertension Mother    Stroke Mother    Ulcers Father 33       bleeding/ deceased   Lymphoma Brother        cancer   Breast cancer Sister    Colon cancer Neg Hx    Stomach cancer Neg Hx    Rectal cancer Neg Hx    Esophageal cancer Neg Hx    Liver cancer Neg Hx     Social History   Tobacco Use  Smoking status: Never   Smokeless tobacco: Never  Substance Use Topics   Alcohol use: Yes    Alcohol/week: 0.0 standard drinks    Comment: occasional   Marital Status: Single  ROS  Review of Systems  Constitutional: Negative for malaise/fatigue.  Cardiovascular:  Positive for dyspnea on exertion. Negative for leg swelling, palpitations and syncope.  Musculoskeletal:  Positive for back pain.  Gastrointestinal:  Negative for melena.  Neurological:  Positive for focal weakness and tremors.  Objective  Blood pressure 125/83, pulse 77, height 5\' 6"  (1.676 m), weight 173 lb 6.4 oz (78.7 kg), SpO2 100 %.  Vitals with BMI 01/23/2021 01/16/2021 01/16/2021  Height 5\' 6"  - -  Weight 173 lbs 6 oz - -  BMI 28 - -  Systolic 125 119 01/18/2021   Diastolic 83 78 69  Pulse 77 71 70     Physical Exam Constitutional:      General: He is not in acute distress.    Appearance: He is well-developed.     Comments: Mildly oebese  Neck:     Vascular: No carotid bruit or JVD.  Cardiovascular:     Rate and Rhythm: Normal rate and regular rhythm. No extrasystoles are present.    Pulses: Intact distal pulses.     Heart sounds: No murmur heard.   No gallop.  Pulmonary:     Effort: Pulmonary effort is normal. No accessory muscle usage.     Breath sounds: Normal breath sounds.  Chest:     Comments: Pacemaker/ICD site noted  in the left infraclavicular fossa.  Abdominal:     General: Bowel sounds are normal.     Palpations: Abdomen is soft.  Musculoskeletal:     Right lower leg: Edema (trace) present.     Left lower leg: No edema.   Laboratory examination:   CMP Latest Ref Rng & Units 01/16/2021 01/13/2021 01/13/2021  Glucose 70 - 99 mg/dL 93 01/15/2021) 01/15/2021)  BUN 8 - 23 mg/dL 20 22 20   Creatinine 0.61 - 1.24 mg/dL 562(Z 308(M  Sodium 135 - 145 mmol/L 135 129(L) 128(L)  Potassium 3.5 - 5.1 mmol/L 3.7 4.3 4.3  Chloride 98 - 111 mmol/L 101 95(L) 96(L)  CO2 22 - 32 mmol/L 24 - 21(L)  Calcium 8.9 - 10.3 mg/dL 5.78) - 8.6(L)  Total Protein 6.5 - 8.1 g/dL - - 6.5  Total Bilirubin 0.3 - 1.2 mg/dL - - 1.0  Alkaline Phos 38 - 126 U/L - - 69  AST 15 - 41 U/L - - 112(H)  ALT 0 - 44 U/L - - 72(H)   CBC Latest Ref Rng & Units 01/13/2021 01/13/2021 09/19/2020  WBC 4.0 - 10.5 K/uL - 10.0 9.4  Hemoglobin 13.0 - 17.0 g/dL 01/15/2021 11.9(L) 11.7(L)  Hematocrit 39.0 - 52.0 % 39.0 36.9(L) 35.5(L)  Platelets 150 - 400 K/uL - 176 171   Lipid Panel  No results found for: CHOL, TRIG, HDL, CHOLHDL, VLDL, LDLCALC, LDLDIRECT HEMOGLOBIN A1C Lab Results  Component Value Date   HGBA1C 5.6 01/14/2021   MPG 114.02 01/14/2021   TSH Recent Labs    01/14/21 0418  TSH 2.762     External labs:   Lab 05/22/2020:  Hb 11.6/HCT 36.7, platelets not  reported.  Normal indicis. Cholesterol, total 159.000 m 11/03/2019 HDL 53 MG/DL 01/16/21 LDL 05/24/2020 mg 11/03/2019 Triglycerides 106.000 11/03/2019  Hemoglobin 12.400 g/d 11/03/2019  Creatinine, Serum 1.100 mg/ 11/03/2019 ALT (SGPT) 12.000 uni 11/03/2019 TSH 2.370 11/03/2019  Na/K 139/4.7  07/14/2019  Cholesterol, total 178.000 07/14/2019 Triglycerides 251.000 07/14/2019 HDL 39.000 07/14/2019 LDL-C 96.000 07/14/2019  BUN 22.000 07/14/2019 Creatinine, Serum 1.210 07/14/2019  A1C 5.700 07/14/2019  Glucose Random 117.000 07/14/2019  MicroAlbumin Urine 23.950 09/29/2018 MicroAlbumin/Creat 208.300 09/29/2018  PSA 7.850 06/17/2016  Medications and allergies  No Known Allergies   Current Outpatient Medications on File Prior to Visit  Medication Sig Dispense Refill   acetaminophen (TYLENOL) 500 MG tablet Take 1,000 mg by mouth daily as needed (pain).     alfuzosin (UROXATRAL) 10 MG 24 hr tablet Take 10 mg by mouth daily.     apixaban (ELIQUIS) 5 MG TABS tablet Take 1 tablet (5 mg total) by mouth 2 (two) times daily. 60 tablet    Bioflavonoid Products (VITAMIN C) CHEW Chew 1 tablet by mouth daily. Gummy     BOTOX 100 units SOLR injection Inject 100 Units into the muscle every 3 (three) months.     carbidopa-levodopa (SINEMET IR) 25-100 MG tablet TAKE 1 TABLET BY MOUTH THREE TIMES A DAY (Patient taking differently: Take 1 tablet by mouth 3 (three) times daily.) 270 tablet 1   COD LIVER OIL PO Take 5 mLs by mouth daily.     ELDERBERRY PO Take 100 mg by mouth daily.     fexofenadine (ALLEGRA) 180 MG tablet Take 180 mg by mouth every evening.     finasteride (PROSCAR) 5 MG tablet Take 5 mg by mouth daily.  3   folic acid (FOLVITE) 800 MCG tablet Take 800 mcg by mouth every evening.     Glucosamine-Chondroitin (COSAMIN DS PO) Take 1 tablet by mouth in the morning and at bedtime.     hyoscyamine (LEVSIN SL) 0.125 MG SL tablet Place 0.125 mg under the tongue as needed for cramping.  (Patient not taking: Reported  on 01/14/2021)     KLOR-CON M10 10 MEQ tablet Take 10 mEq by mouth daily.     losartan-hydrochlorothiazide (HYZAAR) 50-12.5 MG tablet Take 1 tablet by mouth every morning. 90 tablet 1   modafinil (PROVIGIL) 100 MG tablet TAKE 1.5 TABLETS BY MOUTH DAILY (Patient taking differently: Take 150 mg by mouth daily as needed (to prevent sleep while driving).) 45 tablet 3   Multiple Vitamin (MULTIVITAMIN WITH MINERALS) TABS tablet Take 1 tablet by mouth daily. Without iron     Multiple Vitamins-Minerals (PRESERVISION AREDS 2 PO) Take 1 tablet by mouth in the morning and at bedtime.     pravastatin (PRAVACHOL) 40 MG tablet Take 40 mg by mouth at bedtime.  2   Probiotic Product (ALIGN PO) Take 1 capsule by mouth daily.     Travoprost, BAK Free, (TRAVATAN) 0.004 % SOLN ophthalmic solution Place 1 drop into both eyes at bedtime.     triamcinolone (NASACORT) 55 MCG/ACT AERO nasal inhaler Place 2 sprays into the nose at bedtime.     vitamin B-12 (CYANOCOBALAMIN) 1000 MCG tablet Take 1,000 mcg by mouth daily.     No current facility-administered medications on file prior to visit.    Radiology:   Chest x-ray PA and lateral view 05/28/2020:  Heart size is normal, persistent bilateral pleural effusion at the lung bases compared to 05/22/2020 no change. Mild thoracic spondylosis.  Hyperinflation. Impression: Pneumonia versus minimal congestive heart failure, hyperinflation consistent with COPD.  Cardiac Studies:   Sleep study 03/31/2014 Positive for Sleep Apnea, follows Dr. Casilda Carls myoview 06/16/2014: SPECT images demonstrate a moderate-sized mild ischemia extending from the base towards the apex in the septal wall. Left ventricle systolic  function was Calculated at 50%. There was mild septal hypokinesis. This represents an intermediate risk scan.  Event Monitor 30 days 05/10/2018:  Predominant rhythm is sinus rhythm with first-degree AV block. No symptoms reported. One 4 beat VT at 8:46PM,  Occasional PVCs.  Lexiscan myoview stress test 06/21/2018:  1. Lexiscan stress test was performed. Exercise capacity was not assessed. No stress symptoms reported. Resting blood pressure was 136/72 mmHg and peak effect blood pressure was 140/70 mmHg. The resting and stress electrocardiogram demonstrated normal sinus rhythm, RBBB + LAHB, occasional PVC and normal rest repolarization.  Stress EKG is non diagnostic for ischemia as it is a pharmacologic stress.  2. The overall quality of the study is good. There is no evidence of abnormal lung activity. Stress and rest SPECT images demonstrate homogeneous tracer distribution throughout the myocardium. Gated SPECT imaging reveals mild global decrease in myocardial thickening and wall motion. The left ventricular ejection fraction was normal (46%).   3. High risk study due to reduced LVEF. No evidence of ischemia/ infarction.   Echocardiogram 06/07/2020:  Left ventricle cavity is normal in size and wall thickness. Abnormal  septal wall motion due to left bundle branch block. Normal LV systolic  function with EF 53%. Unable to evaluate diastolic function due to atrial  fibrillation. Calculated EF 53%.  Left atrial cavity is mildly dilated.  Right atrial cavity is moderately dilated.  Mild to moderate mitral regurgitation.  Moderate tricuspid regurgitation. Estimated pulmonary artery systolic  pressure 38 mmHg.  Insignificant pericardial effusion.  Compared to previous study in 2020, mild PH, pericardial effusion are new.  Tricuspid regurgitation has increased from trace to moderate.    Pacemaker Implantation: Device Clinic: [employer] Dual chamber PACEMAKER  01/14/2021    EKG  EKG 01/13/2021: Atrial fibrillation with controlled ventricular response, ventricularly paced rhythm at rate of 71 bpm.  No further analysis.  EKG 09/13/2020: Atypical atrial flutter with variable AV conduction at rate of 76 bpm.  Left axis deviation, left anterior  fascicular block.  Right bundle branch block. Poor R progression, CRO anterolateral infarct old.  PVCs (2).  No significant change from 07/13/2020.   EKG 06/15/2020: Atrial fibrillation with controlled ventricular response at the rate of 94 bpm, left axis deviation, left anterior fascicular block.  Right bundle branch block.  Poor R wave progression, cannot exclude anterolateral infarct old.  Normal QT interval.  Compared to 05/31/2019, atypical atrial flutter.  Otherwise no significant change.   EKG 04/03/2020: Sinus rhythm with first-degree block at rate of 77 bpm, left axis deviation, left intrafascicular block.  Right bundle branch block.  Trifascicular block.  Poor R wave progression, cannot exclude anterolateral infarct old.  Unspecific T abnormality.   No significant change from EKG 09/19/2019,  03/17/2019.   Assessment     ICD-10-CM   1. Atypical atrial flutter (HCC)  I48.4 EKG 12-Lead    2. Persistent atrial fibrillation (HCC)  I48.19     3. Heart block AV complete (HCC)  I44.2     4. Pacemaker [employer] Dual chamber PACEMAKER ACCOLADE DR-EL 01/14/2021  Z95.0       CHA2DS2-VASc Score is 4.  Yearly risk of stroke: 4.8% (A, HTN, Coronary atherosclerosis).  Score of 1=0.6; 2=2.2; 3=3.2; 4=4.8; 5=7.2; 6=9.8; 7=>9.8) -(CHF; HTN; vasc disease DM,  Male = 1; Age <65 =0; 65-74 = 1,  >75 =2; stroke/embolism= 2).    No orders of the defined types were placed in this encounter.   There are no discontinued  medications.   Recommendations:   Mr. Dasean Brow is a fairly active 87 y.o. male who is very active and continues to live independently and works at [employer]. 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 Event monitor in January 2020 did not reveal any high degree AV block but had 4 beat NSVT, nuclear stress test on  08/20/2018 was nonischemic.   IPatient was seen by me on 10/22/2020, he was enrolled in [employer] clinical trial for heart failure and loop recorder, however developed complete heart block and was admitted to the hospital on 01/13/2021 for complete heart block and underwent dual-chamber pacemaker implantation on 01/14/2021.  Loop recorder was explanted at the same time.  He had a 4-day hospital stay and was discharged home and he now presents for follow-up.  He is presently doing well, pacemaker site has healed well without any hematoma or complications or discharge.  He is now back on Eliquis started 3 days ago.  He has not had any fall, no dizziness.  Blood pressure is well controlled.  EKG reveals persistent atrial fibrillation.  Now that he has a pacemaker, previously he had trifascicular block, we were inhibited in using medications for rhythm control.  I will consider starting him on amiodarone, will discuss with Dr. Lalla Brothers and if he agrees, we will also schedule him for direct-current cardioversion in 3 weeks from today and I will see him back in 2 months.  All questions answered, all the medications from the hospitalization reviewed.   Yates Decamp, MD, [facility] 01/23/2021, 9:49 AM Office: [phone] Pager: [phone]

## 2021-01-24 ENCOUNTER — Ambulatory Visit (INDEPENDENT_AMBULATORY_CARE_PROVIDER_SITE_OTHER): Payer: Medicare Other

## 2021-01-24 DIAGNOSIS — R001 Bradycardia, unspecified: Secondary | ICD-10-CM | POA: Diagnosis not present

## 2021-01-24 LAB — CUP PACEART INCLINIC DEVICE CHECK
Brady Statistic RA Percent Paced: 0 %
Brady Statistic RV Percent Paced: 94 %
Date Time Interrogation Session: 20220929155130
Implantable Lead Implant Date: 20220919
Implantable Lead Implant Date: 20220919
Implantable Lead Location: 753859
Implantable Lead Location: 753860
Implantable Lead Model: 7841
Implantable Lead Model: 7842
Implantable Lead Serial Number: 1099578
Implantable Lead Serial Number: 1174492
Implantable Pulse Generator Implant Date: 20220919
Lead Channel Impedance Value: 664 Ohm
Lead Channel Impedance Value: 788 Ohm
Lead Channel Pacing Threshold Amplitude: 0.9 V
Lead Channel Pacing Threshold Pulse Width: 0.4 ms
Lead Channel Sensing Intrinsic Amplitude: 2.1 mV
Lead Channel Setting Pacing Amplitude: 3.5 V
Lead Channel Setting Pacing Amplitude: 3.5 V
Lead Channel Setting Pacing Pulse Width: 0.4 ms
Lead Channel Setting Sensing Sensitivity: 3 mV
Pulse Gen Serial Number: 994178

## 2021-01-24 NOTE — Progress Notes (Signed)
Wound check appointment. Steri-strips removed. Wound without redness or edema. Incision edges approximated, wound well healed. Normal device function.  Pt noted in AF today, episode started 01/15/21, chart review reflects Dr. Jacinto Halim aware, DCCV scheduled for 02/12/21.  RV Thresholds, sensing, and RA/RV impedances consistent with implant measurements. Device programmed at 3.5V/auto capture programmed on for extra safety margin until 3 month visit- RA output reprogrammed to fixed 3.5 due to frequent alerts since pt is in AF.  Histogram distribution appropriate for patient and level of activity. AF Burden 100%, +OAC.  No high ventricular rates noted. Patient educated about wound care, arm mobility, lifting restrictions. ROV with Dr. Lalla Brothers 04/17/21.  Pt is enrolled in remote monitoring, next scheduled check 04/17/21.  Harlow Ohms

## 2021-01-24 NOTE — Patient Instructions (Signed)

## 2021-02-01 ENCOUNTER — Encounter (HOSPITAL_COMMUNITY): Payer: Self-pay | Admitting: Cardiology

## 2021-02-01 NOTE — Progress Notes (Signed)
Attempted to obtain medical history via telephone, unable to reach at this time. I left a voicemail to return pre surgical testing department's phone call.  

## 2021-02-06 ENCOUNTER — Ambulatory Visit: Payer: Medicare Other | Admitting: Neurology

## 2021-02-06 ENCOUNTER — Encounter: Payer: Self-pay | Admitting: Neurology

## 2021-02-06 ENCOUNTER — Other Ambulatory Visit: Payer: Self-pay

## 2021-02-06 VITALS — BP 144/71 | HR 71 | Ht 66.0 in | Wt 168.0 lb

## 2021-02-06 DIAGNOSIS — G20 Parkinson's disease: Secondary | ICD-10-CM

## 2021-02-06 DIAGNOSIS — K117 Disturbances of salivary secretion: Secondary | ICD-10-CM | POA: Diagnosis not present

## 2021-02-06 NOTE — Progress Notes (Signed)
Chief Complaint  Patient presents with   Procedure      ASSESSMENT AND PLAN  Nicholas RADEMAKER is a 87 y.o. male   Excessive sialorrhea  I have suggested him to drink small sips of water frequently, suck on hard candy to stimulate swallowing reflex  Botox 100 units today, dissolved into 2 cc of normal saline, first injection on October 31, 2020, responded very well Repeat injection today  50 units each side, dosage was divided along parotid gland and submandibular gland with multiple injection sites   Idiopathic Parkinson's disease  He has not been persistent with his Sinemet 25/100 mg 3 times daily, likely he will do better taking his medicine more regularly, 6 AM, 12, 6 PM  Gait abnormalities  Multifactorial, Parkinson's disease, significant scoliosis, joints pain   DIAGNOSTIC DATA (LABS, IMAGING, TESTING) - I reviewed patient records, labs, notes, testing and imaging myself where available.   HISTORICAL  Nicholas Robbins, is a 87 year old male, seen in request by nurse practitioner Everlene Other for evaluation of Botox injection to control his excessive sialorrhea, his primary care physician is Dr. Rodrigo Ran, I saw him initially on October 31, 2020  I reviewed and summarized the referring note.PMHX.  HTN HLD. Atrial Fibrillation, on Eliquis 5mg  bid, see Dr. Parkinson's disease  Using CPAP, good compliance  He is a retired [employer], lives at retreatment center in [facility], drove himself to clinic today,  He was diagnosed with Parkinson's disease around 2018, presented with left hand tremor, difficulty writing, voice change, increased gait abnormality, he was started on Sinemet 25/100 mg, reported missing his second dose about 50% of the time  He has very regular daily routine, get up at 5:15 AM, have breakfast at 8 AM, lunch at 12, go to bed at 10:15 PM, he tried to take his medications 2 hours before each meal, supposed to take his Sinemet 25/100 at  6 AM, 10 AM, 2 PM, but he often got so busy in the middle of the day, missing his 10 AM dose, he did not notice any significant difference of his left hand tremor taking or not taking Sinemet  He has significant gait abnormality using walker for longer distances, cane for short distance, due to scoliosis and Parkinson's  He began to noticed excessive drooling since 2021, he described it is less bothersome if he is talking or eating, but when he is relaxing, he has to hold his handkerchief all the time, oftentimes woke up from sleep felt his mouth is full of saliva, he denies dysphagia, no choking episode, sleep well, no significant bowel bladder difficulties.  Update February 06, 2021: He responded well to previous Botox injection for hypersialorrhea, only recent few weeks, he noticed increased drooling, denies significant side effect, he is now taking Sinemet 25/100 at 6, a.m., 10 AM, 2 PM  PHYSICAL EXAM:   Vitals:   02/06/21 1523  BP: (!) 144/71  Pulse: 71  Weight: 168 lb (76.2 kg)  Height: 5\' 6"  (1.676 m)   Not recorded     Body mass index is 27.12 kg/m.  PHYSICAL EXAMNIATION:  Gen: NAD, conversant, well nourised, well groomed             NEUROLOGICAL EXAM:  MENTAL STATUS: Speech/cognition: Awake, alert, oriented to history taking and casual conversation   CRANIAL NERVES: CN II: Visual fields are full to confrontation. Pupils are round equal and briskly reactive to light. CN III, IV, VI: extraocular movement  are normal. No ptosis. CN V: Facial sensation is intact to light touch CN VII: Face is symmetric with normal eye closure  CN VIII: Hearing is normal to causal conversation. CN IX, X: Phonation is normal. CN XI: Head turning and shoulder shrug are intact  MOTOR: Left hand resting tremor, left more than right mild to moderate rigidity, bradykinesia  REFLEXES: Hypoactive  SENSORY: Intact to light touch,   COORDINATION: There is no trunk or limb dysmetria  noted.  GAIT/STANCE: Need push-up to get up from seated position, rely on his cane, severe scoliosis, unsteady  REVIEW OF SYSTEMS:  Full 14 system review of systems performed and notable only for as above All other review of systems were negative.   ALLERGIES: No Known Allergies  HOME MEDICATIONS: Current Outpatient Medications  Medication Sig Dispense Refill   acetaminophen (TYLENOL) 500 MG tablet Take 1,000 mg by mouth daily as needed (pain).     alfuzosin (UROXATRAL) 10 MG 24 hr tablet Take 10 mg by mouth daily.     apixaban (ELIQUIS) 5 MG TABS tablet Take 1 tablet (5 mg total) by mouth 2 (two) times daily. 60 tablet    Bioflavonoid Products (VITAMIN C) CHEW Chew 1 tablet by mouth daily. Gummy     BOTOX 100 units SOLR injection Inject 100 Units into the muscle every 3 (three) months.     carbidopa-levodopa (SINEMET IR) 25-100 MG tablet TAKE 1 TABLET BY MOUTH THREE TIMES A DAY (Patient taking differently: Take 1 tablet by mouth 3 (three) times daily.) 270 tablet 1   COD LIVER OIL PO Take 5 mLs by mouth daily.     ELDERBERRY PO Take 100 mg by mouth daily.     fexofenadine (ALLEGRA) 180 MG tablet Take 180 mg by mouth every evening.     finasteride (PROSCAR) 5 MG tablet Take 5 mg by mouth daily.  3   folic acid (FOLVITE) 800 MCG tablet Take 800 mcg by mouth every evening.     Glucosamine-Chondroitin (COSAMIN DS PO) Take 1 tablet by mouth in the morning and at bedtime.     hyoscyamine (LEVSIN SL) 0.125 MG SL tablet Place 0.125 mg under the tongue as needed for cramping.     KLOR-CON M10 10 MEQ tablet Take 10 mEq by mouth daily.     losartan-hydrochlorothiazide (HYZAAR) 50-12.5 MG tablet Take 1 tablet by mouth every morning. 90 tablet 1   modafinil (PROVIGIL) 100 MG tablet TAKE 1.5 TABLETS BY MOUTH DAILY (Patient taking differently: Take 150 mg by mouth daily as needed (to prevent sleep while driving).) 45 tablet 3   Multiple Vitamin (MULTIVITAMIN WITH MINERALS) TABS tablet Take 1 tablet  by mouth daily. Without iron     Multiple Vitamins-Minerals (PRESERVISION AREDS 2 PO) Take 1 tablet by mouth in the morning and at bedtime.     pravastatin (PRAVACHOL) 40 MG tablet Take 40 mg by mouth at bedtime.  2   Probiotic Product (ALIGN PO) Take 1 capsule by mouth daily.     Travoprost, BAK Free, (TRAVATAN) 0.004 % SOLN ophthalmic solution Place 1 drop into both eyes at bedtime.     triamcinolone (NASACORT) 55 MCG/ACT AERO nasal inhaler Place 2 sprays into the nose at bedtime.     vitamin B-12 (CYANOCOBALAMIN) 1000 MCG tablet Take 1,000 mcg by mouth daily.     No current facility-administered medications for this visit.    PAST MEDICAL HISTORY: Past Medical History:  Diagnosis Date   Arthritis    Asthma  Benign essential tremor    BPH (benign prostatic hyperplasia)    Central serous retinopathy    COAG (chronic open-angle glaucoma)    Colon polyps    Diverticulitis    Essential hypertension 06/22/2018   Glaucoma    Heart block AV complete (HCC)    Hypercholesterolemia 06/22/2018   Hyperlipidemia    Hypertension    Macular degeneration    Macular hole    NSVT (nonsustained ventricular tachycardia) 06/22/2018   Event Monitor 30 days 05/10/2018: Performed for syncope Predominant rhythm is sinus rhythm with first-degree AV block. No symptoms reported. One 4 beat VT at 8:46PM, Occasional PVCs.   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    PAST SURGICAL HISTORY: Past Surgical History:  Procedure Laterality Date   BACK SURGERY     lumbar fusion   broken ankle  1961   CARDIOVERSION N/A 06/19/2020   Procedure: CARDIOVERSION;  Surgeon: Yates Decamp, MD;  Location: [facility] ENDOSCOPY;  Service: Cardiovascular;  Laterality: N/A;   CARPAL TUNNEL RELEASE Right 10/15/2017   Procedure: RIGHT CARPAL TUNNEL  RELEASE;  Surgeon: Cindee Salt, MD;  Location: [facility];  Service: Orthopedics;  Laterality: Right;   CATARACT EXTRACTION, BILATERAL     HERNIA REPAIR     PACEMAKER IMPLANT N/A 01/14/2021   Procedure: PACEMAKER IMPLANT;  Surgeon: Lanier Prude, MD;  Location: [facility] INVASIVE CV LAB;  Service: Cardiovascular;  Laterality: N/A;   REPLACEMENT TOTAL KNEE  2005   VITRECTOMY Left 2006    FAMILY HISTORY: Family History  Problem Relation Age of Onset   Hypertension Mother    Stroke Mother    Ulcers Father 24       bleeding/ deceased   Lymphoma Brother        cancer   Breast cancer Sister    Colon cancer Neg Hx    Stomach cancer Neg Hx    Rectal cancer Neg Hx    Esophageal cancer Neg Hx    Liver cancer Neg Hx     SOCIAL HISTORY: Social History   Socioeconomic History   Marital status: Single    Spouse name: Not on file   Number of children: 0   Years of education: Not on file   Highest education level: Not on file  Occupational History   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  Lives at home,  With [facility].  Works at [employer].  Lillette Boxer, post graduate.   Single, no kids.     Social Determinants of Health   Financial Resource Strain: Not on file  Food Insecurity: Not on file  Transportation Needs: Not on file  Physical Activity: Not on file  Stress: Not on file  Social Connections: Not on file  Intimate Partner Violence: Not on file      Levert Feinstein, M.D. Ph.D.  [facility] [phone] [address]rd  [address], [address] [place], [place] [address] Ph: [phone] Fax: [phone]  CC:  Rodrigo Ran, MD [address] [place],  [place] [address]

## 2021-02-06 NOTE — Progress Notes (Signed)
Botox- 100 units  Lot: [id number] Expiration: 07/2022 NDC: 6837-2902-11   SP

## 2021-02-12 ENCOUNTER — Encounter (HOSPITAL_COMMUNITY): Payer: Self-pay | Admitting: Cardiology

## 2021-02-12 ENCOUNTER — Ambulatory Visit (HOSPITAL_COMMUNITY)
Admission: RE | Admit: 2021-02-12 | Discharge: 2021-02-12 | Disposition: A | Discharge status: Home / Self Care | Payer: Medicare Other | Attending: Cardiology | Admitting: Cardiology

## 2021-02-12 ENCOUNTER — Ambulatory Visit (HOSPITAL_COMMUNITY): Payer: Medicare Other | Admitting: Anesthesiology

## 2021-02-12 ENCOUNTER — Encounter (HOSPITAL_COMMUNITY)
Admission: RE | Disposition: A | Discharge status: Home / Self Care | Payer: Self-pay | Source: Home / Self Care | Attending: Cardiology

## 2021-02-12 DIAGNOSIS — Z8249 Family history of ischemic heart disease and other diseases of the circulatory system: Secondary | ICD-10-CM | POA: Insufficient documentation

## 2021-02-12 DIAGNOSIS — I442 Atrioventricular block, complete: Secondary | ICD-10-CM | POA: Insufficient documentation

## 2021-02-12 DIAGNOSIS — I484 Atypical atrial flutter: Secondary | ICD-10-CM | POA: Insufficient documentation

## 2021-02-12 DIAGNOSIS — Z950 Presence of cardiac pacemaker: Secondary | ICD-10-CM | POA: Insufficient documentation

## 2021-02-12 DIAGNOSIS — E785 Hyperlipidemia, unspecified: Secondary | ICD-10-CM | POA: Insufficient documentation

## 2021-02-12 DIAGNOSIS — Z7901 Long term (current) use of anticoagulants: Secondary | ICD-10-CM | POA: Insufficient documentation

## 2021-02-12 DIAGNOSIS — Z79899 Other long term (current) drug therapy: Secondary | ICD-10-CM | POA: Insufficient documentation

## 2021-02-12 DIAGNOSIS — G4733 Obstructive sleep apnea (adult) (pediatric): Secondary | ICD-10-CM | POA: Diagnosis not present

## 2021-02-12 DIAGNOSIS — I10 Essential (primary) hypertension: Secondary | ICD-10-CM | POA: Insufficient documentation

## 2021-02-12 DIAGNOSIS — I483 Typical atrial flutter: Secondary | ICD-10-CM

## 2021-02-12 DIAGNOSIS — Z8679 Personal history of other diseases of the circulatory system: Secondary | ICD-10-CM | POA: Insufficient documentation

## 2021-02-12 DIAGNOSIS — I4819 Other persistent atrial fibrillation: Secondary | ICD-10-CM

## 2021-02-12 HISTORY — PX: CARDIOVERSION: SHX1299

## 2021-02-12 LAB — POCT I-STAT, CHEM 8
BUN: 12 mg/dL (ref 8–23)
Calcium, Ion: 1.2 mmol/L (ref 1.15–1.40)
Chloride: 95 mmol/L — ABNORMAL LOW (ref 98–111)
Creatinine, Ser: 0.8 mg/dL (ref 0.61–1.24)
Glucose, Bld: 100 mg/dL — ABNORMAL HIGH (ref 70–99)
HCT: 40 % (ref 39.0–52.0)
Hemoglobin: 13.6 g/dL (ref 13.0–17.0)
Potassium: 4 mmol/L (ref 3.5–5.1)
Sodium: 134 mmol/L — ABNORMAL LOW (ref 135–145)
TCO2: 27 mmol/L (ref 22–32)

## 2021-02-12 SURGERY — CARDIOVERSION
Anesthesia: General

## 2021-02-12 MED ORDER — PROPOFOL 10 MG/ML IV BOLUS
INTRAVENOUS | Status: DC | PRN
  Administered 2021-02-12: 40 mg via INTRAVENOUS

## 2021-02-12 MED ORDER — SODIUM CHLORIDE 0.9 % IV SOLN: INTRAVENOUS | Status: DC | PRN

## 2021-02-12 NOTE — Interval H&P Note (Signed)
History and Physical Interval Note:  02/12/2021 12:17 PM  Nicholas Robbins  has presented today for surgery, with the diagnosis of AFIB.  The various methods of treatment have been discussed with the patient and family. After consideration of risks, benefits and other options for treatment, the patient has consented to  Procedure(s): CARDIOVERSION (N/A) as a surgical intervention.  The patient's history has been reviewed, patient examined, no change in status, stable for surgery.  I have reviewed the patient's chart and labs.  Questions were answered to the patient's satisfaction.     Elaf Clauson J Aiyla Baucom

## 2021-02-12 NOTE — Interval H&P Note (Signed)
History and Physical Interval Note:  02/12/2021 12:25 PM  Nicholas Robbins  has presented today for surgery, with the diagnosis of AFIB.  The various methods of treatment have been discussed with the patient and family. After consideration of risks, benefits and other options for treatment, the patient has consented to  Procedure(s): CARDIOVERSION (N/A) as a surgical intervention.  The patient's history has been reviewed, patient examined, no change in status, stable for surgery.  I have reviewed the patient's chart and labs.  Questions were answered to the patient's satisfaction.      Meylin Stenzel J Alysia Scism

## 2021-02-12 NOTE — H&P (Signed)
OV 01/23/2021 copied for documentation    Primary Physician/Referring:  Rodrigo Ran, MD  Patient ID: [facility], male    DOB: 1929/01/24, 87 y.o.   MRN: [id number]  Chief Complaint  Patient presents with   Atrial Flutter   Follow-up   HPI:    Nicholas [facility]  is a 87 y.o. male who is very active and continues to live independently and works at [facility]. 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 Event monitor in January 2020 did not reveal any high degree AV block but had 4 beat NSVT, nuclear stress test on 08/20/2018 was nonischemic.   Patient was seen by me on 10/22/2020, he was enrolled in [employer] clinical trial for heart failure and loop recorder, however developed complete heart block and was admitted to the [facility] on 01/13/2021 for complete heart block and underwent dual-chamber pacemaker implantation on 01/14/2021.  Loop recorder was explanted at the same time.  He had a 4-day [facility] stay and was discharged home and he now presents for follow-up.  Past Medical History:  Diagnosis Date   Arthritis    Asthma    Benign essential tremor    BPH (benign prostatic hyperplasia)    Central serous retinopathy    COAG (chronic open-angle glaucoma)    Colon polyps    Diverticulitis    Essential hypertension 06/22/2018   Glaucoma    Heart block AV complete (HCC)    Hypercholesterolemia 06/22/2018   Hyperlipidemia    Hypertension    Macular degeneration    Macular hole    NSVT (nonsustained ventricular tachycardia) (HCC) 06/22/2018   Event Monitor 30 days 05/10/2018: Performed for syncope Predominant rhythm is sinus rhythm with first-degree AV block. No symptoms reported. One 4 beat VT at 8:46PM, Occasional PVCs.   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   Past Surgical History:  Procedure Laterality Date   BACK SURGERY     lumbar fusion   broken ankle  1961   CARDIOVERSION N/A 06/19/2020   Procedure: CARDIOVERSION;  Surgeon: Yates Decamp, MD;  Location: [facility];  Service: Cardiovascular;  Laterality: N/A;   CARPAL TUNNEL RELEASE Right 10/15/2017   Procedure: RIGHT CARPAL TUNNEL RELEASE;  Surgeon: Cindee Salt, MD;  Location: [facility];  Service: Orthopedics;  Laterality: Right;   CATARACT EXTRACTION, BILATERAL     HERNIA REPAIR     PACEMAKER IMPLANT N/A 01/14/2021   Procedure: PACEMAKER IMPLANT;  Surgeon: Lanier Prude, MD;  Location: [facility];  Service: Cardiovascular;  Laterality: N/A;   REPLACEMENT TOTAL KNEE  2005   VITRECTOMY Left 2006   Family History  Problem Relation Age of Onset   Hypertension Mother    Stroke Mother    Ulcers Father 77       bleeding/ deceased   Lymphoma Brother        cancer   Breast cancer Sister    Colon cancer Neg Hx    Stomach cancer Neg Hx    Rectal cancer Neg Hx    Esophageal cancer Neg Hx    Liver cancer Neg Hx  Social History   Tobacco Use   Smoking status: Never   Smokeless tobacco: Never  Substance Use Topics   Alcohol use: Yes    Alcohol/week: 0.0 standard drinks    Comment: occasional   Marital Status: Single  ROS  Review of Systems  Constitutional: Negative for malaise/fatigue.  Cardiovascular:  Positive for dyspnea on exertion. Negative for leg swelling, palpitations and syncope.  Musculoskeletal:  Positive for back pain.  Gastrointestinal:  Negative for melena.  Neurological:  Positive for focal weakness and tremors.  Objective  Blood pressure 125/83, pulse 77, height 5\' 6"  (1.676 m), weight 173 lb 6.4 oz (78.7 kg), SpO2 100 %.  Vitals with BMI 01/23/2021 01/16/2021 01/16/2021  Height 5\' 6"  - -  Weight 173 lbs 6 oz - -   BMI 28 - -  Systolic 125 119 01/18/2021  Diastolic 83 78 69  Pulse 77 71 70     Physical Exam Constitutional:      General: He is not in acute distress.    Appearance: He is well-developed.     Comments: Mildly oebese  Neck:     Vascular: No carotid bruit or JVD.  Cardiovascular:     Rate and Rhythm: Normal rate and regular rhythm. No extrasystoles are present.    Pulses: Intact distal pulses.     Heart sounds: No murmur heard.   No gallop.  Pulmonary:     Effort: Pulmonary effort is normal. No accessory muscle usage.     Breath sounds: Normal breath sounds.  Chest:     Comments: Pacemaker/ICD site noted  in the left infraclavicular fossa.  Abdominal:     General: Bowel sounds are normal.     Palpations: Abdomen is soft.  Musculoskeletal:     Right lower leg: Edema (trace) present.     Left lower leg: No edema.   Laboratory examination:   CMP Latest Ref Rng & Units 01/16/2021 01/13/2021 01/13/2021  Glucose 70 - 99 mg/dL 93 01/15/2021) 01/15/2021)  BUN 8 - 23 mg/dL 20 22 20   Creatinine 0.61 - 1.24 mg/dL 646(O 032(Z  Sodium 135 - 145 mmol/L 135 129(L) 128(L)  Potassium 3.5 - 5.1 mmol/L 3.7 4.3 4.3  Chloride 98 - 111 mmol/L 101 95(L) 96(L)  CO2 22 - 32 mmol/L 24 - 21(L)  Calcium 8.9 - 10.3 mg/dL 2.24) - 8.6(L)  Total Protein 6.5 - 8.1 g/dL - - 6.5  Total Bilirubin 0.3 - 1.2 mg/dL - - 1.0  Alkaline Phos 38 - 126 U/L - - 69  AST 15 - 41 U/L - - 112(H)  ALT 0 - 44 U/L - - 72(H)   CBC Latest Ref Rng & Units 01/13/2021 01/13/2021 09/19/2020  WBC 4.0 - 10.5 K/uL - 10.0 9.4  Hemoglobin 13.0 - 17.0 g/dL 01/15/2021 11.9(L) 11.7(L)  Hematocrit 39.0 - 52.0 % 39.0 36.9(L) 35.5(L)  Platelets 150 - 400 K/uL - 176 171   Lipid Panel  No results found for: CHOL, TRIG, HDL, CHOLHDL, VLDL, LDLCALC, LDLDIRECT HEMOGLOBIN A1C Lab Results  Component Value Date   HGBA1C 5.6 01/14/2021   MPG 114.02 01/14/2021   TSH Recent Labs    01/14/21 0418  TSH 2.762     External labs:   Lab 05/22/2020:  Hb  11.6/HCT 36.7, platelets not reported.  Normal indicis. Cholesterol, total 159.000 m 11/03/2019 HDL 53 MG/DL 01/16/21 LDL 05/24/2020 mg 11/03/2019 Triglycerides 106.000 11/03/2019  Hemoglobin 12.400 g/d 11/03/2019  Creatinine, Serum 1.100 mg/ 11/03/2019 ALT (SGPT) 12.000 uni 11/03/2019  TSH 2.370 11/03/2019  Na/K 139/4.7  07/14/2019  Cholesterol, total 178.000 07/14/2019 Triglycerides 251.000 07/14/2019 HDL 39.000 07/14/2019 LDL-C 96.000 07/14/2019  BUN 22.000 07/14/2019 Creatinine, Serum 1.210 07/14/2019  A1C 5.700 07/14/2019  Glucose Random 117.000 07/14/2019  MicroAlbumin Urine 23.950 09/29/2018 MicroAlbumin/Creat 208.300 09/29/2018  PSA 7.850 06/17/2016  Medications and allergies  No Known Allergies   Current Outpatient Medications on File Prior to Visit  Medication Sig Dispense Refill   acetaminophen (TYLENOL) 500 MG tablet Take 1,000 mg by mouth daily as needed (pain).     alfuzosin (UROXATRAL) 10 MG 24 hr tablet Take 10 mg by mouth daily.     apixaban (ELIQUIS) 5 MG TABS tablet Take 1 tablet (5 mg total) by mouth 2 (two) times daily. 60 tablet    Bioflavonoid Products (VITAMIN C) CHEW Chew 1 tablet by mouth daily. Gummy     BOTOX 100 units SOLR injection Inject 100 Units into the muscle every 3 (three) months.     carbidopa-levodopa (SINEMET IR) 25-100 MG tablet TAKE 1 TABLET BY MOUTH THREE TIMES A DAY (Patient taking differently: Take 1 tablet by mouth 3 (three) times daily.) 270 tablet 1   COD LIVER OIL PO Take 5 mLs by mouth daily.     ELDERBERRY PO Take 100 mg by mouth daily.     fexofenadine (ALLEGRA) 180 MG tablet Take 180 mg by mouth every evening.     finasteride (PROSCAR) 5 MG tablet Take 5 mg by mouth daily.  3   folic acid (FOLVITE) 800 MCG tablet Take 800 mcg by mouth every evening.     Glucosamine-Chondroitin (COSAMIN DS PO) Take 1 tablet by mouth in the morning and at bedtime.     hyoscyamine (LEVSIN SL) 0.125 MG SL tablet Place 0.125 mg under the tongue as needed for cramping.   (Patient not taking: Reported on 01/14/2021)     KLOR-CON M10 10 MEQ tablet Take 10 mEq by mouth daily.     losartan-hydrochlorothiazide (HYZAAR) 50-12.5 MG tablet Take 1 tablet by mouth every morning. 90 tablet 1   modafinil (PROVIGIL) 100 MG tablet TAKE 1.5 TABLETS BY MOUTH DAILY (Patient taking differently: Take 150 mg by mouth daily as needed (to prevent sleep while driving).) 45 tablet 3   Multiple Vitamin (MULTIVITAMIN WITH MINERALS) TABS tablet Take 1 tablet by mouth daily. Without iron     Multiple Vitamins-Minerals (PRESERVISION AREDS 2 PO) Take 1 tablet by mouth in the morning and at bedtime.     pravastatin (PRAVACHOL) 40 MG tablet Take 40 mg by mouth at bedtime.  2   Probiotic Product (ALIGN PO) Take 1 capsule by mouth daily.     Travoprost, BAK Free, (TRAVATAN) 0.004 % SOLN ophthalmic solution Place 1 drop into both eyes at bedtime.     triamcinolone (NASACORT) 55 MCG/ACT AERO nasal inhaler Place 2 sprays into the nose at bedtime.     vitamin B-12 (CYANOCOBALAMIN) 1000 MCG tablet Take 1,000 mcg by mouth daily.     No current facility-administered medications on file prior to visit.    Radiology:   Chest x-ray PA and lateral view 05/28/2020:  Heart size is normal, persistent bilateral pleural effusion at the lung bases compared to 05/22/2020 no change. Mild thoracic spondylosis.  Hyperinflation. Impression: Pneumonia versus minimal congestive heart failure, hyperinflation consistent with COPD.  Cardiac Studies:   Sleep study 03/31/2014 Positive for Sleep Apnea, follows Dr. Casilda Carls myoview 06/16/2014: SPECT images demonstrate a moderate-sized mild ischemia extending from the base towards  the apex in the septal wall. Left ventricle systolic function was Calculated at 50%. There was mild septal hypokinesis. This represents an intermediate risk scan.  Event Monitor 30 days 05/10/2018:  Predominant rhythm is sinus rhythm with first-degree AV block. No symptoms reported. One  4 beat VT at 8:46PM, Occasional PVCs.  Lexiscan myoview stress test 06/21/2018:  1. Lexiscan stress test was performed. Exercise capacity was not assessed. No stress symptoms reported. Resting blood pressure was 136/72 mmHg and peak effect blood pressure was 140/70 mmHg. The resting and stress electrocardiogram demonstrated normal sinus rhythm, RBBB + LAHB, occasional PVC and normal rest repolarization.  Stress EKG is non diagnostic for ischemia as it is a pharmacologic stress.  2. The overall quality of the study is good. There is no evidence of abnormal lung activity. Stress and rest SPECT images demonstrate homogeneous tracer distribution throughout the myocardium. Gated SPECT imaging reveals mild global decrease in myocardial thickening and wall motion. The left ventricular ejection fraction was normal (46%).   3. High risk study due to reduced LVEF. No evidence of ischemia/ infarction.   Echocardiogram 06/07/2020:  Left ventricle cavity is normal in size and wall thickness. Abnormal  septal wall motion due to left bundle branch block. Normal LV systolic  function with EF 53%. Unable to evaluate diastolic function due to atrial  fibrillation. Calculated EF 53%.  Left atrial cavity is mildly dilated.  Right atrial cavity is moderately dilated.  Mild to moderate mitral regurgitation.  Moderate tricuspid regurgitation. Estimated pulmonary artery systolic  pressure 38 mmHg.  Insignificant pericardial effusion.  Compared to previous study in 2020, mild PH, pericardial effusion are new.  Tricuspid regurgitation has increased from trace to moderate.    Pacemaker Implantation: Device Clinic: [employer] Dual chamber PACEMAKER  01/14/2021    EKG  EKG 01/13/2021: Atrial fibrillation with controlled ventricular response, ventricularly paced rhythm at rate of 71 bpm.  No further analysis.  EKG 09/13/2020: Atypical atrial flutter with variable AV conduction at rate of 76 bpm.  Left axis  deviation, left anterior fascicular block.  Right bundle branch block. Poor R progression, CRO anterolateral infarct old.  PVCs (2).  No significant change from 07/13/2020.   EKG 06/15/2020: Atrial fibrillation with controlled ventricular response at the rate of 94 bpm, left axis deviation, left anterior fascicular block.  Right bundle branch block.  Poor R wave progression, cannot exclude anterolateral infarct old.  Normal QT interval.  Compared to 05/31/2019, atypical atrial flutter.  Otherwise no significant change.   EKG 04/03/2020: Sinus rhythm with first-degree block at rate of 77 bpm, left axis deviation, left intrafascicular block.  Right bundle branch block.  Trifascicular block.  Poor R wave progression, cannot exclude anterolateral infarct old.  Unspecific T abnormality.   No significant change from EKG 09/19/2019,  03/17/2019.   Assessment     ICD-10-CM   1. Atypical atrial flutter (HCC)  I48.4 EKG 12-Lead    2. Persistent atrial fibrillation (HCC)  I48.19     3. Heart block AV complete (HCC)  I44.2     4. Pacemaker [employer] Dual chamber PACEMAKER ACCOLADE DR-EL 01/14/2021  Z95.0       CHA2DS2-VASc Score is 4.  Yearly risk of stroke: 4.8% (A, HTN, Coronary atherosclerosis).  Score of 1=0.6; 2=2.2; 3=3.2; 4=4.8; 5=7.2; 6=9.8; 7=>9.8) -(CHF; HTN; vasc disease DM,  Male = 1; Age <65 =0; 65-74 = 1,  >75 =2; stroke/embolism= 2).    No orders of the defined types were placed  in this encounter.   There are no discontinued medications.   Recommendations:   [facility] is a fairly active 87 y.o. male who is very active and continues to live independently and works at [facility]. [facility] in 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 Event monitor in January 2020 did not reveal any high degree AV block but had 4 beat NSVT,  nuclear stress test on 08/20/2018 was nonischemic.   IPatient was seen by me on 10/22/2020, he was enrolled in [employer] clinical trial for heart failure and loop recorder, however developed complete heart block and was admitted to the [facility] on 01/13/2021 for complete heart block and underwent dual-chamber pacemaker implantation on 01/14/2021.  Loop recorder was explanted at the same time.  He had a 4-day [facility] stay and was discharged home and he now presents for follow-up.  He is presently doing well, pacemaker site has healed well without any hematoma or complications or discharge.  He is now back on Eliquis started 3 days ago.  He has not had any fall, no dizziness.  Blood pressure is well controlled.  EKG reveals persistent atrial fibrillation.  Now that he has a pacemaker, previously he had trifascicular block, we were inhibited in using medications for rhythm control.  I will consider starting him on amiodarone, will discuss with Dr. Lalla Brothers and if he agrees, we will also schedule him for direct-current cardioversion in 3 weeks from today and I will see him back in 2 months.  All questions answered, all the medications from the hospitalization reviewed.   Yates Decamp, MD, [facility] 01/23/2021, 9:49 AM Office: [phone] Pager: [phone]

## 2021-02-12 NOTE — Transfer of Care (Signed)
Immediate Anesthesia Transfer of Care Note  Patient: [facility]  Procedure(s) Performed: CARDIOVERSION  Patient Location: Endoscopy Unit  Anesthesia Type:General  Level of Consciousness: drowsy, patient cooperative and responds to stimulation  Airway & Oxygen Therapy: Patient Spontanous Breathing  Post-op Assessment: Report given to RN, Post -op Vital signs reviewed and stable and Patient moving all extremities X 4  Post vital signs: Reviewed and stable  Last Vitals:  Vitals Value Taken Time  BP 131/70   Temp    Pulse 70   Resp 14   SpO2 100     Last Pain:  Vitals:   02/12/21 1130  TempSrc: Temporal  PainSc: 3          Complications: No notable events documented.

## 2021-02-12 NOTE — Anesthesia Procedure Notes (Addendum)
Procedure Name: General with mask airway Date/Time: 02/12/2021 12:26 PM Performed by: Aundria Rud, CRNA Pre-anesthesia Checklist: Patient identified, Emergency Drugs available, Suction available and Patient being monitored Patient Re-evaluated:Patient Re-evaluated prior to induction Oxygen Delivery Method: Simple face mask Preoxygenation: Pre-oxygenation with 100% oxygen Induction Type: IV induction Dental Injury: Teeth and Oropharynx as per pre-operative assessment

## 2021-02-12 NOTE — CV Procedure (Signed)
Direct current cardioversion:  Indication symptomatic: Symptomatic atrial flutter  Procedure: Under deep sedation administered and monitored by anesthesiology, synchronized direct current cardioversion performed. Patient was delivered with 50 Joules of electricity X 1 with success to NSR. Patient tolerated the procedure well. No immediate complication noted.   Asuncion Tapscott J Loany Neuroth, MD [facility] Pager: [phone] Office: [phone] If no answer Cell [phone]     

## 2021-02-13 ENCOUNTER — Encounter (HOSPITAL_COMMUNITY): Payer: Self-pay | Admitting: Cardiology

## 2021-02-14 NOTE — Anesthesia Postprocedure Evaluation (Signed)
Anesthesia Post Note  Patient: [facility]  Procedure(s) Performed: CARDIOVERSION     Patient location during evaluation: PACU Anesthesia Type: General Level of consciousness: awake and alert Pain management: pain level controlled Vital Signs Assessment: post-procedure vital signs reviewed and stable Respiratory status: spontaneous breathing, nonlabored ventilation, respiratory function stable and patient connected to nasal cannula oxygen Cardiovascular status: blood pressure returned to baseline and stable Postop Assessment: no apparent nausea or vomiting Anesthetic complications: no   No notable events documented.  Last Vitals:  Vitals:   02/12/21 1259 02/12/21 1310  BP: 119/74 138/75  Pulse: 75 73  Resp: 17 (!) 21  Temp:    SpO2: 99% 100%    Last Pain:  Vitals:   02/12/21 1310  TempSrc:   PainSc: 0-No pain                 Chelise Hanger

## 2021-02-14 NOTE — Anesthesia Preprocedure Evaluation (Signed)
Anesthesia Evaluation  Patient identified by MRN, date of birth, ID band Patient awake    Reviewed: Allergy & Precautions, NPO status , Patient's Chart, lab work & pertinent test results  History of Anesthesia Complications Negative for: history of anesthetic complications  Airway Mallampati: II  TM Distance: >3 FB Neck ROM: Full    Dental  (+) Teeth Intact, Dental Advisory Given   Pulmonary asthma , sleep apnea and Continuous Positive Airway Pressure Ventilation ,    breath sounds clear to auscultation       Cardiovascular hypertension, Pt. on medications + CAD  + dysrhythmias Atrial Fibrillation + pacemaker  Rhythm:Irregular Rate:Abnormal     Neuro/Psych negative neurological ROS  negative psych ROS   GI/Hepatic negative GI ROS, Neg liver ROS,   Endo/Other  negative endocrine ROS  Renal/GU negative Renal ROS     Musculoskeletal  (+) Arthritis ,   Abdominal   Peds  Hematology negative hematology ROS (+)   Anesthesia Other Findings - HLD  Reproductive/Obstetrics                             Anesthesia Physical Anesthesia Plan  ASA: 3  Anesthesia Plan: General   Post-op Pain Management:    Induction: Intravenous  PONV Risk Score and Plan: 2 and Treatment may vary due to age or medical condition  Airway Management Planned: Mask  Additional Equipment: None  Intra-op Plan:   Post-operative Plan:   Informed Consent: I have reviewed the patients History and Physical, chart, labs and discussed the procedure including the risks, benefits and alternatives for the proposed anesthesia with the patient or authorized representative who has indicated his/her understanding and acceptance.     Dental advisory given  Plan Discussed with: CRNA and Anesthesiologist  Anesthesia Plan Comments:         Anesthesia Quick Evaluation

## 2021-02-15 ENCOUNTER — Other Ambulatory Visit: Payer: Self-pay | Admitting: Cardiology

## 2021-02-15 DIAGNOSIS — I5032 Chronic diastolic (congestive) heart failure: Secondary | ICD-10-CM

## 2021-02-20 ENCOUNTER — Ambulatory Visit: Payer: Medicare Other | Admitting: Student

## 2021-02-20 ENCOUNTER — Other Ambulatory Visit: Payer: Self-pay

## 2021-02-20 ENCOUNTER — Encounter: Payer: Self-pay | Admitting: Student

## 2021-02-20 VITALS — BP 135/73 | HR 87 | Temp 97.8°F | Ht 66.0 in | Wt 170.8 lb

## 2021-02-20 DIAGNOSIS — I4819 Other persistent atrial fibrillation: Secondary | ICD-10-CM

## 2021-02-20 DIAGNOSIS — Z950 Presence of cardiac pacemaker: Secondary | ICD-10-CM

## 2021-02-20 NOTE — Progress Notes (Signed)
Primary Physician/Referring:  Rodrigo Ran, MD  Patient ID: Nicholas Robbins, male    DOB: 1928/05/05, 87 y.o.   MRN: [id number]  Chief Complaint  Patient presents with   Atrial Fibrillation   Hypertension   Follow-up   HPI:    Nicholas Robbins  is a 87 y.o. male who is very active and continues to live independently and works at [employer]. 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 Event monitor in January 2020 did not reveal any high degree AV block but had 4 beat NSVT, nuclear stress test on 08/20/2018 was nonischemic.   Patient developed complete heart block 01/13/2021 and underwent dual-chamber pacemaker implantation 01/14/2021.  Notably he had previously been enrolled in Lux DX trends clinical trial, however loop recorder was explanted at the time of pacemaker implantation.  Patient then followed up in our office 01/23/2021 at which time he was doing atrial fibrillation.  Consulted with Dr. Lalla Brothers who agreed patient may undergo direct-current cardioversion.  Patient underwent successful direct-current cardioversion 02/12/2021.  He now presents for follow-up. Patient is feeling well overall with improvement of energy level since cardioversion.   Past Medical History:  Diagnosis Date   Arthritis    Asthma    Benign essential tremor    BPH (benign prostatic hyperplasia)    Central serous retinopathy    COAG (chronic open-angle glaucoma)    Colon polyps    Diverticulitis    Essential hypertension 06/22/2018   Glaucoma    Heart block AV complete (HCC)    Hypercholesterolemia 06/22/2018   Hyperlipidemia    Hypertension    Macular degeneration    Macular hole    NSVT (nonsustained ventricular tachycardia) 06/22/2018   Event Monitor 30 days 05/10/2018: Performed for syncope Predominant rhythm is sinus rhythm with first-degree AV  block. No symptoms reported. One 4 beat VT at 8:46PM, Occasional PVCs.   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   Past Surgical History:  Procedure Laterality Date   BACK SURGERY     lumbar fusion   broken ankle  1961   CARDIOVERSION N/A 06/19/2020   Procedure: CARDIOVERSION;  Surgeon: Yates Decamp, MD;  Location: [facility] ENDOSCOPY;  Service: Cardiovascular;  Laterality: N/A;   CARDIOVERSION N/A 02/12/2021   Procedure: CARDIOVERSION;  Surgeon: Elder Negus, MD;  Location: [facility] ENDOSCOPY;  Service: Cardiovascular;  Laterality: N/A;   CARPAL TUNNEL RELEASE Right 10/15/2017   Procedure: RIGHT CARPAL TUNNEL RELEASE;  Surgeon: Cindee Salt, MD;  Location: [facility];  Service: Orthopedics;  Laterality: Right;   CATARACT EXTRACTION, BILATERAL     HERNIA REPAIR     PACEMAKER IMPLANT N/A 01/14/2021   Procedure: PACEMAKER IMPLANT;  Surgeon: Lanier Prude, MD;  Location: [facility] INVASIVE CV LAB;  Service: Cardiovascular;  Laterality: N/A;   REPLACEMENT TOTAL KNEE  2005   VITRECTOMY Left 2006   Family History  Problem Relation Age of Onset   Hypertension Mother    Stroke Mother    Ulcers Father 54       bleeding/ deceased   Lymphoma Brother        cancer  Breast cancer Sister    Colon cancer Neg Hx    Stomach cancer Neg Hx    Rectal cancer Neg Hx    Esophageal cancer Neg Hx    Liver cancer Neg Hx     Social History   Tobacco Use   Smoking status: Never   Smokeless tobacco: Never  Substance Use Topics   Alcohol use: Yes    Alcohol/week: 0.0 standard drinks    Comment: occasional   Marital Status: Single  ROS  Review of Systems  Constitutional: Negative for malaise/fatigue and weight gain.  Cardiovascular:  Positive for dyspnea on exertion (improved). Negative for chest pain,  claudication, leg swelling, near-syncope, orthopnea, palpitations, paroxysmal nocturnal dyspnea and syncope.  Respiratory:  Negative for shortness of breath.   Musculoskeletal:  Positive for back pain.  Gastrointestinal:  Negative for melena.  Neurological:  Positive for focal weakness and tremors. Negative for dizziness.  Objective  Blood pressure 135/73, pulse 87, temperature 97.8 F (36.6 C), height 5\' 6"  (1.676 m), weight 170 lb 12.8 oz (77.5 kg), SpO2 99 %.  Vitals with BMI 02/20/2021 02/12/2021 02/12/2021  Height 5\' 6"  - -  Weight 170 lbs 13 oz - -  BMI 27.58 - -  Systolic 135 138 02/14/2021  Diastolic 73 75 74  Pulse 87 73 75     Physical Exam Vitals reviewed.  Constitutional:      General: He is not in acute distress.    Appearance: He is well-developed.     Comments: Mildly oebese  Neck:     Vascular: No carotid bruit or JVD.  Cardiovascular:     Rate and Rhythm: Normal rate and regular rhythm. No extrasystoles are present.    Pulses: Intact distal pulses.     Heart sounds: No murmur heard.   No gallop.  Pulmonary:     Effort: Pulmonary effort is normal. No accessory muscle usage.     Breath sounds: Normal breath sounds.  Chest:     Comments: Pacemaker/ICD site noted  in the left infraclavicular fossa.  Musculoskeletal:     Right lower leg: Edema (trace) present.     Left lower leg: No edema.   Laboratory examination:   CMP Latest Ref Rng & Units 02/12/2021 01/16/2021 01/13/2021  Glucose 70 - 99 mg/dL 01/18/2021) 93 01/15/2021)  BUN 8 - 23 mg/dL 12 20 22   Creatinine 0.61 - 1.24 mg/dL 903(E 092(Z  Sodium 135 - 145 mmol/L 134(L) 135 129(L)  Potassium 3.5 - 5.1 mmol/L 4.0 3.7 4.3  Chloride 98 - 111 mmol/L 95(L) 101 95(L)  CO2 22 - 32 mmol/L - 24 -  Calcium 8.9 - 10.3 mg/dL - 8.6(L) -  Total Protein 6.5 - 8.1 g/dL - - -  Total Bilirubin 0.3 - 1.2 mg/dL - - -  Alkaline Phos 38 - 126 U/L - - -  AST 15 - 41 U/L - - -  ALT 0 - 44 U/L - - -   CBC Latest Ref Rng & Units  02/12/2021 01/13/2021 01/13/2021  WBC 4.0 - 10.5 K/uL - - 10.0  Hemoglobin 13.0 - 17.0 g/dL 02/14/2021 01/15/2021 11.9(L)  Hematocrit 39.0 - 52.0 % 40.0 39.0 36.9(L)  Platelets 150 - 400 K/uL - - 176   Lipid Panel  No results found for: CHOL, TRIG, HDL, CHOLHDL, VLDL, LDLCALC, LDLDIRECT HEMOGLOBIN A1C Lab Results  Component Value Date   HGBA1C 5.6 01/14/2021   MPG 114.02 01/14/2021   TSH Recent Labs    01/14/21 0418  TSH 2.762     External labs:   Lab 05/22/2020:  Hb 11.6/HCT 36.7, platelets not reported.  Normal indicis. Cholesterol, total 159.000 m 11/03/2019 HDL 53 MG/DL 06/02/8525 LDL 78.242 mg 11/03/2019 Triglycerides 106.000 11/03/2019  Hemoglobin 12.400 g/d 11/03/2019  Creatinine, Serum 1.100 mg/ 11/03/2019 ALT (SGPT) 12.000 uni 11/03/2019 TSH 2.370 11/03/2019  Na/K 139/4.7  07/14/2019  Cholesterol, total 178.000 07/14/2019 Triglycerides 251.000 07/14/2019 HDL 39.000 07/14/2019 LDL-C 96.000 07/14/2019  BUN 22.000 07/14/2019 Creatinine, Serum 1.210 07/14/2019  A1C 5.700 07/14/2019  Glucose Random 117.000 07/14/2019  MicroAlbumin Urine 23.950 09/29/2018 MicroAlbumin/Creat 208.300 09/29/2018  PSA 7.850 06/17/2016 Allergies  No Known Allergies    Medications Prior to Visit:   Outpatient Medications Prior to Visit  Medication Sig Dispense Refill   acetaminophen (TYLENOL) 500 MG tablet Take 1,000 mg by mouth every 6 (six) hours as needed for moderate pain.     alfuzosin (UROXATRAL) 10 MG 24 hr tablet Take 10 mg by mouth daily.     apixaban (ELIQUIS) 5 MG TABS tablet Take 1 tablet (5 mg total) by mouth 2 (two) times daily. 60 tablet    BOTOX 100 units SOLR injection Inject 100 Units into the muscle every 3 (three) months.     carbidopa-levodopa (SINEMET IR) 25-100 MG tablet TAKE 1 TABLET BY MOUTH THREE TIMES A DAY 270 tablet 1   COD LIVER OIL PO Take 5 mLs by mouth daily.     ELDERBERRY PO Take 100 mg by mouth daily.     fexofenadine (ALLEGRA) 180 MG tablet Take 180 mg by mouth every  evening.     finasteride (PROSCAR) 5 MG tablet Take 5 mg by mouth daily.  3   folic acid (FOLVITE) 800 MCG tablet Take 800 mcg by mouth every evening.     Glucosamine-Chondroitin (COSAMIN DS PO) Take 1 tablet by mouth in the morning and at bedtime.     hyoscyamine (LEVSIN SL) 0.125 MG SL tablet Place 0.125 mg under the tongue as needed for cramping.     losartan-hydrochlorothiazide (HYZAAR) 50-12.5 MG tablet TAKE 1 TABLET BY MOUTH EVERY DAY IN THE MORNING 90 tablet 1   modafinil (PROVIGIL) 100 MG tablet TAKE 1.5 TABLETS BY MOUTH DAILY (Patient taking differently: Take 150 mg by mouth daily as needed (to prevent sleep while driving).) 45 tablet 3   Multiple Vitamin (MULTIVITAMIN WITH MINERALS) TABS tablet Take 1 tablet by mouth daily. Without iron     Multiple Vitamins-Minerals (PRESERVISION AREDS 2 PO) Take 1 tablet by mouth in the morning and at bedtime.     pravastatin (PRAVACHOL) 40 MG tablet Take 40 mg by mouth at bedtime.  2   Probiotic Product (ALIGN PO) Take 1 capsule by mouth daily.     silodosin (RAPAFLO) 8 MG CAPS capsule Take 8 mg by mouth daily with breakfast.     Travoprost, BAK Free, (TRAVATAN) 0.004 % SOLN ophthalmic solution Place 1 drop into both eyes at bedtime.     triamcinolone (NASACORT) 55 MCG/ACT AERO nasal inhaler Place 2 sprays into the nose at bedtime.     triamcinolone lotion (KENALOG) 0.1 % SMARTSIG:Topical Every Evening     vitamin B-12 (CYANOCOBALAMIN) 1000 MCG tablet Take 1,000 mcg by mouth daily.     vitamin C (ASCORBIC ACID) 250 MG tablet Take 250 mg by mouth daily.     zinc gluconate 50 MG tablet Take 50 mg by mouth daily.     No facility-administered medications prior to visit.   Final Medications at End  of Visit    Current Meds  Medication Sig   acetaminophen (TYLENOL) 500 MG tablet Take 1,000 mg by mouth every 6 (six) hours as needed for moderate pain.   alfuzosin (UROXATRAL) 10 MG 24 hr tablet Take 10 mg by mouth daily.   apixaban (ELIQUIS) 5 MG TABS  tablet Take 1 tablet (5 mg total) by mouth 2 (two) times daily.   BOTOX 100 units SOLR injection Inject 100 Units into the muscle every 3 (three) months.   carbidopa-levodopa (SINEMET IR) 25-100 MG tablet TAKE 1 TABLET BY MOUTH THREE TIMES A DAY   COD LIVER OIL PO Take 5 mLs by mouth daily.   ELDERBERRY PO Take 100 mg by mouth daily.   fexofenadine (ALLEGRA) 180 MG tablet Take 180 mg by mouth every evening.   finasteride (PROSCAR) 5 MG tablet Take 5 mg by mouth daily.   folic acid (FOLVITE) 800 MCG tablet Take 800 mcg by mouth every evening.   Glucosamine-Chondroitin (COSAMIN DS PO) Take 1 tablet by mouth in the morning and at bedtime.   hyoscyamine (LEVSIN SL) 0.125 MG SL tablet Place 0.125 mg under the tongue as needed for cramping.   losartan-hydrochlorothiazide (HYZAAR) 50-12.5 MG tablet TAKE 1 TABLET BY MOUTH EVERY DAY IN THE MORNING   modafinil (PROVIGIL) 100 MG tablet TAKE 1.5 TABLETS BY MOUTH DAILY (Patient taking differently: Take 150 mg by mouth daily as needed (to prevent sleep while driving).)   Multiple Vitamin (MULTIVITAMIN WITH MINERALS) TABS tablet Take 1 tablet by mouth daily. Without iron   Multiple Vitamins-Minerals (PRESERVISION AREDS 2 PO) Take 1 tablet by mouth in the morning and at bedtime.   pravastatin (PRAVACHOL) 40 MG tablet Take 40 mg by mouth at bedtime.   Probiotic Product (ALIGN PO) Take 1 capsule by mouth daily.   silodosin (RAPAFLO) 8 MG CAPS capsule Take 8 mg by mouth daily with breakfast.   Travoprost, BAK Free, (TRAVATAN) 0.004 % SOLN ophthalmic solution Place 1 drop into both eyes at bedtime.   triamcinolone (NASACORT) 55 MCG/ACT AERO nasal inhaler Place 2 sprays into the nose at bedtime.   triamcinolone lotion (KENALOG) 0.1 % SMARTSIG:Topical Every Evening   vitamin B-12 (CYANOCOBALAMIN) 1000 MCG tablet Take 1,000 mcg by mouth daily.   vitamin C (ASCORBIC ACID) 250 MG tablet Take 250 mg by mouth daily.   zinc gluconate 50 MG tablet Take 50 mg by mouth  daily.    Radiology:   Chest x-ray PA and lateral view 05/28/2020:  Heart size is normal, persistent bilateral pleural effusion at the lung bases compared to 05/22/2020 no change. Mild thoracic spondylosis.  Hyperinflation. Impression: Pneumonia versus minimal congestive heart failure, hyperinflation consistent with COPD.  Cardiac Studies:   Sleep study 03/31/2014 Positive for Sleep Apnea, follows Dr. Casilda Carls myoview 06/16/2014: SPECT images demonstrate a moderate-sized mild ischemia extending from the base towards the apex in the septal wall. Left ventricle systolic function was Calculated at 50%. There was mild septal hypokinesis. This represents an intermediate risk scan.  Event Monitor 30 days 05/10/2018:  Predominant rhythm is sinus rhythm with first-degree AV block. No symptoms reported. One 4 beat VT at 8:46PM, Occasional PVCs.  Lexiscan myoview stress test 06/21/2018:  1. Lexiscan stress test was performed. Exercise capacity was not assessed. No stress symptoms reported. Resting blood pressure was 136/72 mmHg and peak effect blood pressure was 140/70 mmHg. The resting and stress electrocardiogram demonstrated normal sinus rhythm, RBBB + LAHB, occasional PVC and normal rest repolarization.  Stress  EKG is non diagnostic for ischemia as it is a pharmacologic stress.  2. The overall quality of the study is good. There is no evidence of abnormal lung activity. Stress and rest SPECT images demonstrate homogeneous tracer distribution throughout the myocardium. Gated SPECT imaging reveals mild global decrease in myocardial thickening and wall motion. The left ventricular ejection fraction was normal (46%).   3. High risk study due to reduced LVEF. No evidence of ischemia/ infarction.   Echocardiogram 06/07/2020:  Left ventricle cavity is normal in size and wall thickness. Abnormal  septal wall motion due to left bundle branch block. Normal LV systolic  function with EF 53%. Unable to  evaluate diastolic function due to atrial  fibrillation. Calculated EF 53%.  Left atrial cavity is mildly dilated.  Right atrial cavity is moderately dilated.  Mild to moderate mitral regurgitation.  Moderate tricuspid regurgitation. Estimated pulmonary artery systolic  pressure 38 mmHg.  Insignificant pericardial effusion.  Compared to previous study in 2020, mild PH, pericardial effusion are new.  Tricuspid regurgitation has increased from trace to moderate.   Direct current cardioversion 02/12/2021: Indication symptomatic: Symptomatic atrial flutter Procedure: Under deep sedation administered and monitored by anesthesiology, synchronized direct current cardioversion performed. Patient was delivered with 50 Joules of electricity X 1 with success to NSR. Patient tolerated the procedure well. No immediate complication noted.    Pacemaker Implantation: Device Clinic: [employer] Dual chamber PACEMAKER  01/14/2021    EKG  02/20/2021: underlying sinus rhythm, ventricularly paced rhythm at a rate of 83 bpm.   EKG 01/13/2021: Atrial fibrillation with controlled ventricular response, ventricularly paced rhythm at rate of 71 bpm.  No further analysis.  EKG 09/13/2020: Atypical atrial flutter with variable AV conduction at rate of 76 bpm.  Left axis deviation, left anterior fascicular block.  Right bundle branch block. Poor R progression, CRO anterolateral infarct old.  PVCs (2).  No significant change from 07/13/2020.   EKG 06/15/2020: Atrial fibrillation with controlled ventricular response at the rate of 94 bpm, left axis deviation, left anterior fascicular block.  Right bundle branch block.  Poor R wave progression, cannot exclude anterolateral infarct old.  Normal QT interval.  Compared to 05/31/2019, atypical atrial flutter.  Otherwise no significant change.   EKG 04/03/2020: Sinus rhythm with first-degree block at rate of 77 bpm, left axis deviation, left intrafascicular block.  Right  bundle branch block.  Trifascicular block.  Poor R wave progression, cannot exclude anterolateral infarct old.  Unspecific T abnormality.   No significant change from EKG 09/19/2019,  03/17/2019.   Assessment     ICD-10-CM   1. Persistent atrial fibrillation (HCC)  I48.19 EKG 12-Lead    2. Pacemaker [employer] Dual chamber PACEMAKER ACCOLADE DR-EL 01/14/2021  Z95.0       CHA2DS2-VASc Score is 4.  Yearly risk of stroke: 4.8% (A, HTN, Coronary atherosclerosis).  Score of 1=0.6; 2=2.2; 3=3.2; 4=4.8; 5=7.2; 6=9.8; 7=>9.8) -(CHF; HTN; vasc disease DM,  Male = 1; Age <65 =0; 65-74 = 1,  >75 =2; stroke/embolism= 2).    No orders of the defined types were placed in this encounter.   There are no discontinued medications.   Recommendations:   Mr. Emmanuel Busler is a fairly active 87 y.o. male who is very active and continues to live independently and works at [employer]. 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 Event monitor in January 2020 did not reveal any high degree AV block but had 4 beat NSVT, nuclear stress test on 08/20/2018 was nonischemic.   Patient developed complete heart block 01/13/2021 and underwent dual-chamber pacemaker implantation 01/14/2021.  Notably he had previously been enrolled in Lux DX trends clinical trial, however loop recorder was explanted at the time of pacemaker implantation.  Patient then followed up in our office 01/23/2021 at which time he was doing atrial fibrillation.  Consulted with Dr. Lalla Brothers who agreed patient may undergo direct-current cardioversion.  Patient underwent successful direct-current cardioversion 02/12/2021.  He now presents for follow-up.  Patient is feeling improved overall and EKG reveals underlying sinus rhythm with ventricular pacing at a rate of 83 bpm.  Blood pressure is well  controlled.  Patient is tolerating Eliquis without bleeding diathesis, will continue this.  Patient continues to follow with Dr. Lalla Brothers.  Follow up in 3 months, sooner  if needed.    Rayford Halsted, PA-C 02/20/2021, 2:50 PM Office: [phone]

## 2021-02-24 ENCOUNTER — Encounter: Payer: Self-pay | Admitting: Pharmacist

## 2021-02-24 NOTE — Progress Notes (Signed)
CARE PLAN ENTRY  02/24/2021 Name: Nicholas Robbins MRN: [id number] DOB: Dec 01, 1928  [name] is enrolled in Remote Patient Monitoring/Principle Care Monitoring.  Date of Enrollment: 05/30/20 Supervising physician: Yates Decamp Indication: CHF  Remote Readings: Compliant, 165-170 lbs.   Next scheduled OV: 03/27/21  Pharmacist Clinical Goal(s):  Over the next 90 days, patient will demonstrate Improved medication adherence as evidenced by medication fill history Over the next 90 days, patient will demonstrate improved understanding of prescribed medications and rationale for usage as evidenced by patient teach back Over the next 90 days, patient will experience decrease in ED visits. ED visits in last 6 months = 0 Over the next 90 days, patient will not experience hospital admission. Hospital Admissions in last 6 months = 0  Interventions: Provider and Inter-disciplinary care team collaboration (see longitudinal plan of care) Comprehensive medication review performed. Discussed plans with patient for ongoing care management follow up and provided patient with direct contact information for care management team Collaboration with provider re: medication management  Patient Self Care Activities:  Self administers medications as prescribed Attends all scheduled provider appointments Performs ADL's independently Performs IADL's independently  No Known Allergies Outpatient Encounter Medications as of 02/24/2021  Medication Sig Note   acetaminophen (TYLENOL) 500 MG tablet Take 1,000 mg by mouth every 6 (six) hours as needed for moderate pain.    alfuzosin (UROXATRAL) 10 MG 24 hr tablet Take 10 mg by mouth daily.    apixaban (ELIQUIS) 5 MG TABS tablet Take 1 tablet (5 mg total) by mouth 2 (two) times daily.    BOTOX 100 units SOLR injection Inject 100 Units into the muscle every 3 (three) months. 01/14/2021: Due 02/04/2021   carbidopa-levodopa (SINEMET IR) 25-100 MG tablet TAKE 1 TABLET  BY MOUTH THREE TIMES A DAY    COD LIVER OIL PO Take 5 mLs by mouth daily.    ELDERBERRY PO Take 100 mg by mouth daily.    fexofenadine (ALLEGRA) 180 MG tablet Take 180 mg by mouth every evening.    finasteride (PROSCAR) 5 MG tablet Take 5 mg by mouth daily.    folic acid (FOLVITE) 800 MCG tablet Take 800 mcg by mouth every evening.    Glucosamine-Chondroitin (COSAMIN DS PO) Take 1 tablet by mouth in the morning and at bedtime.    hyoscyamine (LEVSIN SL) 0.125 MG SL tablet Place 0.125 mg under the tongue as needed for cramping.    losartan-hydrochlorothiazide (HYZAAR) 50-12.5 MG tablet TAKE 1 TABLET BY MOUTH EVERY DAY IN THE MORNING    modafinil (PROVIGIL) 100 MG tablet TAKE 1.5 TABLETS BY MOUTH DAILY (Patient taking differently: Take 150 mg by mouth daily as needed (to prevent sleep while driving).)    Multiple Vitamin (MULTIVITAMIN WITH MINERALS) TABS tablet Take 1 tablet by mouth daily. Without iron    Multiple Vitamins-Minerals (PRESERVISION AREDS 2 PO) Take 1 tablet by mouth in the morning and at bedtime.    pravastatin (PRAVACHOL) 40 MG tablet Take 40 mg by mouth at bedtime.    Probiotic Product (ALIGN PO) Take 1 capsule by mouth daily.    silodosin (RAPAFLO) 8 MG CAPS capsule Take 8 mg by mouth daily with breakfast.    Travoprost, BAK Free, (TRAVATAN) 0.004 % SOLN ophthalmic solution Place 1 drop into both eyes at bedtime.    triamcinolone (NASACORT) 55 MCG/ACT AERO nasal inhaler Place 2 sprays into the nose at bedtime.    triamcinolone lotion (KENALOG) 0.1 % SMARTSIG:Topical Every Evening  vitamin B-12 (CYANOCOBALAMIN) 1000 MCG tablet Take 1,000 mcg by mouth daily.    vitamin C (ASCORBIC ACID) 250 MG tablet Take 250 mg by mouth daily.    zinc gluconate 50 MG tablet Take 50 mg by mouth daily.    No facility-administered encounter medications on file as of 02/24/2021.    Heart Failure   Type: Systolic  Last ejection fraction: 53% NYHA Class: II (slight limitation of  activity) AHA HF Stage: B (Heart disease present - no symptoms present)  Patient is currently controlled on the following medications: Losartan-HCTZ,  Patient has failed these meds in past: Olmesartan, HCTZ, carvedilol, lasix,   We discussed diet and exercise extensively and weighing daily; if you gain more than 3 pounds in one day or 5 pounds in one week call your doctor  Plan  Continue current medications and control with diet and exercise  Weight continues to trend down. Eliquis PAP continues to be active till the end of the year. Recent PPM implantation and DCCV. Pt continues to walk regularly and avoiding salt intake. Denies any lower extremity swelling, dyspnea. Continues to have parkinsons symptoms but remains stable.  ______________ Visit Information SDOH (Social Determinants of Health) assessments performed: Yes.  [name] was given information about Principle Care Management/Remote Patient Monitoring services today including:  RPM/PCM service includes personalized support from designated clinical staff supervised by his physician, including individualized plan of care and coordination with other care providers 24/7 contact phone numbers for assistance for urgent and routine care needs. Standard insurance, coinsurance, copays and deductibles apply for principle care management only during months in which we provide at least 30 minutes of these services. Most insurances cover these services at 100%, however patients may be responsible for any copay, coinsurance and/or deductible if applicable. This service may help you avoid the need for more expensive face-to-face services. Only one practitioner may furnish and bill the service in a calendar month. The patient may stop PCM/RPM services at any time (effective at the end of the month) by phone call to the office staff.  Patient agreed to services and verbal consent obtained.   Manuela Schwartz, Pharm.D. [facility] [phone] Ext: 120

## 2021-03-18 ENCOUNTER — Telehealth: Payer: Self-pay

## 2021-03-18 NOTE — Telephone Encounter (Signed)
Yes we will. Please transfer. He will probably need ablation depending upon what Dr. Elberta Fortis will feel like. JG

## 2021-03-18 NOTE — Telephone Encounter (Signed)
"  BSC alert Atrial Arrhythmia Burden of at least 6.0 hours in a 24 hour period. Presenting rhythm flutter with CVR, ongoing since 11/17 @ 02:37 Pt. had DCCV 10/18, Eliquis prescribed Route to triage LR"  Routing to Dr. Jacinto Halim and Dr. Lalla Brothers. Patient has a follow up with Dr. Lalla Brothers 04/24/21. Forwarding as Lorain Childes

## 2021-03-19 NOTE — Telephone Encounter (Signed)
I have released the patient in [facility] and canceled all his upcoming home remote appointments.

## 2021-03-22 ENCOUNTER — Other Ambulatory Visit: Payer: Self-pay | Admitting: Adult Health

## 2021-03-23 ENCOUNTER — Telehealth: Payer: Self-pay | Admitting: Cardiology

## 2021-03-23 NOTE — Telephone Encounter (Signed)
      Pt called with his remote device to eval pacer has been flashing yellow all morning.  Pt feels fine.  I called [employer] and their system is out causing the yellow flashing, they have a team working on it.  I called pt back and reassured him.  Should go out once they are back on line. Nada Boozer, FNP-C At [facility]  Pgr:[phone] or after 5pm and on weekends call [phone] 03/23/2021.

## 2021-03-26 ENCOUNTER — Other Ambulatory Visit: Payer: Self-pay

## 2021-03-26 ENCOUNTER — Ambulatory Visit: Payer: Medicare Other | Admitting: Adult Health

## 2021-03-26 ENCOUNTER — Encounter: Payer: Self-pay | Admitting: Adult Health

## 2021-03-26 VITALS — BP 151/78 | HR 81 | Ht 66.0 in | Wt 175.6 lb

## 2021-03-26 DIAGNOSIS — G4733 Obstructive sleep apnea (adult) (pediatric): Secondary | ICD-10-CM | POA: Diagnosis not present

## 2021-03-26 DIAGNOSIS — Z9989 Dependence on other enabling machines and devices: Secondary | ICD-10-CM | POA: Diagnosis not present

## 2021-03-26 DIAGNOSIS — K117 Disturbances of salivary secretion: Secondary | ICD-10-CM | POA: Diagnosis not present

## 2021-03-26 DIAGNOSIS — G20 Parkinson's disease: Secondary | ICD-10-CM | POA: Diagnosis not present

## 2021-03-26 MED ORDER — CARBIDOPA-LEVODOPA 25-100 MG PO TABS: 1.0000 | ORAL_TABLET | Freq: Three times a day (TID) | ORAL | 3 refills | Status: AC

## 2021-03-26 NOTE — Progress Notes (Signed)
PATIENT: Nicholas Robbins DOB: 02/27/1929  REASON FOR VISIT: follow up HISTORY FROM: patient  HISTORY OF PRESENT ILLNESS: Today 03/26/21:  Nicholas Robbins is a 86 year old male with a history of Parkinson's disease and obstructive sleep apnea on CPAP.  He returns today for follow-up.  OSA: He reports that CPAP works well for him.  He states that he has the nasal mask.  Reports that his mouth fills with saliva during the night.  Does not think he would be able to use a full facemask.  Also states that he feels that he is a mouth breather.  Parkinsons: Patient continues to have tremor in the left upper extremity.  Denies any trouble chewing or swallowing food.  Reports that he did have an episode of decreased heart rate and passed out.  He has since then received a pacemaker and is doing well.  He is in an exercise class twice a week for gait and balance.  Continue Sinemet 3 times a day.  He has tried taking it 4 times a day but he did not notice any additional benefit.  He feels that the Botox injections has helped with sialorrhea.  Although he does note that it has not been a significant benefit.  09/18/20: Nicholas Robbins is an 87 year old male with a history of Parkinson's disease and obstructive sleep apnea on CPAP.  He returns today for follow-up.   At the last visit a mask refitting was ordered for the patient.  He states that they change in size of mass to a small however they have continued to send the large mass.  He states that he has tried reaching out but nothing has changed.He takes Provigil as needed.  He remains on Sinemet 25-100 mg 1 tablet TID.  He feels that the tremor in his left arm has increased.  Denies any changes with his gait.  Reports that his balance is slightly off due to his back issues.  Denies any falls.  Denies any trouble chewing or swallowing food.  He returns today for an evaluation.      His CPAP download indicates that he uses machine nightly for compliance of 100%.   He uses machine greater than 4 hours each night.  On average he uses his machine 6 hours and 49 minutes.  His residual AHI is 14.3 on 5.6-16 centimeters of water.  Leak in the 95th percentile is 35.9 L/min.  Reports that he continues to take Provigil but only takes it on an as-needed basis.  He does feel that it is not strong enough states that he gets still get sleepy especially when driving.  States that he does have the mask on relatively tight.  He has nasal pillows.  States that there are several nights that he wakes up with bubbles in his mouth.  He remains on Sinemet 1 tablet 3 times a day.  Tremor in the left hand.  Denies any significant changes with his gait or balance.  Uses a cane when ambulating.  Able to complete all ADLs independently.  Denies any trouble chewing or swallowing food.  Returns today for an evaluation.  HISTORY 09/13/19: Nicholas Robbins is an 87 year old male with a history of Parkinson disease and obstructive sleep apnea on CPAP.  His download indicates that he uses machine nightly for compliance of 100%.  He uses machine greater than 4 hours each night.  On average he uses his machine 7 hours and 15 minutes.  His residual AHI is 7.7 on  5 to 16 cm of water with EPR of 1.  Leak in the 95th percentile is 35.4 L/min.  He remains on modafinil but only uses this when he is traveling.   He continues to take Sinemet 1 tablet 3 times a day.  Reports that he often misses the second dose.  Reports that he continues to have a tremor in the left hand.  Denies any significant changes with his gait or balance.  He does use a cane.  Reports that he is a little more stiff at times.  Denies any trouble chewing or swallowing food.  REVIEW OF SYSTEMS: Out of a complete 14 system review of symptoms, the patient complains only of the following symptoms, and all other reviewed systems are negative.  See HPI  ALLERGIES: No Known Allergies  HOME MEDICATIONS: Outpatient Medications Prior to Visit   Medication Sig Dispense Refill   acetaminophen (TYLENOL) 500 MG tablet Take 1,000 mg by mouth every 6 (six) hours as needed for moderate pain.     alfuzosin (UROXATRAL) 10 MG 24 hr tablet Take 10 mg by mouth daily.     apixaban (ELIQUIS) 5 MG TABS tablet Take 1 tablet (5 mg total) by mouth 2 (two) times daily. 60 tablet    BOTOX 100 units SOLR injection Inject 100 Units into the muscle every 3 (three) months.     COD LIVER OIL PO Take 5 mLs by mouth daily.     ELDERBERRY PO Take 100 mg by mouth daily.     fexofenadine (ALLEGRA) 180 MG tablet Take 180 mg by mouth every evening.     finasteride (PROSCAR) 5 MG tablet Take 5 mg by mouth daily.  3   folic acid (FOLVITE) 800 MCG tablet Take 800 mcg by mouth every evening.     Glucosamine-Chondroitin (COSAMIN DS PO) Take 1 tablet by mouth in the morning and at bedtime.     hyoscyamine (LEVSIN SL) 0.125 MG SL tablet Place 0.125 mg under the tongue as needed for cramping.     losartan-hydrochlorothiazide (HYZAAR) 50-12.5 MG tablet TAKE 1 TABLET BY MOUTH EVERY DAY IN THE MORNING 90 tablet 1   modafinil (PROVIGIL) 100 MG tablet TAKE 1.5 TABLETS BY MOUTH DAILY (Patient taking differently: Take 150 mg by mouth daily as needed (to prevent sleep while driving).) 45 tablet 3   Multiple Vitamin (MULTIVITAMIN WITH MINERALS) TABS tablet Take 1 tablet by mouth daily. Without iron     Multiple Vitamins-Minerals (PRESERVISION AREDS 2 PO) Take 1 tablet by mouth in the morning and at bedtime.     pravastatin (PRAVACHOL) 40 MG tablet Take 40 mg by mouth at bedtime.  2   Probiotic Product (ALIGN PO) Take 1 capsule by mouth daily.     silodosin (RAPAFLO) 8 MG CAPS capsule Take 8 mg by mouth daily with breakfast.     Travoprost, BAK Free, (TRAVATAN) 0.004 % SOLN ophthalmic solution Place 1 drop into both eyes at bedtime.     triamcinolone (NASACORT) 55 MCG/ACT AERO nasal inhaler Place 2 sprays into the nose at bedtime.     triamcinolone lotion (KENALOG) 0.1 %  SMARTSIG:Topical Every Evening     vitamin B-12 (CYANOCOBALAMIN) 1000 MCG tablet Take 1,000 mcg by mouth daily.     vitamin C (ASCORBIC ACID) 250 MG tablet Take 250 mg by mouth daily.     zinc gluconate 50 MG tablet Take 50 mg by mouth daily.     carbidopa-levodopa (SINEMET IR) 25-100 MG tablet TAKE 1 TABLET  BY MOUTH THREE TIMES A DAY 270 tablet 1   No facility-administered medications prior to visit.    PAST MEDICAL HISTORY: Past Medical History:  Diagnosis Date   Arthritis    Asthma    Benign essential tremor    BPH (benign prostatic hyperplasia)    Central serous retinopathy    COAG (chronic open-angle glaucoma)    Colon polyps    Diverticulitis    Essential hypertension 06/22/2018   Glaucoma    Heart block AV complete ([place])    Hypercholesterolemia 06/22/2018   Hyperlipidemia    Hypertension    Macular degeneration    Macular hole    NSVT (nonsustained ventricular tachycardia) 06/22/2018   Event Monitor 30 days 05/10/2018: Performed for syncope Predominant rhythm is sinus rhythm with first-degree AV block. No symptoms reported. One 4 beat VT at 8:46PM, Occasional PVCs.   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    PAST SURGICAL HISTORY: Past Surgical History:  Procedure Laterality Date   BACK SURGERY     lumbar fusion   broken ankle  1961   CARDIOVERSION N/A 06/19/2020   Procedure: CARDIOVERSION;  Surgeon: Adrian Prows, MD;  Location: [place];  Service: Cardiovascular;  Laterality: N/A;   CARDIOVERSION N/A 02/12/2021   Procedure: CARDIOVERSION;  Surgeon: Nigel Mormon, MD;  Location: [place] [facility];  Service: Cardiovascular;  Laterality: N/A;   CARPAL TUNNEL RELEASE Right 10/15/2017   Procedure: RIGHT CARPAL TUNNEL RELEASE;  Surgeon: Daryll Brod, MD;  Location: [place];  Service: Orthopedics;  Laterality: Right;   CATARACT EXTRACTION, BILATERAL     HERNIA REPAIR     PACEMAKER IMPLANT N/A 01/14/2021   Procedure: PACEMAKER IMPLANT;  Surgeon: Vickie Epley, MD;  Location: [place] [facility];  Service: Cardiovascular;  Laterality: N/A;   REPLACEMENT TOTAL KNEE  2005   VITRECTOMY Left 2006    FAMILY HISTORY: Family History  Problem Relation Age of Onset   Hypertension Mother    Stroke Mother    Ulcers Father 40       bleeding/ deceased   Lymphoma Brother        cancer   Breast cancer Sister    Colon cancer Neg Hx    Stomach cancer Neg Hx    Rectal cancer Neg Hx    Esophageal cancer Neg Hx    Liver cancer Neg Hx     SOCIAL HISTORY: Social History   Socioeconomic History   Marital status: Single    Spouse name: Not on file   Number of children: 0   Years of education: Not on file   Highest education level: Not on file  Occupational History   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  Lives at home,  With [facility].  Works at [employer].  [employer], post graduate.   Single, no kids.  Social Determinants of Health   Financial Resource Strain: Not on file  Food Insecurity: Not on file  Transportation Needs: Not on file  Physical Activity: Not on file  Stress: Not on file  Social Connections: Not on file  Intimate Partner Violence: Not on file      PHYSICAL EXAM  Vitals:   03/26/21 0926  BP: (!) 151/78  Pulse: 81  Weight: 175 lb 9.6 oz (79.7 kg)  Height: 5\' 6"  (1.676 m)   Body mass index is 28.34 kg/m.  Generalized: Well developed, in no acute distress    Neurological examination  Mentation: Alert oriented to time, place, history taking. Follows all commands speech and language fluent Cranial nerve II-XII: Pupils were equal round reactive to light. Extraocular movements were full, visual field were full on confrontational test. Head turning and shoulder shrug  were normal and symmetric. Motor: The motor testing reveals 5 over 5 strength of all 4 extremities. Good symmetric motor tone is noted throughout.  Moderate impairment of finger taps in the left hand.  Mild impairment on the right.  Moderate impairment of toe taps bilaterally Sensory: Sensory testing is intact to soft touch on all 4 extremities. No evidence of extinction is noted. Resting tremor noted in left arm. Coordination: Cerebellar testing reveals good finger-nose-finger and heel-to-shin bilaterally.  Gait and station: Patient uses a cane when ambulating.  Stooped posture. Leans to the right. Good stride. Reflexes: Deep tendon reflexes are symmetric and normal bilaterally.   DIAGNOSTIC DATA (LABS, IMAGING, TESTING) - I reviewed patient records, labs, notes, testing and imaging myself where available.     ASSESSMENT AND PLAN 87 y.o. year old male  has a past medical history of Arthritis, Asthma, Benign essential tremor, BPH (benign prostatic hyperplasia), Central serous retinopathy, COAG (chronic open-angle glaucoma), Colon polyps, Diverticulitis, Essential hypertension (06/22/2018), Glaucoma, Heart block AV complete ([place]), Hypercholesterolemia (06/22/2018), Hyperlipidemia, Hypertension, Macular degeneration, Macular hole, NSVT (nonsustained ventricular tachycardia) (06/22/2018), OSA on CPAP, Pacemaker [employer] Dual chamber PACEMAKER ACCOLADE DR-EL (01/14/2021), Pneumonia (05/21/2020), Pseudophakia of both eyes, Syncope and collapse (05/07/2018), and 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 here wih:  1.  Obstructive sleep apnea on CPAP  -Good compliance -Residual AHI elevated -most  of the events are listed as unknown? - continue Provigil 150 mg daily PRN-he will call when he needs a new prescription -Encourage patient continue using CPAP nightly and greater than 4 hours each night  2.  Parkinson's disease  -Continue Sinemet 1 tablet 3 times a day  3.  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 Krista Blue  Follow-up in 6 months or sooner if needed  .  Ward Givens, MSN, NP-C 03/26/2021, 9:54 AM [facility] [address], [place],  [address] [phone]

## 2021-03-27 ENCOUNTER — Ambulatory Visit: Payer: Medicare Other | Admitting: Cardiology

## 2021-03-28 ENCOUNTER — Telehealth: Payer: Self-pay | Admitting: *Deleted

## 2021-03-28 ENCOUNTER — Encounter: Payer: Self-pay | Admitting: *Deleted

## 2021-03-28 NOTE — Telephone Encounter (Signed)
Modafinil PA, key [id number]. Your information has been sent to [facility].

## 2021-03-28 NOTE — Telephone Encounter (Signed)
Request Reference Number: [id number]. MODAFINIL TAB 100MG  is approved through 04/27/2022. Your patient may now fill this prescription and it will be covered. Sent my chart.

## 2021-04-04 ENCOUNTER — Other Ambulatory Visit: Payer: Self-pay

## 2021-04-04 MED ORDER — APIXABAN 5 MG PO TABS
5.0000 mg | ORAL_TABLET | Freq: Two times a day (BID) | ORAL | Status: DC
Start: 2021-04-04 — End: 2021-04-05

## 2021-04-05 ENCOUNTER — Other Ambulatory Visit: Payer: Self-pay

## 2021-04-05 MED ORDER — APIXABAN 5 MG PO TABS
5.0000 mg | ORAL_TABLET | Freq: Two times a day (BID) | ORAL | 1 refills | Status: DC
Start: 2021-04-05 — End: 2021-05-28

## 2021-04-18 ENCOUNTER — Telehealth: Payer: Self-pay | Admitting: Cardiology

## 2021-04-18 NOTE — Telephone Encounter (Signed)
I called the patient today and discussed with him that his pacemaker transmitter is offline, advised him to reconnect, to call us back if he has difficulty so we can troubleshoot.

## 2021-04-24 ENCOUNTER — Other Ambulatory Visit: Payer: Self-pay

## 2021-04-24 ENCOUNTER — Encounter: Payer: Self-pay | Admitting: Cardiology

## 2021-04-24 ENCOUNTER — Ambulatory Visit: Payer: Medicare Other | Admitting: Cardiology

## 2021-04-24 VITALS — BP 130/82 | HR 70 | Ht 66.0 in | Wt 173.0 lb

## 2021-04-24 DIAGNOSIS — I4821 Permanent atrial fibrillation: Secondary | ICD-10-CM | POA: Diagnosis not present

## 2021-04-24 DIAGNOSIS — I442 Atrioventricular block, complete: Secondary | ICD-10-CM | POA: Diagnosis not present

## 2021-04-24 DIAGNOSIS — Z950 Presence of cardiac pacemaker: Secondary | ICD-10-CM

## 2021-04-24 NOTE — Progress Notes (Signed)
Electrophysiology Office Follow up Visit Note:    Date:  04/24/2021   ID:  Nicholas Robbins, DOB 09/13/1928, MRN [id number]  PCP:  Crist Infante, MD   [facility] Electrophysiologist:  Vickie Epley, MD    Interval History:    Nicholas Robbins is a 87 y.o. male who presents for a follow up visit.  He had a dual-chamber permanent pacemaker implanted January 14, 2021.  His pacemaker system includes a left bundle area pacemaker lead.  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  He is done well since implant.         Past Medical History:  Diagnosis Date   Arthritis    Asthma    Benign essential tremor    BPH (benign prostatic hyperplasia)    Central serous retinopathy    COAG (chronic open-angle glaucoma)    Colon polyps    Diverticulitis    Essential hypertension 06/22/2018   Glaucoma    Heart block AV complete ([place])    Hypercholesterolemia 06/22/2018   Hyperlipidemia    Hypertension    Macular degeneration    Macular hole    NSVT (nonsustained ventricular tachycardia) 06/22/2018   Event Monitor 30 days 05/10/2018: Performed for syncope Predominant rhythm is sinus rhythm with first-degree AV block. No symptoms reported. One 4 beat VT at 8:46PM, Occasional PVCs.   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    Past Surgical History:  Procedure Laterality Date   BACK SURGERY     lumbar fusion   broken ankle  1961   CARDIOVERSION N/A 06/19/2020   Procedure: CARDIOVERSION;  Surgeon: Adrian Prows, MD;  Location: [place];  Service: Cardiovascular;  Laterality: N/A;   CARDIOVERSION N/A 02/12/2021   Procedure: CARDIOVERSION;  Surgeon: Nigel Mormon, MD;  Location: [place] [facility];  Service: Cardiovascular;  Laterality: N/A;   CARPAL TUNNEL RELEASE Right 10/15/2017   Procedure: RIGHT CARPAL  TUNNEL RELEASE;  Surgeon: Daryll Brod, MD;  Location: [place];  Service: Orthopedics;  Laterality: Right;   CATARACT EXTRACTION, BILATERAL     HERNIA REPAIR     PACEMAKER IMPLANT N/A 01/14/2021   Procedure: PACEMAKER IMPLANT;  Surgeon: Vickie Epley, MD;  Location: [place] [facility];  Service: Cardiovascular;  Laterality: N/A;   REPLACEMENT TOTAL KNEE  2005   VITRECTOMY Left 2006    Current Medications: Current Meds  Medication Sig   acetaminophen (TYLENOL) 500 MG tablet Take 1,000 mg by mouth every 6 (six) hours as needed for moderate pain.   alfuzosin (UROXATRAL) 10 MG 24 hr tablet Take 10 mg by mouth daily.   apixaban (ELIQUIS) 5 MG TABS tablet Take 1 tablet (5 mg total) by mouth 2 (two) times daily.   BOTOX 100 units SOLR injection Inject 100 Units into the muscle every 3 (three) months.   carbidopa-levodopa (SINEMET IR) 25-100 MG tablet Take 1 tablet by mouth 3 (three) times daily.   COD LIVER OIL PO Take 5 mLs by mouth daily.   ELDERBERRY PO Take 100 mg by mouth daily.   fexofenadine (ALLEGRA) 180 MG tablet Take 180 mg by mouth every evening.   finasteride (PROSCAR) 5 MG tablet Take 5 mg by mouth daily.   folic acid (FOLVITE) Q000111Q MCG tablet Take 800 mcg by mouth every evening.  Glucosamine-Chondroitin (COSAMIN DS PO) Take 1 tablet by mouth in the morning and at bedtime.   hyoscyamine (LEVSIN SL) 0.125 MG SL tablet Place 0.125 mg under the tongue as needed for cramping.   losartan-hydrochlorothiazide (HYZAAR) 50-12.5 MG tablet TAKE 1 TABLET BY MOUTH EVERY DAY IN THE MORNING   modafinil (PROVIGIL) 100 MG tablet TAKE 1.5 TABLETS BY MOUTH DAILY (Patient taking differently: Take 150 mg by mouth daily as needed (to prevent sleep while driving).)   Multiple Vitamin (MULTIVITAMIN WITH MINERALS) TABS tablet Take 1 tablet by mouth daily. Without iron   Multiple Vitamins-Minerals (PRESERVISION AREDS 2 PO) Take 1 tablet by mouth in the morning and at bedtime.    pravastatin (PRAVACHOL) 40 MG tablet Take 40 mg by mouth at bedtime.   Probiotic Product (ALIGN PO) Take 1 capsule by mouth daily.   silodosin (RAPAFLO) 8 MG CAPS capsule Take 8 mg by mouth daily with breakfast.   Travoprost, BAK Free, (TRAVATAN) 0.004 % SOLN ophthalmic solution Place 1 drop into both eyes at bedtime.   triamcinolone (NASACORT) 55 MCG/ACT AERO nasal inhaler Place 2 sprays into the nose at bedtime.   triamcinolone lotion (KENALOG) 0.1 % SMARTSIG:Topical Every Evening   vitamin B-12 (CYANOCOBALAMIN) 1000 MCG tablet Take 1,000 mcg by mouth daily.   vitamin C (ASCORBIC ACID) 250 MG tablet Take 250 mg by mouth daily.   zinc gluconate 50 MG tablet Take 50 mg by mouth daily.     Allergies:   Patient has no known allergies.   Social History   Socioeconomic History   Marital status: Single    Spouse name: Not on file   Number of children: 0   Years of education: Not on file   Highest education level: Not on file  Occupational History   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  Lives at home,  With [facility].  Works at [employer].  [employer], post graduate.   Single, no kids.     Social Determinants of Health   Financial Resource Strain: Not on file  Food Insecurity: Not on file  Transportation Needs: Not on file  Physical Activity: Not on file  Stress: Not on file  Social Connections: Not on file     Family History: The patient's family history includes Breast cancer in his sister; Hypertension in his mother; Lymphoma in his brother; Stroke in his mother; Ulcers (age of onset: 57) in his father. There is no history of  Colon cancer, Stomach cancer, Rectal cancer, Esophageal cancer, or Liver cancer.  ROS:   Please see the history of present illness.    All other systems reviewed and are negative.  EKGs/Labs/Other Studies Reviewed:    The following studies were reviewed today:  April 24, 2021 in clinic device interrogation personally reviewed Lead parameter stable Battery longevity 9 years Ventricular pacing 97% 67 % A. fib  EKG:  The ekg ordered today demonstrates atrial fibrillation, ventricular pacing.  QRS duration 110 ms.  Recent Labs: 01/03/2021: NT-Pro BNP 2,890 01/13/2021: ALT 72; B Natriuretic Peptide 347.0; Platelets 176 01/14/2021: TSH 2.762  02/12/2021: BUN 12; Creatinine, Ser 0.80; Hemoglobin 13.6; Potassium 4.0; Sodium 134  Recent Lipid Panel No results found for: CHOL, TRIG, HDL, CHOLHDL, VLDL, LDLCALC, LDLDIRECT  Physical Exam:    VS:  BP 130/82    Pulse 70    Ht 5\' 6"  (1.676 m)    Wt 173 lb (78.5 kg)    SpO2 99%    BMI 27.92 kg/m     Wt Readings from Last 3 Encounters:  04/24/21 173 lb (78.5 kg)  03/26/21 175 lb 9.6 oz (79.7 kg)  02/20/21 170 lb 12.8 oz (77.5 kg)     GEN:  Well nourished, well developed in no acute distress HEENT: Normal NECK: No JVD; No carotid bruits LYMPHATICS: No lymphadenopathy CARDIAC: RRR, no murmurs, rubs, gallops.  Pacemaker pocket well-healed. RESPIRATORY:  Clear to auscultation without rales, wheezing or rhonchi  ABDOMEN: Soft, non-tender, non-distended MUSCULOSKELETAL:  No edema; No deformity  SKIN: Warm and dry NEUROLOGIC:  Alert and oriented x 3 PSYCHIATRIC:  Normal affect        ASSESSMENT:    1. Permanent atrial fibrillation ([place])   2. Heart block AV complete ([place])   3. Pacemaker [employer] Dual chamber PACEMAKER ACCOLADE DR-EL 01/14/2021    PLAN:    In order of problems listed above:   #Persistent atrial fibrillation Out of rhythm today.  On chronic anticoagulation with Eliquis.  Recommend he continue taking this  medication.  Follow-up with his primary cardiologist Dr. Einar Gip.  #Complete heart block post permanent pacemaker Device functioning appropriately.  Evidence of left bundle capture on EKG.   Follow-up with me on an as-needed basis.       Medication Adjustments/Labs and Tests Ordered: Current medicines are reviewed at length with the patient today.  Concerns regarding medicines are outlined above.  Orders Placed This Encounter  Procedures   EKG 12-Lead   No orders of the defined types were placed in this encounter.    Signed, Lars Mage, MD, [facility], [facility] 04/24/2021 10:00 PM    Electrophysiology [facility]

## 2021-04-24 NOTE — Patient Instructions (Addendum)
Medication Instructions:  Your physician recommends that you continue on your current medications as directed. Please refer to the Current Medication list given to you today. *If you need a refill on your cardiac medications before your next appointment, please call your pharmacy*  Lab Work: None ordered. If you have labs (blood work) drawn today and your tests are completely normal, you will receive your results only by: MyChart Message (if you have MyChart) OR A paper copy in the mail If you have any lab test that is abnormal or we need to change your treatment, we will call you to review the results.  Testing/Procedures: None ordered.  Follow-Up: At [facility], you and your health needs are our priority.  As part of our continuing mission to provide you with exceptional heart care, we have created designated Provider Care Teams.  These Care Teams include your primary Cardiologist (physician) and Advanced Practice Providers (APPs -  Physician Assistants and Nurse Practitioners) who all work together to provide you with the care you need, when you need it.  Your next appointment:   Your physician wants you to follow-up as needed   

## 2021-04-30 ENCOUNTER — Telehealth: Payer: Self-pay | Admitting: Neurology

## 2021-04-30 NOTE — Telephone Encounter (Signed)
Patient has a Botox appointment 1/11. I called [facility] @ [phone] to schedule delivery of Botox. They are needing patient's consent. I called the patient and LVM to remind him.   Patient has [facility] Medicare which requires PA for Botox. I completed PA via [facility] portal requesting 100 units of Botox [id number]) for K11.7. Request is pending. Pending PA [id number].

## 2021-05-01 NOTE — Telephone Encounter (Signed)
Received approval from [facility] Medicare. PA #[id number] (04/30/21- 04/30/22).

## 2021-05-06 NOTE — Telephone Encounter (Signed)
I called [facility] today to check order status. Botox TBD 1/12.

## 2021-05-07 ENCOUNTER — Telehealth: Payer: Self-pay

## 2021-05-08 ENCOUNTER — Other Ambulatory Visit: Payer: Self-pay

## 2021-05-08 ENCOUNTER — Ambulatory Visit: Payer: Medicare Other | Admitting: Neurology

## 2021-05-08 VITALS — BP 123/58 | HR 74

## 2021-05-08 DIAGNOSIS — K117 Disturbances of salivary secretion: Secondary | ICD-10-CM | POA: Diagnosis not present

## 2021-05-08 MED ORDER — ONABOTULINUMTOXINA 100 UNITS IJ SOLR
100.0000 [IU] | Freq: Once | INTRAMUSCULAR | Status: AC
  Administered 2021-05-08: 100 [IU] via INTRAMUSCULAR

## 2021-05-08 NOTE — Progress Notes (Signed)
Botox 100 units  [id number] Lot-06/2023 Exp-06/2023

## 2021-05-08 NOTE — Progress Notes (Signed)
PATIENT: Nicholas Robbins DOB: 15-Dec-1928  REASON FOR VISIT: follow up HISTORY FROM: patient  HISTORY OF PRESENT ILLNESS: Today 05/08/21:  [name] is a 88 year old male with a history of Parkinson's disease and obstructive sleep apnea on CPAP.  He returns today for follow-up.  OSA: He reports that CPAP works well for him.  He states that he has the nasal mask.  Reports that his mouth fills with saliva during the night.  Does not think he would be able to use a full facemask.  Also states that he feels that he is a mouth breather.  Parkinsons: Patient continues to have tremor in the left upper extremity.  Denies any trouble chewing or swallowing food.  Reports that he did have an episode of decreased heart rate and passed out.  He has since then received a pacemaker and is doing well.  He is in an exercise class twice a week for gait and balance.  Continue Sinemet 3 times a day.  He has tried taking it 4 times a day but he did not notice any additional benefit.  He feels that the Botox injections has helped with sialorrhea.  Although he does note that it has not been a significant benefit.  09/18/20: [name] is an 88 year old male with a history of Parkinson's disease and obstructive sleep apnea on CPAP.  He returns today for follow-up.   At the last visit a mask refitting was ordered for the patient.  He states that they change in size of mass to a small however they have continued to send the large mass.  He states that he has tried reaching out but nothing has changed.He takes Provigil as needed.  He remains on Sinemet 25-100 mg 1 tablet TID.  He feels that the tremor in his left arm has increased.  Denies any changes with his gait.  Reports that his balance is slightly off due to his back issues.  Denies any falls.  Denies any trouble chewing or swallowing food.  He returns today for an evaluation.      His CPAP download indicates that he uses machine nightly for compliance of 100%.   He uses machine greater than 4 hours each night.  On average he uses his machine 6 hours and 49 minutes.  His residual AHI is 14.3 on 5.6-16 centimeters of water.  Leak in the 95th percentile is 35.9 L/min.  Reports that he continues to take Provigil but only takes it on an as-needed basis.  He does feel that it is not strong enough states that he gets still get sleepy especially when driving.  States that he does have the mask on relatively tight.  He has nasal pillows.  States that there are several nights that he wakes up with bubbles in his mouth.  He remains on Sinemet 1 tablet 3 times a day.  Tremor in the left hand.  Denies any significant changes with his gait or balance.  Uses a cane when ambulating.  Able to complete all ADLs independently.  Denies any trouble chewing or swallowing food.  Returns today for an evaluation.  HISTORY 09/13/19: [name] is an 88 year old male with a history of Parkinson disease and obstructive sleep apnea on CPAP.  His download indicates that he uses machine nightly for compliance of 100%.  He uses machine greater than 4 hours each night.  On average he uses his machine 7 hours and 15 minutes.  His residual AHI is 7.7 on  5 to 16 cm of water with EPR of 1.  Leak in the 95th percentile is 35.4 L/min.  He remains on modafinil but only uses this when he is traveling.   He continues to take Sinemet 1 tablet 3 times a day.  Reports that he often misses the second dose.  Reports that he continues to have a tremor in the left hand.  Denies any significant changes with his gait or balance.  He does use a cane.  Reports that he is a little more stiff at times.  Denies any trouble chewing or swallowing food.  REVIEW OF SYSTEMS: Out of a complete 14 system review of symptoms, the patient complains only of the following symptoms, and all other reviewed systems are negative.  See HPI  ALLERGIES: No Known Allergies  HOME MEDICATIONS: Outpatient Medications Prior to Visit   Medication Sig Dispense Refill   acetaminophen (TYLENOL) 500 MG tablet Take 1,000 mg by mouth every 6 (six) hours as needed for moderate pain.     alfuzosin (UROXATRAL) 10 MG 24 hr tablet Take 10 mg by mouth daily.     apixaban (ELIQUIS) 5 MG TABS tablet Take 1 tablet (5 mg total) by mouth 2 (two) times daily. 60 tablet 1   BOTOX 100 units SOLR injection Inject 100 Units into the muscle every 3 (three) months.     carbidopa-levodopa (SINEMET IR) 25-100 MG tablet Take 1 tablet by mouth 3 (three) times daily. 270 tablet 3   COD LIVER OIL PO Take 5 mLs by mouth daily.     ELDERBERRY PO Take 100 mg by mouth daily.     fexofenadine (ALLEGRA) 180 MG tablet Take 180 mg by mouth every evening.     finasteride (PROSCAR) 5 MG tablet Take 5 mg by mouth daily.  3   folic acid (FOLVITE) Q000111Q MCG tablet Take 800 mcg by mouth every evening.     Glucosamine-Chondroitin (COSAMIN DS PO) Take 1 tablet by mouth in the morning and at bedtime.     hyoscyamine (LEVSIN SL) 0.125 MG SL tablet Place 0.125 mg under the tongue as needed for cramping.     losartan-hydrochlorothiazide (HYZAAR) 50-12.5 MG tablet TAKE 1 TABLET BY MOUTH EVERY DAY IN THE MORNING 90 tablet 1   modafinil (PROVIGIL) 100 MG tablet TAKE 1.5 TABLETS BY MOUTH DAILY (Patient taking differently: Take 150 mg by mouth daily as needed (to prevent sleep while driving).) 45 tablet 3   Multiple Vitamin (MULTIVITAMIN WITH MINERALS) TABS tablet Take 1 tablet by mouth daily. Without iron     Multiple Vitamins-Minerals (PRESERVISION AREDS 2 PO) Take 1 tablet by mouth in the morning and at bedtime.     pravastatin (PRAVACHOL) 40 MG tablet Take 40 mg by mouth at bedtime.  2   Probiotic Product (ALIGN PO) Take 1 capsule by mouth daily.     silodosin (RAPAFLO) 8 MG CAPS capsule Take 8 mg by mouth daily with breakfast.     Travoprost, BAK Free, (TRAVATAN) 0.004 % SOLN ophthalmic solution Place 1 drop into both eyes at bedtime.     triamcinolone (NASACORT) 55 MCG/ACT  AERO nasal inhaler Place 2 sprays into the nose at bedtime.     triamcinolone lotion (KENALOG) 0.1 % SMARTSIG:Topical Every Evening     vitamin B-12 (CYANOCOBALAMIN) 1000 MCG tablet Take 1,000 mcg by mouth daily.     vitamin C (ASCORBIC ACID) 250 MG tablet Take 250 mg by mouth daily.     zinc gluconate 50 MG  tablet Take 50 mg by mouth daily.     No facility-administered medications prior to visit.    PAST MEDICAL HISTORY: Past Medical History:  Diagnosis Date   Arthritis    Asthma    Benign essential tremor    BPH (benign prostatic hyperplasia)    Central serous retinopathy    COAG (chronic open-angle glaucoma)    Colon polyps    Diverticulitis    Essential hypertension 06/22/2018   Glaucoma    Heart block AV complete ([place])    Hypercholesterolemia 06/22/2018   Hyperlipidemia    Hypertension    Macular degeneration    Macular hole    NSVT (nonsustained ventricular tachycardia) 06/22/2018   Event Monitor 30 days 05/10/2018: Performed for syncope Predominant rhythm is sinus rhythm with first-degree AV block. No symptoms reported. One 4 beat VT at 8:46PM, Occasional PVCs.   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    PAST SURGICAL HISTORY: Past Surgical History:  Procedure Laterality Date   BACK SURGERY     lumbar fusion   broken ankle  1961   CARDIOVERSION N/A 06/19/2020   Procedure: CARDIOVERSION;  Surgeon: Adrian Prows, MD;  Location: [facility];  Service: Cardiovascular;  Laterality: N/A;   CARDIOVERSION N/A 02/12/2021   Procedure: CARDIOVERSION;  Surgeon: Nigel Mormon, MD;  Location: [facility];  Service: Cardiovascular;  Laterality: N/A;   CARPAL TUNNEL RELEASE Right 10/15/2017   Procedure: RIGHT CARPAL TUNNEL RELEASE;  Surgeon: Daryll Brod, MD;  Location: [facility];  Service: Orthopedics;  Laterality: Right;   CATARACT EXTRACTION, BILATERAL     HERNIA REPAIR     PACEMAKER IMPLANT N/A 01/14/2021   Procedure: PACEMAKER IMPLANT;  Surgeon: Vickie Epley, MD;  Location: [facility];  Service: Cardiovascular;  Laterality: N/A;   REPLACEMENT TOTAL KNEE  2005   VITRECTOMY Left 2006    FAMILY HISTORY: Family History  Problem Relation Age of Onset   Hypertension Mother    Stroke Mother    Ulcers Father 34       bleeding/ deceased   Lymphoma Brother        cancer   Breast cancer Sister    Colon cancer Neg Hx    Stomach cancer Neg Hx    Rectal cancer Neg Hx    Esophageal cancer Neg Hx    Liver cancer Neg Hx     SOCIAL HISTORY: Social History   Socioeconomic History   Marital status: Single    Spouse name: Not on file   Number of children: 0   Years of education: Not on file   Highest education level: Not on file  Occupational History   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  Lives at home,  With [facility].  Works at [employer].  [employer], post graduate.   Single, no kids.  Social Determinants of Health   Financial Resource Strain: Not on file  Food Insecurity: Not on file  Transportation Needs: Not on file  Physical Activity: Not on file  Stress: Not on file  Social Connections: Not on file  Intimate Partner Violence: Not on file      PHYSICAL EXAM  Vitals:   05/08/21 1416  BP: (!) 123/58  Pulse: 74   There is no height or weight on file to calculate BMI.  Generalized: Well developed, in no acute distress   Neurological examination  Mentation:  Alert oriented to time, place, history taking. Follows all commands speech and language fluent Cranial nerve II-XII: Pupils were equal round reactive to light. Extraocular movements were full, visual field were full on confrontational test. Head turning and shoulder shrug  were normal and symmetric. Motor: The motor testing reveals 5 over 5 strength of all 4 extremities. Good symmetric motor tone is noted throughout.  Moderate impairment of finger taps in the left hand.  Mild impairment on the right.  Moderate impairment of toe taps bilaterally Sensory: Sensory testing is intact to soft touch on all 4 extremities. No evidence of extinction is noted. Resting tremor noted in left arm. Coordination: Cerebellar testing reveals good finger-nose-finger and heel-to-shin bilaterally.  Gait and station: Patient uses a cane when ambulating.  Stooped posture. Leans to the right. Good stride. Reflexes: Deep tendon reflexes are symmetric and normal bilaterally.   DIAGNOSTIC DATA (LABS, IMAGING, TESTING) - I reviewed patient records, labs, notes, testing and imaging myself where available.     ASSESSMENT AND PLAN 88 y.o. year old male  has a past medical history of Arthritis, Asthma, Benign essential tremor, BPH (benign prostatic hyperplasia), Central serous retinopathy, COAG (chronic open-angle glaucoma), Colon polyps, Diverticulitis, Essential hypertension (06/22/2018), Glaucoma, Heart block AV complete ([place]), Hypercholesterolemia (06/22/2018), Hyperlipidemia, Hypertension, Macular degeneration, Macular hole, NSVT (nonsustained ventricular tachycardia) (06/22/2018), OSA on CPAP, Pacemaker [employer] Dual chamber PACEMAKER ACCOLADE DR-EL (01/14/2021), Pneumonia (05/21/2020), Pseudophakia of both eyes, Syncope and collapse (05/07/2018), and 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 here wih:  1.  Obstructive sleep apnea on CPAP  -Good compliance -Residual AHI elevated -most of the events are listed as unknown? -  continue Provigil 150 mg daily PRN-he will call when he needs a new prescription -Encourage patient continue using CPAP nightly and greater than 4 hours each night  2.  Parkinson's disease  -Continue Sinemet 1 tablet 3 times a day  3.  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 Krista Blue  Follow-up in 6 months or sooner if needed  .  Ward Givens, MSN, NP-C 05/08/2021, 3:05 PM [facility] [address], [address]  Chief Complaint  Patient presents with   Procedure    Waiting in room 15 botox      ASSESSMENT AND PLAN  YOUNIS FELGAR is a 88 y.o. male   Excessive sialorrhea  I have suggested him to drink small sips of water frequently, suck on hard candy to stimulate swallowing reflex  Botox 100 units today, dissolved into 2 cc of normal saline, first injection on October 31, 2020, responded very well Repeat injection in October, 2022  50 units each side, dosage was divided along parotid gland and submandibular gland with multiple injection sites   Idiopathic Parkinson's disease  He has not been persistent with his Sinemet 25/100 mg 3 times daily, likely he will do better taking his medicine more regularly, 6 AM, 12, 6 PM  Gait abnormalities  Multifactorial, Parkinson's disease, significant scoliosis, joints pain   DIAGNOSTIC DATA (LABS, IMAGING, TESTING) - I reviewed patient records, labs, notes, testing and imaging myself where available.   HISTORICAL  Nicholas Robbins, is a 88 year old male, seen in request by nurse practitioner Vaughan Browner for evaluation of Botox injection to control his excessive sialorrhea, his primary care physician is Dr. Crist Infante, I saw him initially on October 31, 2020  I reviewed and summarized the referring note.PMHX.  HTN HLD. Atrial Fibrillation, on Eliquis 5mg  bid, see Dr. 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 center in [facility], drove himself to clinic today,  He was diagnosed with Parkinson's disease around 2018, presented with left hand tremor, difficulty writing, voice change, increased gait abnormality, he was started on Sinemet 25/100 mg, reported missing his second dose about 50% of the time  He has very regular daily routine, get up at 5:15 AM, have breakfast at 8 AM, lunch at 12, go to bed at 10:15 PM, he tried to take his medications 2 hours before each meal, supposed to take his Sinemet 25/100 at 6 AM, 10 AM, 2 PM, but he often got so busy in the middle of the day, missing his 10 AM dose, he did not notice any significant difference of his left hand tremor taking or not taking Sinemet  He has significant gait abnormality using walker for longer distances, cane for short distance, due to scoliosis and Parkinson's  He began to noticed excessive drooling since 2021, he described it is less bothersome if he is talking or eating, but when he is relaxing, he has to hold his handkerchief all the time, oftentimes woke up from sleep felt his mouth is full of saliva, he denies dysphagia, no choking episode, sleep well, no significant bowel bladder difficulties.  Update February 06, 2021: He responded well to previous Botox injection for hypersialorrhea, only recent few weeks, he noticed increased drooling, denies significant side effect, he is now taking Sinemet 25/100 at 6, a.m., 10 AM, 2 PM  Update May 08, 2021 He continued his Sinemet 25/100 mg 3 times a day, slow worsening gait abnormality, worsening kyphosis, scoliosis,  He returns for Botox injection for hypersialorrhea, was not sure about the benefit, no significant side effect noted  PHYSICAL EXAM:   Vitals:   05/08/21 1416  BP: (!) 123/58  Pulse: 74   Not recorded     There is no height or weight on file to calculate BMI.  PHYSICAL EXAMNIATION:  Gen: NAD, conversant, well nourised, well groomed              NEUROLOGICAL EXAM:  MENTAL STATUS: Speech/cognition: Awake, alert, oriented to history taking and casual conversation, mild wet speech   CRANIAL NERVES: CN II: Visual fields are full to confrontation. Pupils are round equal and briskly reactive to light. CN III, IV, VI: extraocular movement are normal. No ptosis. CN V: Facial sensation is intact to light touch CN VII: Face is symmetric with normal eye closure  CN VIII: Hearing is normal to causal conversation. CN IX, X: Phonation is normal. CN XI: Head turning and shoulder shrug are intact  MOTOR: Left hand resting tremor, left more than right mild to moderate rigidity, bradykinesia  REFLEXES: Hypoactive  SENSORY: Intact to light touch,   COORDINATION: There is no trunk or limb dysmetria noted.  GAIT/STANCE: Need push-up to get up from seated  position, rely on his cane, severe scoliosis, unsteady  REVIEW OF SYSTEMS:  Full 14 system review of systems performed and notable only for as above All other review of systems were negative.   ALLERGIES: No Known Allergies  HOME MEDICATIONS: Current Outpatient Medications  Medication Sig Dispense Refill   acetaminophen (TYLENOL) 500 MG tablet Take 1,000 mg by mouth every 6 (six) hours as needed for moderate pain.     alfuzosin (UROXATRAL) 10 MG 24 hr tablet Take 10 mg by mouth daily.     apixaban (ELIQUIS) 5 MG TABS tablet Take 1 tablet (5 mg total) by mouth 2 (two) times daily. 60 tablet 1   BOTOX 100 units SOLR injection Inject 100 Units into the muscle every 3 (three) months.     carbidopa-levodopa (SINEMET IR) 25-100 MG tablet Take 1 tablet by mouth 3 (three) times daily. 270 tablet 3   COD LIVER OIL PO Take 5 mLs by mouth daily.     ELDERBERRY PO Take 100 mg by mouth daily.     fexofenadine (ALLEGRA) 180 MG tablet Take 180 mg by mouth every evening.     finasteride (PROSCAR) 5 MG tablet Take 5 mg by mouth daily.  3   folic acid (FOLVITE) Q000111Q MCG tablet Take 800 mcg  by mouth every evening.     Glucosamine-Chondroitin (COSAMIN DS PO) Take 1 tablet by mouth in the morning and at bedtime.     hyoscyamine (LEVSIN SL) 0.125 MG SL tablet Place 0.125 mg under the tongue as needed for cramping.     losartan-hydrochlorothiazide (HYZAAR) 50-12.5 MG tablet TAKE 1 TABLET BY MOUTH EVERY DAY IN THE MORNING 90 tablet 1   modafinil (PROVIGIL) 100 MG tablet TAKE 1.5 TABLETS BY MOUTH DAILY (Patient taking differently: Take 150 mg by mouth daily as needed (to prevent sleep while driving).) 45 tablet 3   Multiple Vitamin (MULTIVITAMIN WITH MINERALS) TABS tablet Take 1 tablet by mouth daily. Without iron     Multiple Vitamins-Minerals (PRESERVISION AREDS 2 PO) Take 1 tablet by mouth in the morning and at bedtime.     pravastatin (PRAVACHOL) 40 MG tablet Take 40 mg by mouth at bedtime.  2   Probiotic Product (ALIGN PO) Take 1 capsule by mouth daily.     silodosin (RAPAFLO) 8 MG CAPS capsule Take 8 mg by mouth daily with breakfast.     Travoprost, BAK Free, (TRAVATAN) 0.004 % SOLN ophthalmic solution Place 1 drop into both eyes at bedtime.     triamcinolone (NASACORT) 55 MCG/ACT AERO nasal inhaler Place 2 sprays into the nose at bedtime.     triamcinolone lotion (KENALOG) 0.1 % SMARTSIG:Topical Every Evening     vitamin B-12 (CYANOCOBALAMIN) 1000 MCG tablet Take 1,000 mcg by mouth daily.     vitamin C (ASCORBIC ACID) 250 MG tablet Take 250 mg by mouth daily.     zinc gluconate 50 MG tablet Take 50 mg by mouth daily.     No current facility-administered medications for this visit.    PAST MEDICAL HISTORY: Past Medical History:  Diagnosis Date   Arthritis    Asthma    Benign essential tremor    BPH (benign prostatic hyperplasia)    Central serous retinopathy    COAG (chronic open-angle glaucoma)    Colon polyps    Diverticulitis    Essential hypertension 06/22/2018   Glaucoma    Heart block AV complete ([place])    Hypercholesterolemia 06/22/2018   Hyperlipidemia     Hypertension  Macular degeneration    Macular hole    NSVT (nonsustained ventricular tachycardia) 06/22/2018   Event Monitor 30 days 05/10/2018: Performed for syncope Predominant rhythm is sinus rhythm with first-degree AV block. No symptoms reported. One 4 beat VT at 8:46PM, Occasional PVCs.   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    PAST SURGICAL HISTORY: Past Surgical History:  Procedure Laterality Date   BACK SURGERY     lumbar fusion   broken ankle  1961   CARDIOVERSION N/A 06/19/2020   Procedure: CARDIOVERSION;  Surgeon: Adrian Prows, MD;  Location: [facility];  Service: Cardiovascular;  Laterality: N/A;   CARDIOVERSION N/A 02/12/2021   Procedure: CARDIOVERSION;  Surgeon: Nigel Mormon, MD;  Location: [facility];  Service: Cardiovascular;  Laterality: N/A;   CARPAL TUNNEL RELEASE Right 10/15/2017   Procedure: RIGHT CARPAL TUNNEL RELEASE;  Surgeon: Daryll Brod, MD;  Location: [facility];  Service: Orthopedics;  Laterality: Right;   CATARACT EXTRACTION, BILATERAL     HERNIA REPAIR     PACEMAKER IMPLANT N/A 01/14/2021   Procedure: PACEMAKER IMPLANT;  Surgeon: Vickie Epley, MD;  Location: [facility];  Service: Cardiovascular;  Laterality: N/A;   REPLACEMENT TOTAL KNEE  2005   VITRECTOMY Left 2006    FAMILY HISTORY: Family History  Problem Relation Age of Onset   Hypertension Mother    Stroke Mother    Ulcers Father 36       bleeding/ deceased   Lymphoma Brother        cancer   Breast cancer Sister    Colon cancer Neg Hx    Stomach cancer Neg Hx    Rectal cancer Neg Hx    Esophageal cancer Neg Hx    Liver cancer Neg Hx     SOCIAL HISTORY: Social History   Socioeconomic History   Marital status: Single    Spouse name: Not on file   Number  of children: 0   Years of education: Not on file   Highest education level: Not on file  Occupational History   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  Lives at home,  With [facility].  Works at [employer].  [employer], post graduate.   Single, no kids.     Social Determinants of Health   Financial Resource Strain: Not on file  Food Insecurity: Not on file  Transportation Needs: Not on file  Physical Activity: Not on file  Stress: Not on file  Social Connections: Not on file  Intimate Partner Violence: Not on file      Nicholas Robbins, M.D. Ph.D.  [facility] [address], [place], [place] [address] Ph: [phone] Fax: [phone]  CC:  Crist Infante, [place],  [place] [address]       [place], [place] [address] [phone]

## 2021-05-27 ENCOUNTER — Other Ambulatory Visit: Payer: Self-pay | Admitting: Cardiology

## 2021-05-29 ENCOUNTER — Other Ambulatory Visit: Payer: Self-pay | Admitting: Cardiology

## 2021-05-29 DIAGNOSIS — I5032 Chronic diastolic (congestive) heart failure: Secondary | ICD-10-CM

## 2021-06-17 ENCOUNTER — Other Ambulatory Visit: Payer: Self-pay | Admitting: Cardiology

## 2021-07-16 MED ORDER — BOTOX 100 UNITS IJ SOLR: 100.0000 [IU] | INTRAMUSCULAR | 3 refills | Status: DC

## 2021-07-16 NOTE — Telephone Encounter (Signed)
Botox 100 units sent to requested pharmacy. ?

## 2021-07-16 NOTE — Addendum Note (Signed)
Addended by: Noberto Retort C on: 07/16/2021 11:32 AM ? ? Modules accepted: Orders ? ?

## 2021-07-16 NOTE — Telephone Encounter (Signed)
Please send Botox Rx refill to [facility]. ?

## 2021-07-17 NOTE — Telephone Encounter (Signed)
Received (1) 100 unit vial of Botox from [facility]. ?

## 2021-08-05 ENCOUNTER — Telehealth: Payer: Self-pay | Admitting: Neurology

## 2021-08-05 NOTE — Telephone Encounter (Signed)
Called pt to reschedule appt, due to provider being out. ?

## 2021-08-07 ENCOUNTER — Ambulatory Visit: Payer: Medicare Other | Admitting: Neurology

## 2021-08-07 ENCOUNTER — Other Ambulatory Visit: Payer: Self-pay | Admitting: Adult Health

## 2021-08-20 NOTE — Telephone Encounter (Signed)
Opened chart in error.

## 2021-08-28 ENCOUNTER — Telehealth: Payer: Self-pay | Admitting: Cardiology

## 2021-08-28 ENCOUNTER — Ambulatory Visit: Payer: Medicare Other | Admitting: Neurology

## 2021-08-28 ENCOUNTER — Encounter: Payer: Self-pay | Admitting: Neurology

## 2021-08-28 VITALS — BP 118/62 | HR 71 | Ht 66.0 in | Wt 183.0 lb

## 2021-08-28 DIAGNOSIS — K117 Disturbances of salivary secretion: Secondary | ICD-10-CM | POA: Diagnosis not present

## 2021-08-28 DIAGNOSIS — G20 Parkinson's disease: Secondary | ICD-10-CM

## 2021-08-28 NOTE — Telephone Encounter (Signed)
Pt is requesting a call back from [facility] as he has been having some swelling in his legs and now he has noticed he has gained weight. ?

## 2021-08-28 NOTE — Progress Notes (Signed)
Botox 100 units  ?[id number] ?exp-08/2023 ?[phone] ? ?

## 2021-08-28 NOTE — Progress Notes (Signed)
? ? ?PATIENT: Nicholas Robbins ?DOB: 21-Jan-1929 ? ?REASON FOR VISIT: follow up ?HISTORY FROM: patient ? ?HISTORY OF PRESENT ILLNESS: ?Today 08/28/21: ? ?Nicholas Robbins is a 88 year old male with a history of Parkinson's disease and obstructive sleep apnea on CPAP.  He returns today for follow-up. ? ?OSA: He reports that CPAP works well for him.  He states that he has the nasal mask.  Reports that his mouth fills with saliva during the night.  Does not think he would be able to use a full facemask.  Also states that he feels that he is a mouth breather. ? ?Parkinsons: Patient continues to have tremor in the left upper extremity.  Denies any trouble chewing or swallowing food.  Reports that he did have an episode of decreased heart rate and passed out.  He has since then received a pacemaker and is doing well.  He is in an exercise class twice a week for gait and balance.  Continue Sinemet 3 times a day.  He has tried taking it 4 times a day but he did not notice any additional benefit.  He feels that the Botox injections has helped with sialorrhea.  Although he does note that it has not been a significant benefit. ? ?09/18/20: Nicholas Robbins is an 88 year old male with a history of Parkinson's disease and obstructive sleep apnea on CPAP.  He returns today for follow-up.  ? ?At the last visit a mask refitting was ordered for the patient.  He states that they change in size of mass to a small however they have continued to send the large mass.  He states that he has tried reaching out but nothing has changed.He takes Provigil as needed. ? ?He remains on Sinemet 25-100 mg 1 tablet TID.  He feels that the tremor in his left arm has increased.  Denies any changes with his gait.  Reports that his balance is slightly off due to his back issues.  Denies any falls.  Denies any trouble chewing or swallowing food.  He returns today for an evaluation. ? ? ? ? ? ?His CPAP download indicates that he uses machine nightly for compliance of 100%.   He uses machine greater than 4 hours each night.  On average he uses his machine 6 hours and 49 minutes.  His residual AHI is 14.3 on 5.6-16 centimeters of water.  Leak in the 95th percentile is 35.9 L/min.  Reports that he continues to take Provigil but only takes it on an as-needed basis.  He does feel that it is not strong enough states that he gets still get sleepy especially when driving.  States that he does have the mask on relatively tight.  He has nasal pillows.  States that there are several nights that he wakes up with bubbles in his mouth. ? ?He remains on Sinemet 1 tablet 3 times a day.  Tremor in the left hand.  Denies any significant changes with his gait or balance.  Uses a cane when ambulating.  Able to complete all ADLs independently.  Denies any trouble chewing or swallowing food.  Returns today for an evaluation. ? ?HISTORY 09/13/19: ?Nicholas Robbins is an 88 year old male with a history of Parkinson disease and obstructive sleep apnea on CPAP.  His download indicates that he uses machine nightly for compliance of 100%.  He uses machine greater than 4 hours each night.  On average he uses his machine 7 hours and 15 minutes.  His residual AHI is 7.7 on  5 to 16 cm of water with EPR of 1.  Leak in the 95th percentile is 35.4 L/min.  He remains on modafinil but only uses this when he is traveling. ?  ?He continues to take Sinemet 1 tablet 3 times a day.  Reports that he often misses the second dose.  Reports that he continues to have a tremor in the left hand.  Denies any significant changes with his gait or balance.  He does use a cane.  Reports that he is a little more stiff at times.  Denies any trouble chewing or swallowing food. ? ?REVIEW OF SYSTEMS: Out of a complete 14 system review of symptoms, the patient complains only of the following symptoms, and all other reviewed systems are negative. ? ?See HPI ? ?ALLERGIES: ?No Known Allergies ? ?HOME MEDICATIONS: ?Outpatient Medications Prior to Visit   ?Medication Sig Dispense Refill  ? acetaminophen (TYLENOL) 500 MG tablet Take 1,000 mg by mouth every 6 (six) hours as needed for moderate pain.    ? alfuzosin (UROXATRAL) 10 MG 24 hr tablet Take 10 mg by mouth daily.    ? BOTOX 100 units SOLR injection Inject 100 Units into the muscle every 3 (three) months. 1 each 3  ? carbidopa-levodopa (SINEMET IR) 25-100 MG tablet Take 1 tablet by mouth 3 (three) times daily. 270 tablet 3  ? COD LIVER OIL PO Take 5 mLs by mouth daily.    ? ELDERBERRY PO Take 100 mg by mouth daily.    ? ELIQUIS 5 MG TABS tablet TAKE 1 TABLET BY MOUTH TWICE A DAY 60 tablet 1  ? fexofenadine (ALLEGRA) 180 MG tablet Take 180 mg by mouth every evening.    ? finasteride (PROSCAR) 5 MG tablet Take 5 mg by mouth daily.  3  ? folic acid (FOLVITE) [id number] MCG tablet Take 800 mcg by mouth every evening.    ? Glucosamine-Chondroitin (COSAMIN DS PO) Take 1 tablet by mouth in the morning and at bedtime.    ? hyoscyamine (LEVSIN SL) 0.125 MG SL tablet Place 0.125 mg under the tongue as needed for cramping.    ? losartan-hydrochlorothiazide (HYZAAR) 50-12.5 MG tablet TAKE 1 TABLET BY MOUTH EVERY DAY IN THE MORNING 90 tablet 1  ? modafinil (PROVIGIL) 100 MG tablet TAKE 1 AND 1/2 TABLETS BY MOUTH EVERY DAY 45 tablet 3  ? Multiple Vitamin (MULTIVITAMIN WITH MINERALS) TABS tablet Take 1 tablet by mouth daily. Without iron    ? Multiple Vitamins-Minerals (PRESERVISION AREDS 2 PO) Take 1 tablet by mouth in the morning and at bedtime.    ? pravastatin (PRAVACHOL) 40 MG tablet Take 40 mg by mouth at bedtime.  2  ? Probiotic Product (ALIGN PO) Take 1 capsule by mouth daily.    ? silodosin (RAPAFLO) 8 MG CAPS capsule Take 8 mg by mouth daily with breakfast.    ? Travoprost, BAK Free, (TRAVATAN) 0.004 % SOLN ophthalmic solution Place 1 drop into both eyes at bedtime.    ? triamcinolone (NASACORT) 55 MCG/ACT AERO nasal inhaler Place 2 sprays into the nose at bedtime.    ? triamcinolone lotion (KENALOG) 0.1 % SMARTSIG:Topical  Every Evening    ? vitamin B-12 (CYANOCOBALAMIN) 1000 MCG tablet Take 1,000 mcg by mouth daily.    ? vitamin C (ASCORBIC ACID) 250 MG tablet Take 250 mg by mouth daily.    ? zinc gluconate 50 MG tablet Take 50 mg by mouth daily.    ? ?No facility-administered medications prior to visit.  ? ? ?  PAST MEDICAL HISTORY: ?Past Medical History:  ?Diagnosis Date  ? Arthritis   ? Asthma   ? Benign essential tremor   ? BPH (benign prostatic hyperplasia)   ? Central serous retinopathy   ? COAG (chronic open-angle glaucoma)   ? Colon polyps   ? Diverticulitis   ? Essential hypertension 06/22/2018  ? Glaucoma   ? Heart block AV complete ([place])   ? Hypercholesterolemia 06/22/2018  ? Hyperlipidemia   ? Hypertension   ? Macular degeneration   ? Macular hole   ? NSVT (nonsustained ventricular tachycardia) ([facility] [facility]) 06/22/2018  ? Event Monitor 30 days 05/10/2018: Performed for syncope Predominant rhythm is sinus rhythm with first-degree AV block. No symptoms reported. One 4 beat VT at 8:46PM, Occasional PVCs.  ? OSA on CPAP   ? use CPAP nightly  ? Pacemaker [employer] Dual chamber PACEMAKER ACCOLADE DR-EL 01/14/2021  ? Pneumonia 05/21/2020  ? Pseudophakia of both eyes   ? Syncope and collapse 05/07/2018  ? 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 ? ?PAST SURGICAL HISTORY: ?Past Surgical History:  ?Procedure Laterality Date  ? BACK SURGERY    ? lumbar fusion  ? broken ankle  1961  ? CARDIOVERSION N/A 06/19/2020  ? Procedure: CARDIOVERSION;  Surgeon: Adrian Prows, MD;  Location: [facility];  Service: Cardiovascular;  Laterality: N/A;  ? CARDIOVERSION N/A 02/12/2021  ? Procedure: CARDIOVERSION;  Surgeon: Nigel Mormon, MD;  Location: [facility];  Service: Cardiovascular;  Laterality: N/A;  ? CARPAL TUNNEL RELEASE Right 10/15/2017  ? Procedure: RIGHT CARPAL TUNNEL RELEASE;  Surgeon: Daryll Brod, MD;  Location: [facility];  Service: Orthopedics;  Laterality: Right;  ? CATARACT EXTRACTION, BILATERAL    ? HERNIA REPAIR    ?  PACEMAKER IMPLANT N/A 01/14/2021  ? Procedure: PACEMAKER IMPLANT;  Surgeon: Vickie Epley, MD;  Location: [facility];  Service: Cardiovascular;  Laterality: N/A;  ? REPLACEMENT TOTAL KNEE  2005

## 2021-08-29 NOTE — Telephone Encounter (Signed)
Called and spoke with patient. He stated that he doesn't think they are really swollen, so he will call back or see you later about it, but for now, he realized that they are not that swollen, but he has noticed some weight gain. Patient said to let you know that he will be transferred to assisted living sometime this month. Call was transferred to front desk staff to schedule FU appointment as he did not have one scheduled.

## 2021-09-03 ENCOUNTER — Ambulatory Visit: Payer: Medicare Other | Admitting: Cardiology

## 2021-09-03 ENCOUNTER — Other Ambulatory Visit: Payer: Self-pay | Admitting: Cardiology

## 2021-09-03 ENCOUNTER — Encounter: Payer: Self-pay | Admitting: Cardiology

## 2021-09-03 VITALS — BP 144/78 | HR 71 | Temp 97.9°F | Resp 16 | Ht 66.0 in | Wt 189.4 lb

## 2021-09-03 DIAGNOSIS — I5033 Acute on chronic diastolic (congestive) heart failure: Secondary | ICD-10-CM

## 2021-09-03 DIAGNOSIS — Z950 Presence of cardiac pacemaker: Secondary | ICD-10-CM

## 2021-09-03 DIAGNOSIS — I5032 Chronic diastolic (congestive) heart failure: Secondary | ICD-10-CM

## 2021-09-03 DIAGNOSIS — R0602 Shortness of breath: Secondary | ICD-10-CM

## 2021-09-03 DIAGNOSIS — I484 Atypical atrial flutter: Secondary | ICD-10-CM

## 2021-09-03 MED ORDER — POTASSIUM CHLORIDE ER 10 MEQ PO TBCR: 10.0000 meq | EXTENDED_RELEASE_TABLET | Freq: Every day | ORAL | 2 refills | Status: DC

## 2021-09-03 MED ORDER — TORSEMIDE 20 MG PO TABS: 20.0000 mg | ORAL_TABLET | Freq: Every day | ORAL | 2 refills | Status: DC

## 2021-09-03 MED ORDER — LOSARTAN POTASSIUM-HCTZ 50-12.5 MG PO TABS: 1.0000 | ORAL_TABLET | Freq: Every day | ORAL | 3 refills | Status: AC

## 2021-09-03 MED ORDER — METOPROLOL SUCCINATE ER 25 MG PO TB24: 25.0000 mg | ORAL_TABLET | Freq: Every day | ORAL | 2 refills | Status: DC

## 2021-09-03 NOTE — Progress Notes (Signed)
? ?Primary Physician/Referring:  Rodrigo Ran, MD ? ?Patient ID: Nicholas Robbins, male    DOB: [birth year], 88 y.o.   MRN: [id number] ? ?Chief Complaint  ?Patient presents with  ? Edema  ? Weight Gain  ? Follow-up  ? Shortness of Breath  ? ?HPI:   ? ?Nicholas Robbins  is a 88 y.o.male who is very active and continues to live independently and works at [employer]. [facility] in [place], [place], coronary calcification noted by CT scan in 2016,  trifascicular block on the EKG, complete heart block and was admitted to the hospital on 01/13/2021 and underwent dual-chamber pacemaker implantation on 01/14/2021.  ? ?Over the past 2 weeks he has been having worsening shortness of breath and worsening leg edema, he is in acute decompensated diastolic heart failure. ? ?Past Medical History:  ?Diagnosis Date  ? Arthritis   ? Asthma   ? Benign essential tremor   ? BPH (benign prostatic hyperplasia)   ? Central serous retinopathy   ? COAG (chronic open-angle glaucoma)   ? Colon polyps   ? Diverticulitis   ? Essential hypertension 06/22/2018  ? Glaucoma   ? Heart block AV complete ([facility])   ? Hypercholesterolemia 06/22/2018  ? Hyperlipidemia   ? Hypertension   ? Macular degeneration   ? Macular hole   ? NSVT (nonsustained ventricular tachycardia) ([facility]) 06/22/2018  ? Event Monitor 30 days 05/10/2018: Performed for syncope Predominant rhythm is sinus rhythm with first-degree AV block. No symptoms reported. One 4 beat VT at 8:46PM, Occasional PVCs.  ? OSA on CPAP   ? use CPAP nightly  ? Pacemaker [employer] Dual chamber PACEMAKER ACCOLADE DR-EL 01/14/2021  ? Pneumonia 05/21/2020  ? Pseudophakia of both eyes   ? Syncope and collapse 05/07/2018  ? 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 ?Past Surgical History:  ?Procedure Laterality Date  ? BACK SURGERY    ? lumbar fusion  ? broken ankle  1961  ? CARDIOVERSION N/A 06/19/2020  ? Procedure: CARDIOVERSION;  Surgeon: Yates Decamp, MD;  Location: [facility];  Service: Cardiovascular;  Laterality:  N/A;  ? CARDIOVERSION N/A 02/12/2021  ? Procedure: CARDIOVERSION;  Surgeon: Elder Negus, MD;  Location: [facility];  Service: Cardiovascular;  Laterality: N/A;  ? CARPAL TUNNEL RELEASE Right 10/15/2017  ? Procedure: RIGHT CARPAL TUNNEL RELEASE;  Surgeon: Cindee Salt, MD;  Location: [facility];  Service: Orthopedics;  Laterality: Right;  ? CATARACT EXTRACTION, BILATERAL    ? HERNIA REPAIR    ? PACEMAKER IMPLANT N/A 01/14/2021  ? Procedure: PACEMAKER IMPLANT;  Surgeon: Lanier Prude, MD;  Location: [facility] IN[facility];  Service: Cardiovascular;  Laterality: N/A;  ? REPLACEMENT TOTAL KNEE  2005  ? VITRECTOMY Left 2006  ? ?Family History  ?Problem Relation Age of Onset  ? Hypertension Mother   ? Stroke Mother   ? Ulcers Father 17  ?     bleeding/ deceased  ? Lymphoma Brother   ?     cancer  ? Breast cancer Sister   ? Colon cancer Neg Hx   ? Stomach cancer Neg Hx   ? Rectal cancer Neg Hx   ? Esophageal cancer Neg Hx   ? Liver cancer Neg Hx   ?  ?Social History  ? ?Tobacco Use  ? Smoking status: Never  ? Smokeless tobacco: Never  ?Substance Use Topics  ? Alcohol use: Yes  ?  Alcohol/week: 0.0 standard drinks  ?  Comment: occasional  ? ?Marital  Status: Single  ?ROS  ?Review of Systems  ?Constitutional: Negative for malaise/fatigue.  ?Cardiovascular:  Positive for dyspnea on exertion. Negative for leg swelling, palpitations and syncope.  ?Musculoskeletal:  Positive for back pain.  ?Gastrointestinal:  Negative for melena.  ?Neurological:  Positive for focal weakness and tremors.  ?Objective  ?Blood pressure (!) 144/78, pulse 71, temperature 97.9 ?F (36.6 ?C), temperature source Temporal, resp. rate 16, height 5\' 6"  (1.676 m), weight 189 lb 6.4 oz (85.9 kg), SpO2 94 %.  ? ?  09/03/2021  ?  3:36 PM 08/28/2021  ?  2:10 PM 05/08/2021  ?  2:16 PM  ?Vitals with BMI  ?Height 5\' 6"  5\' 6"    ?Weight 189 lbs 6 oz 183 lbs   ?BMI 30.58 29.55   ?Systolic 144 118 [id number]  ?Diastolic 78 62 58  ?Pulse 71 71 74  ?  ?  Physical Exam ?Constitutional:   ?   General: He is not in acute distress. ?   Appearance: He is well-developed.  ?   Comments: Mildly oebese  ?Neck:  ?   Vascular: No carotid bruit or JVD.  ?Cardiovascular:  ?   Rate and Rhythm: Normal rate and regular rhythm. No extrasystoles are present. ?   Pulses: Intact distal pulses.  ?   Heart sounds: No murmur heard. ?  No gallop.  ?Pulmonary:  ?   Effort: Pulmonary effort is normal. No accessory muscle usage.  ?   Breath sounds: Normal breath sounds.  ?Chest:  ?   Comments: Pacemaker/ICD site noted  in the left infraclavicular fossa.  ?Abdominal:  ?   General: Bowel sounds are normal.  ?   Palpations: Abdomen is soft.  ?Musculoskeletal:  ?   Right lower leg: Edema (trace) present.  ?   Left lower leg: No edema.  ? ?Laboratory examination:  ? ? ?  Latest Ref Rng & Units 02/12/2021  ? 11:50 AM 01/16/2021  ?  9:46 AM 01/13/2021  ?  9:52 PM  ?CMP  ?Glucose 70 - 99 mg/dL 098100   93   119210    ?BUN 8 - 23 mg/dL 12   20   22     ?Creatinine 0.61 - 1.24 mg/dL 1.470.80   8.290.94   5.620.90    ?Sodium 135 - 145 mmol/L 134   135   129    ?Potassium 3.5 - 5.1 mmol/L 4.0   3.7   4.3    ?Chloride 98 - 111 mmol/L 95   101   95    ?CO2 22 - 32 mmol/L  24     ?Calcium 8.9 - 10.3 mg/dL  8.6     ? ? ?  Latest Ref Rng & Units 02/12/2021  ? 11:50 AM 01/13/2021  ?  9:52 PM 01/13/2021  ?  9:43 PM  ?CBC  ?WBC 4.0 - 10.5 K/uL   10.0    ?Hemoglobin 13.0 - 17.0 g/dL 13.013.6   86.513.3   78.411.9    ?Hematocrit 39.0 - 52.0 % 40.0   39.0   36.9    ?Platelets 150 - 400 K/uL   176    ? ?Lipid Panel  ?No results found for: CHOL, TRIG, HDL, CHOLHDL, VLDL, LDLCALC, LDLDIRECT ?HEMOGLOBIN A1C ?Lab Results  ?Component Value Date  ? HGBA1C 5.6 01/14/2021  ? MPG 114.02 01/14/2021  ? ?TSH ?Recent Labs  ?  01/14/21 ?0418  ?TSH 2.762  ?  ? ?External labs:  ? ?Lab 05/22/2020: ? ?Hb 11.6/HCT 36.7, platelets not  reported.  Normal indicis. ?Cholesterol, total 159.000 m 11/03/2019 ?HDL 53 MG/DL 07/03/8586 ?LDL 85.000 mg 11/03/2019 ?Triglycerides 106.000  11/03/2019 ? ?Hemoglobin 12.400 g/d 11/03/2019 ? ?Creatinine, Serum 1.100 mg/ 11/03/2019 ?ALT (SGPT) 12.000 uni 11/03/2019 ?TSH 2.370 11/03/2019 ? ?Na/K 139/4.7  07/14/2019 ? ?Cholesterol, total 178.000 07/14/2019 ?Triglycerides 251.000 07/14/2019 ?HDL 39.000 07/14/2019 ?LDL-C 96.000 07/14/2019 ? ?BUN 22.000 07/14/2019 ?Creatinine, Serum 1.210 07/14/2019 ? ?A1C 5.700 07/14/2019 ? ?Glucose Random 117.000 07/14/2019 ? ?MicroAlbumin Urine 23.950 09/29/2018 ?MicroAlbumin/Creat 208.300 09/29/2018 ? ?PSA 7.850 06/17/2016 ? ?Medications and allergies  ?No Known Allergies  ? ?Current Outpatient Medications:  ?  acetaminophen (TYLENOL) 500 MG tablet, Take 1,000 mg by mouth every 6 (six) hours as needed for moderate pain., Disp: , Rfl:  ?  alfuzosin (UROXATRAL) 10 MG 24 hr tablet, Take 10 mg by mouth daily., Disp: , Rfl:  ?  BOTOX 100 units SOLR injection, Inject 100 Units into the muscle every 3 (three) months., Disp: 1 each, Rfl: 3 ?  carbidopa-levodopa (SINEMET IR) 25-100 MG tablet, Take 1 tablet by mouth 3 (three) times daily., Disp: 270 tablet, Rfl: 3 ?  COD LIVER OIL PO, Take 5 mLs by mouth daily., Disp: , Rfl:  ?  ELDERBERRY PO, Take 100 mg by mouth daily., Disp: , Rfl:  ?  ELIQUIS 5 MG TABS tablet, TAKE 1 TABLET BY MOUTH TWICE A DAY, Disp: 60 tablet, Rfl: 1 ?  fexofenadine (ALLEGRA) 180 MG tablet, Take 180 mg by mouth every evening., Disp: , Rfl:  ?  finasteride (PROSCAR) 5 MG tablet, Take 5 mg by mouth daily., Disp: , Rfl: 3 ?  folic acid (FOLVITE) 800 MCG tablet, Take 800 mcg by mouth every evening., Disp: , Rfl:  ?  Glucosamine-Chondroitin (COSAMIN DS PO), Take 1 tablet by mouth in the morning and at bedtime., Disp: , Rfl:  ?  hyoscyamine (LEVSIN SL) 0.125 MG SL tablet, Place 0.125 mg under the tongue as needed for cramping., Disp: , Rfl:  ?  metoprolol succinate (TOPROL-XL) 25 MG 24 hr tablet, Take 1 tablet (25 mg total) by mouth daily. Take with or immediately following a meal., Disp: 30 tablet, Rfl: 2 ?  modafinil (PROVIGIL) 100 MG  tablet, TAKE 1 AND 1/2 TABLETS BY MOUTH EVERY DAY, Disp: 45 tablet, Rfl: 3 ?  Multiple Vitamin (MULTIVITAMIN WITH MINERALS) TABS tablet, Take 1 tablet by mouth daily. Without iron, Disp: , Rfl:  ?  Multiple Vit

## 2021-09-03 NOTE — Patient Instructions (Signed)
I have sent in for water pills, torsemide and potassium pills to be taken with torsemide. ? ?Your instructions are to start 1 tablet twice daily for a week and then reduce it to 1 tablet once a day.  However in the next 4 to 5 days, if your leg swelling is completely resolved, you may certainly reduce taking it twice a day to only once a day. ? ?However also if your leg edema is completely resolved and your shortness of breath is completely resolved, you can also take the water pills as and when needed basis also ?

## 2021-09-11 ENCOUNTER — Ambulatory Visit: Payer: Medicare Other

## 2021-09-11 DIAGNOSIS — I5033 Acute on chronic diastolic (congestive) heart failure: Secondary | ICD-10-CM

## 2021-09-11 NOTE — Telephone Encounter (Signed)
Instructions are not clear for torsemide and potassium

## 2021-09-18 ENCOUNTER — Ambulatory Visit: Payer: Medicare Other | Admitting: Cardiology

## 2021-09-18 ENCOUNTER — Encounter: Payer: Self-pay | Admitting: Cardiology

## 2021-09-18 VITALS — BP 109/65 | HR 51 | Temp 97.1°F | Resp 16 | Ht 66.0 in | Wt 167.8 lb

## 2021-09-18 DIAGNOSIS — I480 Paroxysmal atrial fibrillation: Secondary | ICD-10-CM

## 2021-09-18 DIAGNOSIS — Z950 Presence of cardiac pacemaker: Secondary | ICD-10-CM

## 2021-09-18 DIAGNOSIS — I5032 Chronic diastolic (congestive) heart failure: Secondary | ICD-10-CM

## 2021-09-18 DIAGNOSIS — I5033 Acute on chronic diastolic (congestive) heart failure: Secondary | ICD-10-CM

## 2021-09-18 MED ORDER — TORSEMIDE 20 MG PO TABS: 20.0000 mg | ORAL_TABLET | ORAL | 2 refills | Status: DC

## 2021-09-18 MED ORDER — AMIODARONE HCL 200 MG PO TABS: 200.0000 mg | ORAL_TABLET | Freq: Every day | ORAL | 3 refills | Status: AC

## 2021-09-18 MED ORDER — POTASSIUM CHLORIDE ER 10 MEQ PO TBCR: 10.0000 meq | EXTENDED_RELEASE_TABLET | ORAL | 2 refills | Status: DC

## 2021-09-18 NOTE — Progress Notes (Signed)
Primary Physician/Referring:  Crist Infante, MD  Patient ID: Nicholas Robbins, male    DOB: Sep 03, 1928, 88 y.o.   MRN: [id number]  No chief complaint on file.  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   I had seen him on 09/03/2021 with acute decompensated heart failure and changed his furosemide to torsemide.  He now presents for follow-up.  States that his dyspnea has completely resolved and his leg edema is significantly improved as well and he lost about 17 pounds in weight.  He has no further dyspnea.  He is sleeping well.  Past Medical History:  Diagnosis Date   Arthritis    Asthma    Benign essential tremor    BPH (benign prostatic hyperplasia)    Central serous retinopathy    COAG (chronic open-angle glaucoma)    Colon polyps    Diverticulitis    Essential hypertension 06/22/2018   Glaucoma    Heart block AV complete ([facility])    Hypercholesterolemia 06/22/2018   Hyperlipidemia    Hypertension    Macular degeneration    Macular hole    NSVT (nonsustained ventricular tachycardia) ([facility]) 06/22/2018   Event Monitor 30 days 05/10/2018: Performed for syncope Predominant rhythm is sinus rhythm with first-degree AV block. No symptoms reported. One 4 beat VT at 8:46PM, Occasional PVCs.   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   Past Surgical History:  Procedure Laterality Date   BACK SURGERY     lumbar fusion   broken ankle  1961   CARDIOVERSION N/A 06/19/2020   Procedure:  CARDIOVERSION;  Surgeon: Adrian Prows, MD;  Location: [facility];  Service: Cardiovascular;  Laterality: N/A;   CARDIOVERSION N/A 02/12/2021   Procedure: CARDIOVERSION;  Surgeon: Nigel Mormon, MD;  Location: [facility];  Service: Cardiovascular;  Laterality: N/A;   CARPAL TUNNEL RELEASE Right 10/15/2017   Procedure: RIGHT CARPAL TUNNEL RELEASE;  Surgeon: Daryll Brod, MD;  Location: [facility];  Service: Orthopedics;  Laterality: Right;   CATARACT EXTRACTION, BILATERAL     HERNIA REPAIR     PACEMAKER IMPLANT N/A 01/14/2021   Procedure: PACEMAKER IMPLANT;  Surgeon: Vickie Epley, MD;  Location: [facility];  Service: Cardiovascular;  Laterality: N/A;   REPLACEMENT TOTAL KNEE  2005   VITRECTOMY Left 2006   Family History  Problem Relation Age of Onset   Hypertension Mother    Stroke Mother    Ulcers Father 90       bleeding/ deceased   Breast cancer Sister    Heart disease Brother    Lymphoma Brother        cancer   Colon cancer Brother    Stomach cancer Neg Hx    Rectal cancer Neg Hx    Esophageal cancer Neg Hx    Liver cancer Neg Hx     Social History   Tobacco Use   Smoking status: Never   Smokeless tobacco: Never  Substance  Use Topics   Alcohol use: Yes    Alcohol/week: 0.0 standard drinks    Comment: occasional   Marital Status: Single  ROS  Review of Systems  Constitutional: Negative for malaise/fatigue.  Cardiovascular:  Positive for leg swelling (Mild and chronic). Negative for dyspnea on exertion, palpitations and syncope.  Gastrointestinal:  Negative for melena.  Neurological:  Positive for tremors.  Objective  Blood pressure 109/65, pulse (!) 51, temperature (!) 97.1 F (36.2 C), temperature source Temporal, resp. rate 16, height 5\' 6"  (1.676 m), weight 167 lb 12.8 oz (76.1 kg), SpO2 98 %.     09/18/2021   10:12 AM 09/03/2021    3:36 PM 08/28/2021    2:10 PM  Vitals with BMI  Height 5\' 6"  5\' 6"  5\' 6"   Weight 167 lbs 13 oz  189 lbs 6 oz 183 lbs  BMI 27.1 [id number] 0000000  Systolic 0000000 [id number]  Diastolic 65 78 62  Pulse 51 71 71     Physical Exam Constitutional:      Appearance: He is well-developed.  Neck:     Vascular: No carotid bruit or JVD.  Cardiovascular:     Rate and Rhythm: Normal rate and regular rhythm. No extrasystoles are present.    Pulses: Intact distal pulses.     Heart sounds: No murmur heard.   No gallop.  Pulmonary:     Effort: Pulmonary effort is normal. No accessory muscle usage.     Breath sounds: Normal breath sounds.  Chest:     Comments: Pacemaker/ICD site noted  in the left infraclavicular fossa.  Abdominal:     General: Bowel sounds are normal.     Palpations: Abdomen is soft.  Musculoskeletal:     Right lower leg: Edema (1-2+ pitting) present.     Left lower leg: Edema (1+ pitting) present.   Laboratory examination:      Latest Ref Rng & Units 02/12/2021   11:50 AM 01/16/2021    9:46 AM 01/13/2021    9:52 PM  CMP  Glucose 70 - 99 mg/dL 100   93   210    BUN 8 - 23 mg/dL 12   20   22     Creatinine 0.61 - 1.24 mg/dL 0.80   0.94   0.90    Sodium 135 - 145 mmol/L 134   135   129    Potassium 3.5 - 5.1 mmol/L 4.0   3.7   4.3    Chloride 98 - 111 mmol/L 95   101   95    CO2 22 - 32 mmol/L  24     Calcium 8.9 - 10.3 mg/dL  8.6         Latest Ref Rng & Units 02/12/2021   11:50 AM 01/13/2021    9:52 PM 01/13/2021    9:43 PM  CBC  WBC 4.0 - 10.5 K/uL   10.0    Hemoglobin 13.0 - 17.0 g/dL 13.6   13.3   11.9    Hematocrit 39.0 - 52.0 % 40.0   39.0   36.9    Platelets 150 - 400 K/uL   176     HEMOGLOBIN A1C Lab Results  Component Value Date   HGBA1C 5.6 01/14/2021   MPG 114.02 01/14/2021   TSH Recent Labs    01/14/21 0418  TSH 2.762     External labs:   Lab 05/22/2020:  Hb 11.6/HCT 36.7, platelets not reported.  Normal indicis. Cholesterol, total 159.000 m 11/03/2019  HDL 53 MG/DL 11/03/2019 LDL 85.000 mg 11/03/2019 Triglycerides 106.000 11/03/2019  Hemoglobin  12.400 g/d 11/03/2019  Creatinine, Serum 1.100 mg/ 11/03/2019 ALT (SGPT) 12.000 uni 11/03/2019 TSH 2.370 11/03/2019  Medications and allergies  No Known Allergies   Current Outpatient Medications:    acetaminophen (TYLENOL) 500 MG tablet, Take 1,000 mg by mouth every 6 (six) hours as needed for moderate pain., Disp: , Rfl:    alfuzosin (UROXATRAL) 10 MG 24 hr tablet, Take 10 mg by mouth daily., Disp: , Rfl:    amiodarone (PACERONE) 200 MG tablet, Take 1 tablet (200 mg total) by mouth daily., Disp: 90 tablet, Rfl: 3   carbidopa-levodopa (SINEMET IR) 25-100 MG tablet, Take 1 tablet by mouth 3 (three) times daily., Disp: 270 tablet, Rfl: 3   COD LIVER OIL PO, Take 5 mLs by mouth daily., Disp: , Rfl:    ELDERBERRY PO, Take 100 mg by mouth daily., Disp: , Rfl:    ELIQUIS 5 MG TABS tablet, TAKE 1 TABLET BY MOUTH TWICE A DAY, Disp: 60 tablet, Rfl: 1   fexofenadine (ALLEGRA) 180 MG tablet, Take 180 mg by mouth every evening., Disp: , Rfl:    finasteride (PROSCAR) 5 MG tablet, Take 5 mg by mouth daily., Disp: , Rfl: 3   folic acid (FOLVITE) Q000111Q MCG tablet, Take 800 mcg by mouth every evening., Disp: , Rfl:    Glucosamine-Chondroitin (COSAMIN DS PO), Take 1 tablet by mouth in the morning and at bedtime., Disp: , Rfl:    hyoscyamine (LEVSIN SL) 0.125 MG SL tablet, Place 0.125 mg under the tongue as needed for cramping., Disp: , Rfl:    losartan-hydrochlorothiazide (HYZAAR) 50-12.5 MG tablet, Take 1 tablet by mouth daily., Disp: 90 tablet, Rfl: 3   metoprolol succinate (TOPROL-XL) 25 MG 24 hr tablet, Take 1 tablet (25 mg total) by mouth daily. Take with or immediately following a meal., Disp: 30 tablet, Rfl: 2   modafinil (PROVIGIL) 100 MG tablet, TAKE 1 AND 1/2 TABLETS BY MOUTH EVERY DAY, Disp: 45 tablet, Rfl: 3   Multiple Vitamin (MULTIVITAMIN WITH MINERALS) TABS tablet, Take 1 tablet by mouth daily. Without iron, Disp: , Rfl:    pravastatin (PRAVACHOL) 40 MG tablet, Take 40 mg by mouth at bedtime., Disp: ,  Rfl: 2   Probiotic Product (ALIGN PO), Take 1 capsule by mouth daily., Disp: , Rfl:    Travoprost, BAK Free, (TRAVATAN) 0.004 % SOLN ophthalmic solution, Place 1 drop into both eyes at bedtime., Disp: , Rfl:    triamcinolone (NASACORT) 55 MCG/ACT AERO nasal inhaler, Place 2 sprays into the nose at bedtime., Disp: , Rfl:    triamcinolone lotion (KENALOG) 0.1 %, SMARTSIG:Topical Every Evening, Disp: , Rfl:    vitamin B-12 (CYANOCOBALAMIN) 1000 MCG tablet, Take 1,000 mcg by mouth daily., Disp: , Rfl:    vitamin C (ASCORBIC ACID) 250 MG tablet, Take 250 mg by mouth daily., Disp: , Rfl:    zinc gluconate 50 MG tablet, Take 50 mg by mouth daily., Disp: , Rfl:    potassium chloride (KLOR-CON) 10 MEQ tablet, Take 1 tablet (10 mEq total) by mouth as directed. Every other day. Can take daily if fluid accumulates with Torsemide, Disp: 30 tablet, Rfl: 2   torsemide (DEMADEX) 20 MG tablet, Take 1 tablet (20 mg total) by mouth as directed. Every other day. Can take daily if fluid accumulates, Disp: 30 tablet, Rfl: 2    Radiology:   Chest x-ray PA and lateral view 05/28/2020:  Heart size is  normal, persistent bilateral pleural effusion at the lung bases compared to 05/22/2020 no change. Mild thoracic spondylosis.  Hyperinflation. Impression: Pneumonia versus minimal congestive heart failure, hyperinflation consistent with COPD.  Cardiac Studies:   Sleep study 03/31/2014 Positive for Sleep Apnea, follows Dr. Paulla Dolly myoview 06/16/2014: SPECT images demonstrate a moderate-sized mild ischemia extending from the base towards the apex in the septal wall. Left ventricle systolic function was Calculated at 50%. There was mild septal hypokinesis. This represents an intermediate risk scan.  Event Monitor 30 days 05/10/2018:  Predominant rhythm is sinus rhythm with first-degree AV block. No symptoms reported. One 4 beat VT at 8:46PM, Occasional PVCs.  Lexiscan myoview stress test 06/21/2018:  1. Lexiscan  stress test was performed. Exercise capacity was not assessed. No stress symptoms reported. Resting blood pressure was 136/72 mmHg and peak effect blood pressure was 140/70 mmHg. The resting and stress electrocardiogram demonstrated normal sinus rhythm, RBBB + LAHB, occasional PVC and normal rest repolarization.  Stress EKG is non diagnostic for ischemia as it is a pharmacologic stress.  2. The overall quality of the study is good. There is no evidence of abnormal lung activity. Stress and rest SPECT images demonstrate homogeneous tracer distribution throughout the myocardium. Gated SPECT imaging reveals mild global decrease in myocardial thickening and wall motion. The left ventricular ejection fraction was normal (46%).   3. High risk study due to reduced LVEF. No evidence of ischemia/ infarction.   Echocardiogram 06/07/2020:  Left ventricle cavity is normal in size and wall thickness. Abnormal  septal wall motion due to left bundle branch block. Normal LV systolic  function with EF 53%. Unable to evaluate diastolic function due to atrial  fibrillation. Calculated EF 53%.  Left atrial cavity is mildly dilated.  Right atrial cavity is moderately dilated.  Mild to moderate mitral regurgitation.  Moderate tricuspid regurgitation. Estimated pulmonary artery systolic  pressure 38 mmHg.  Insignificant pericardial effusion.  Compared to previous study in 2020, mild PH, pericardial effusion are new.  Tricuspid regurgitation has increased from trace to moderate.    Pacemaker Implantation: Device Clinic: [employer] Dual chamber PACEMAKER  01/14/2021   Remote pacemaker transmission 07/14/2021: AP 0%, VP 98%. Patient has returned to mostly sinus rhythm since 06/12/2021 and has had ATP for persistent atrial flutter with successful sinus rhythm. AT/AF burden since 04/24/2021 through 07/03/2021 was 72%. Normal dual-chamber pacemaker function.   Pacemaker transmission also reveals patient has been in  persistent atrial fibrillation/flutter since 07/31/2021, burden 100%.  EKG  EKG normal sinus rhythm at the rate of 84 bpm, ventricularly paced rhythm.  No further analysis.  Compared to 01/13/2021, atrial fibrillation no longer present. EKG 01/13/2021: Atrial fibrillation with controlled ventricular response, ventricularly paced rhythm at rate of 71 bpm.  No further analysis.  EKG 04/03/2020: Sinus rhythm with first-degree block at rate of 77 bpm, left axis deviation, left intrafascicular block.  Right bundle branch block.  Trifascicular block.  Poor R wave progression, cannot exclude anterolateral infarct old.  Unspecific T abnormality.   No significant change from EKG 09/19/2019,  03/17/2019.   Assessment     ICD-10-CM   1. Chronic diastolic (congestive) heart failure (HCC)  I50.32     2. Acute on chronic diastolic heart failure (HCC)  I50.33 torsemide (DEMADEX) 20 MG tablet    potassium chloride (KLOR-CON) 10 MEQ tablet    3. Paroxysmal atrial fibrillation (HCC)  I48.0 EKG 12-Lead    amiodarone (PACERONE) 200 MG tablet    4. Pacemaker  [employer] Dual chamber PACEMAKER ACCOLADE DR-EL 01/14/2021  Z95.0       CHA2DS2-VASc Score is 4.  Yearly risk of stroke: 4.8% (A, HTN, Coronary atherosclerosis).  Score of 1=0.6; 2=2.2; 3=3.2; 4=4.8; 5=7.2; 6=9.8; 7=>9.8) -(CHF; HTN; vasc disease DM,  Male = 1; Age <65 =0; 65-74 = 1,  >75 =2; stroke/embolism= 2).    Meds ordered this encounter  Medications   torsemide (DEMADEX) 20 MG tablet    Sig: Take 1 tablet (20 mg total) by mouth as directed. Every other day. Can take daily if fluid accumulates    Dispense:  30 tablet    Refill:  2   potassium chloride (KLOR-CON) 10 MEQ tablet    Sig: Take 1 tablet (10 mEq total) by mouth as directed. Every other day. Can take daily if fluid accumulates with Torsemide    Dispense:  30 tablet    Refill:  2   amiodarone (PACERONE) 200 MG tablet    Sig: Take 1 tablet (200 mg total) by mouth daily.     Dispense:  90 tablet    Refill:  3    Medications Discontinued During This Encounter  Medication Reason   BOTOX 100 units SOLR injection Completed Course   Multiple Vitamins-Minerals (PRESERVISION AREDS 2 PO)    silodosin (RAPAFLO) 8 MG CAPS capsule    torsemide (DEMADEX) 20 MG tablet    potassium chloride (KLOR-CON) 10 MEQ tablet      Recommendations:   Mr. Kiki Woodbury is a fairly active 88 y.o. 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   Patient was seen by me 2 weeks ago with acute decompensated heart failure, I will start him on torsemide and discontinue Lasix.  He has had near resolution of his leg edema and he has lost about 17 pounds in weight and is essentially asymptomatic now.  He is presently taking torsemide once a day along with potassium supplements.  Advised him to change torsemide to every other day along with potassium every other day and if he has recurrence of fluid gain/weight gain or worsening dyspnea, can go back to taking it on a daily basis.  He has responded well to the above regimen.  Today he is back in sinus rhythm.  On his last office visit he was not atrial fibrillation.  To maintain sinus rhythm, I have started him on amiodarone 200 mg daily.  He will need therapeutic drug monitoring.  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  He is relocating to [place], he should establish with a cardiologist immediately upon his relocation.  He is moving next month.  I have given him my cell phone contacts to contact me if needed.  I am also  forwarding a copy of my office note and plans to Ms. Amy Stewart-Wilmarth, RN, BA at health and wellness director at [facility] where patient will be residing. He will need routine labs CBC, CMP and Mg.    Adrian Prows, MD, [facility] 09/18/2021, 4:10 PM Office: [phone] Pager: [phone]             CC: Ms.  Naaman Plummer, RN, BA; cell: [phone]; fax [phone]

## 2021-09-20 ENCOUNTER — Other Ambulatory Visit: Payer: Self-pay | Admitting: Student

## 2021-09-24 ENCOUNTER — Ambulatory Visit: Payer: Medicare Other | Admitting: Adult Health

## 2021-09-30 NOTE — Progress Notes (Unsigned)
PATIENT: Nicholas Robbins DOB: 1928-11-08  REASON FOR VISIT: follow up HISTORY FROM: patient  Chief Complaint  Patient presents with   Follow-up    Pt in 9 with friend   Pt is here for CPAP and Parkinson's follow up  pt states his tremors are increased in left arm . Pt states parkinson's  is affecting his speech      HISTORY OF PRESENT ILLNESS: Today 10/01/21: Nicholas Robbins is a 88 year old male with a history of obstructive sleep apnea on CPAP and Parkinson's disease.  He returns today for follow-up.  Parkinson's disease: Remains on Sinemet 25-100 mg 1 tablet 3 times a day he also gets Botox injections for sialorrhea.  He reports that he has been having increased tremors in the left arm.  He is also noticed more changes with his speech. Reports that balance is not good. Uses a walker at home. Has his cane with him today. Had a fall two weeks ok. Reports that he will be evaluated for Hernia in June. Is planning to move to [place] state to an assistive living. Probably will be there in July. Family is close by.   OSA on CPAP: CPAP report is below.  Does not change out mask monthly.       03/26/21: Nicholas Robbins is a 88 year old male with a history of Parkinson's disease and obstructive sleep apnea on CPAP.  He returns today for follow-up.  OSA: He reports that CPAP works well for him.  He states that he has the nasal mask.  Reports that his mouth fills with saliva during the night.  Does not think he would be able to use a full facemask.  Also states that he feels that he is a mouth breather.  Parkinsons: Patient continues to have tremor in the left upper extremity.  Denies any trouble chewing or swallowing food.  Reports that he did have an episode of decreased heart rate and passed out.  He has since then received a pacemaker and is doing well.  He is in an exercise class twice a week for gait and balance.  Continue Sinemet 3 times a day.  He has tried taking it 4 times a day but he did not  notice any additional benefit.  He feels that the Botox injections has helped with sialorrhea.  Although he does note that it has not been a significant benefit.  09/18/20: Nicholas Robbins is an 88 year old male with a history of Parkinson's disease and obstructive sleep apnea on CPAP.  He returns today for follow-up.   At the last visit a mask refitting was ordered for the patient.  He states that they change in size of mass to a small however they have continued to send the large mass.  He states that he has tried reaching out but nothing has changed.He takes Provigil as needed.  He remains on Sinemet 25-100 mg 1 tablet TID.  He feels that the tremor in his left arm has increased.  Denies any changes with his gait.  Reports that his balance is slightly off due to his back issues.  Denies any falls.  Denies any trouble chewing or swallowing food.  He returns today for an evaluation.      His CPAP download indicates that he uses machine nightly for compliance of 100%.  He uses machine greater than 4 hours each night.  On average he uses his machine 6 hours and 49 minutes.  His residual AHI is 14.3 on  5.6-16 centimeters of water.  Leak in the 95th percentile is 35.9 L/min.  Reports that he continues to take Provigil but only takes it on an as-needed basis.  He does feel that it is not strong enough states that he gets still get sleepy especially when driving.  States that he does have the mask on relatively tight.  He has nasal pillows.  States that there are several nights that he wakes up with bubbles in his mouth.  He remains on Sinemet 1 tablet 3 times a day.  Tremor in the left hand.  Denies any significant changes with his gait or balance.  Uses a cane when ambulating.  Able to complete all ADLs independently.  Denies any trouble chewing or swallowing food.  Returns today for an evaluation.  HISTORY 09/13/19: Nicholas Robbins is an 88 year old male with a history of Parkinson disease and obstructive sleep  apnea on CPAP.  His download indicates that he uses machine nightly for compliance of 100%.  He uses machine greater than 4 hours each night.  On average he uses his machine 7 hours and 15 minutes.  His residual AHI is 7.7 on 5 to 16 cm of water with EPR of 1.  Leak in the 95th percentile is 35.4 L/min.  He remains on modafinil but only uses this when he is traveling.   He continues to take Sinemet 1 tablet 3 times a day.  Reports that he often misses the second dose.  Reports that he continues to have a tremor in the left hand.  Denies any significant changes with his gait or balance.  He does use a cane.  Reports that he is a little more stiff at times.  Denies any trouble chewing or swallowing food.  REVIEW OF SYSTEMS: Out of a complete 14 system review of symptoms, the patient complains only of the following symptoms, and all other reviewed systems are negative.  See HPI  ALLERGIES: No Known Allergies  HOME MEDICATIONS: Outpatient Medications Prior to Visit  Medication Sig Dispense Refill   acetaminophen (TYLENOL) 500 MG tablet Take 1,000 mg by mouth every 6 (six) hours as needed for moderate pain.     alfuzosin (UROXATRAL) 10 MG 24 hr tablet Take 10 mg by mouth daily.     amiodarone (PACERONE) 200 MG tablet Take 1 tablet (200 mg total) by mouth daily. 90 tablet 3   carbidopa-levodopa (SINEMET IR) 25-100 MG tablet Take 1 tablet by mouth 3 (three) times daily. 270 tablet 3   COD LIVER OIL PO Take 5 mLs by mouth daily.     ELDERBERRY PO Take 100 mg by mouth daily.     ELIQUIS 5 MG TABS tablet TAKE 1 TABLET BY MOUTH TWICE A DAY 60 tablet 1   fexofenadine (ALLEGRA) 180 MG tablet Take 180 mg by mouth every evening.     finasteride (PROSCAR) 5 MG tablet Take 5 mg by mouth daily.  3   folic acid (FOLVITE) Q000111Q MCG tablet Take 800 mcg by mouth every evening.     Glucosamine-Chondroitin (COSAMIN DS PO) Take 1 tablet by mouth in the morning and at bedtime.     hyoscyamine (LEVSIN SL) 0.125 MG SL  tablet Place 0.125 mg under the tongue as needed for cramping.     losartan-hydrochlorothiazide (HYZAAR) 50-12.5 MG tablet Take 1 tablet by mouth daily. 90 tablet 3   metoprolol succinate (TOPROL-XL) 25 MG 24 hr tablet Take 1 tablet (25 mg total) by mouth daily. Take with or  immediately following a meal. 30 tablet 2   modafinil (PROVIGIL) 100 MG tablet TAKE 1 AND 1/2 TABLETS BY MOUTH EVERY DAY 45 tablet 3   Multiple Vitamin (MULTIVITAMIN WITH MINERALS) TABS tablet Take 1 tablet by mouth daily. Without iron     potassium chloride (KLOR-CON) 10 MEQ tablet Take 1 tablet (10 mEq total) by mouth as directed. Every other day. Can take daily if fluid accumulates with Torsemide 30 tablet 2   pravastatin (PRAVACHOL) 40 MG tablet Take 40 mg by mouth at bedtime.  2   Probiotic Product (ALIGN PO) Take 1 capsule by mouth daily.     torsemide (DEMADEX) 20 MG tablet Take 1 tablet (20 mg total) by mouth as directed. Every other day. Can take daily if fluid accumulates 30 tablet 2   Travoprost, BAK Free, (TRAVATAN) 0.004 % SOLN ophthalmic solution Place 1 drop into both eyes at bedtime.     triamcinolone (NASACORT) 55 MCG/ACT AERO nasal inhaler Place 2 sprays into the nose at bedtime.     triamcinolone lotion (KENALOG) 0.1 % SMARTSIG:Topical Every Evening     vitamin B-12 (CYANOCOBALAMIN) 1000 MCG tablet Take 1,000 mcg by mouth daily.     vitamin C (ASCORBIC ACID) 250 MG tablet Take 250 mg by mouth daily.     zinc gluconate 50 MG tablet Take 50 mg by mouth daily.     No facility-administered medications prior to visit.    PAST MEDICAL HISTORY: Past Medical History:  Diagnosis Date   Arthritis    Asthma    Benign essential tremor    BPH (benign prostatic hyperplasia)    Central serous retinopathy    COAG (chronic open-angle glaucoma)    Colon polyps    Diverticulitis    Essential hypertension 06/22/2018   Glaucoma    Heart block AV complete (HCC)    Hypercholesterolemia 06/22/2018   Hyperlipidemia     Hypertension    Macular degeneration    Macular hole    NSVT (nonsustained ventricular tachycardia) (HCC) 06/22/2018   Event Monitor 30 days 05/10/2018: Performed for syncope Predominant rhythm is sinus rhythm with first-degree AV block. No symptoms reported. One 4 beat VT at 8:46PM, Occasional PVCs.   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    PAST SURGICAL HISTORY: Past Surgical History:  Procedure Laterality Date   BACK SURGERY     lumbar fusion   broken ankle  1961   CARDIOVERSION N/A 06/19/2020   Procedure: CARDIOVERSION;  Surgeon: Yates DecampGanji, Jay, MD;  [facility];  Service: Cardiovascular;  Laterality: N/A;   CARDIOVERSION N/A 02/12/2021   Procedure: CARDIOVERSION;  Surgeon: Elder NegusPatwardhan, Manish J, MD;  Location: [facility];  Service: Cardiovascular;  Laterality: N/A;   CARPAL TUNNEL RELEASE Right 10/15/2017   Procedure: RIGHT CARPAL TUNNEL RELEASE;  Surgeon: Cindee SaltKuzma, Gary, MD;  Location: [facility];  Service: Orthopedics;  Laterality: Right;   CATARACT EXTRACTION, BILATERAL     HERNIA REPAIR     PACEMAKER IMPLANT N/A 01/14/2021   Procedure: PACEMAKER IMPLANT;  Surgeon: Lanier PrudeLambert, Cameron T, MD;  Location: [facility];  Service: Cardiovascular;  Laterality: N/A;   REPLACEMENT TOTAL KNEE  2005   VITRECTOMY Left 2006    FAMILY HISTORY: Family History  Problem Relation Age of Onset   Hypertension  Mother    Stroke Mother    Ulcers Father 57       bleeding/ deceased   Breast cancer Sister    Heart disease Brother    Lymphoma Brother        cancer   Colon cancer Brother    Stomach cancer Neg Hx    Rectal cancer Neg Hx    Esophageal cancer Neg Hx    Liver cancer Neg Hx    Parkinson's disease Neg Hx    Sleep apnea Neg Hx     SOCIAL HISTORY: Social History    Socioeconomic History   Marital status: Single    Spouse name: Not on file   Number of children: 0   Years of education: Not on file   Highest education level: Not on file  Occupational History   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  Lives at home,  With [facility].  Works at [employer].  [employer], post graduate.   Single, no kids.     Social Determinants of Health   Financial Resource Strain: Not on file  Food Insecurity: Not on file  Transportation Needs: Not on file  Physical Activity: Not on file  Stress: Not on file  Social Connections: Not on file  Intimate Partner Violence: Not on file      PHYSICAL EXAM  Vitals:   10/01/21 1027  BP: 109/66  Pulse: 72  Weight: 164 lb 6.4 oz (74.6 kg)  Height: 5\' 6"  (1.676 m)   Body mass index is 26.53 kg/m.  Generalized: Well developed, in no acute distress   Neurological examination  Mentation: Alert oriented to time, place, history taking. Follows all commands speech and language fluent Cranial nerve II-XII: Pupils were equal round reactive to light. Extraocular movements were full, visual field were full on confrontational test. Head turning and shoulder shrug  were normal and symmetric. Motor: The motor testing reveals 5 over 5 strength of all 4 extremities. Good symmetric motor tone is noted throughout.  Sensory: Sensory testing is intact to soft touch on all 4 extremities. No evidence of extinction is noted. Resting tremor noted in left arm. Coordination: Cerebellar testing reveals good finger-nose-finger and heel-to-shin bilaterally. Resting tremor noted in the  left hand primarily.  Gait and station: Patient uses a cane when ambulating.  Stooped posture. Leans to the right. Good stride.   DIAGNOSTIC DATA (LABS, IMAGING, TESTING) - I reviewed patient records, labs, notes, testing and imaging myself where available.     ASSESSMENT AND PLAN 88 y.o. year old male  has a past medical history of Arthritis, Asthma, Benign essential tremor, BPH (benign prostatic hyperplasia), Central serous retinopathy, COAG (chronic open-angle glaucoma), Colon polyps, Diverticulitis, Essential hypertension (06/22/2018), Glaucoma, Heart block AV complete ([place]), Hypercholesterolemia (06/22/2018), Hyperlipidemia, Hypertension, Macular degeneration, Macular hole, NSVT (nonsustained ventricular tachycardia) ([place]) (06/22/2018), OSA on CPAP, Pacemaker [employer] Dual chamber PACEMAKER ACCOLADE DR-EL (01/14/2021), Pneumonia (05/21/2020), Pseudophakia of both eyes, Syncope and collapse (05/07/2018), and 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 here wih:  1.  Obstructive sleep apnea on CPAP  -Good compliance -Residual  AHI elevated -most likely d/t to leak -Encourage patient continue using CPAP nightly and greater than 4 hours each night  2.  Parkinson's disease  -Continue Sinemet 1 tablet 3 times a day  3.  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 Krista Blue  Patient planning to move to [place] state. Will not schedule a FU visit unless plans change.  Ward Givens, MSN, NP-C 10/01/2021, 10:44 AM [facility] [address] [place], [place] [address] [phone]

## 2021-10-01 ENCOUNTER — Ambulatory Visit: Payer: Medicare Other | Admitting: Adult Health

## 2021-10-01 ENCOUNTER — Encounter: Payer: Self-pay | Admitting: Adult Health

## 2021-10-01 VITALS — BP 109/66 | HR 72 | Ht 66.0 in | Wt 164.4 lb

## 2021-10-01 DIAGNOSIS — Z9989 Dependence on other enabling machines and devices: Secondary | ICD-10-CM | POA: Diagnosis not present

## 2021-10-01 DIAGNOSIS — G20 Parkinson's disease: Secondary | ICD-10-CM

## 2021-10-01 DIAGNOSIS — K117 Disturbances of salivary secretion: Secondary | ICD-10-CM

## 2021-10-01 DIAGNOSIS — G4733 Obstructive sleep apnea (adult) (pediatric): Secondary | ICD-10-CM | POA: Diagnosis not present

## 2021-10-01 NOTE — Patient Instructions (Signed)
Your Plan:  Continue sinemet three times a day. Monitor symptoms- can increase to four times a day if needed.  Continue CPAP If your symptoms worsen or you develop new symptoms please let us know.    Thank you for coming to see [place] at [facility]. I hope we have been able to provide you high quality care today.  You may receive a patient satisfaction survey over the next few weeks. We would appreciate your feedback and comments so that we may continue to improve ourselves and the health of our patients.

## 2021-10-02 ENCOUNTER — Ambulatory Visit: Payer: Medicare Other | Admitting: Neurology

## 2021-10-13 ENCOUNTER — Other Ambulatory Visit: Payer: Self-pay | Admitting: Cardiology

## 2021-10-13 DIAGNOSIS — I5033 Acute on chronic diastolic (congestive) heart failure: Secondary | ICD-10-CM

## 2021-10-13 DIAGNOSIS — I484 Atypical atrial flutter: Secondary | ICD-10-CM

## 2021-11-27 ENCOUNTER — Ambulatory Visit: Payer: Medicare Other | Admitting: Neurology

## 2022-04-14 ENCOUNTER — Encounter: Payer: Self-pay | Admitting: Cardiology

## 2022-06-05 IMAGING — DX DG CHEST 2V
2 series · 2 of 2 positions shown · non-contrast
Comparison: 01/14/2021

CLINICAL DATA: Pacemaker

EXAM:
CHEST - 2 VIEW

[x chest ap]
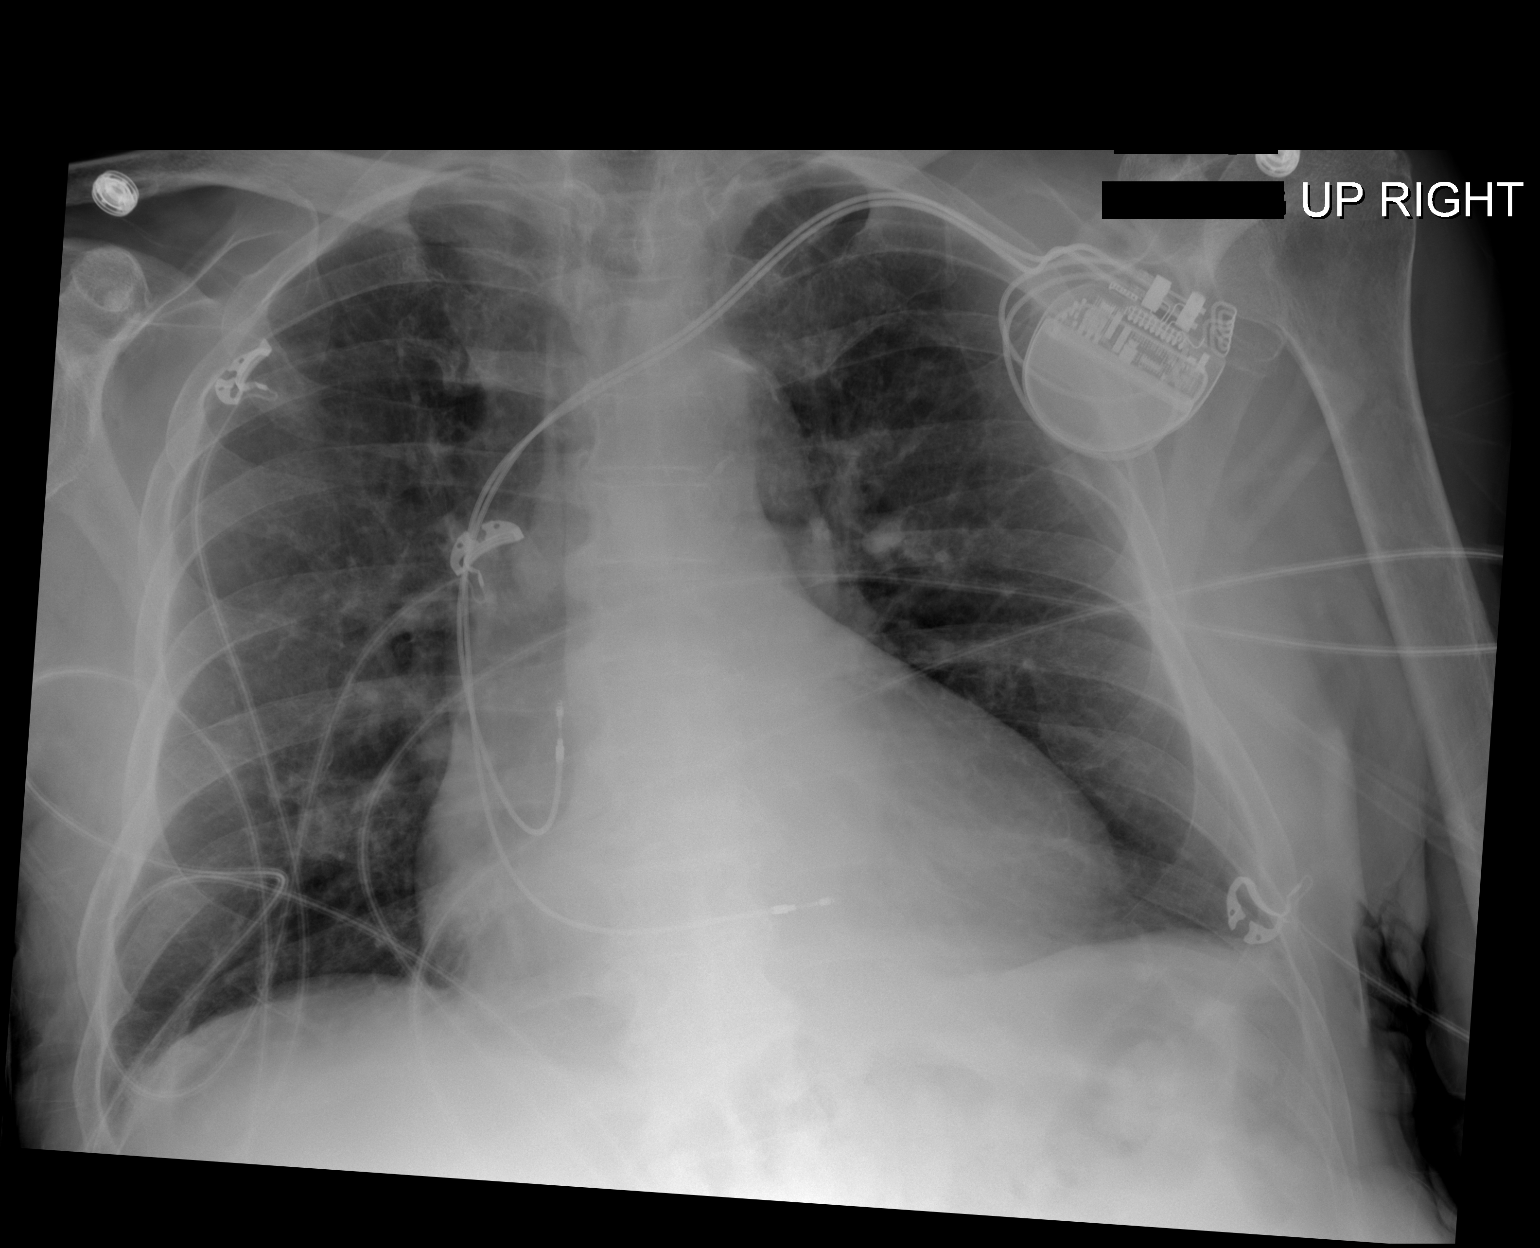

[w chest lat]
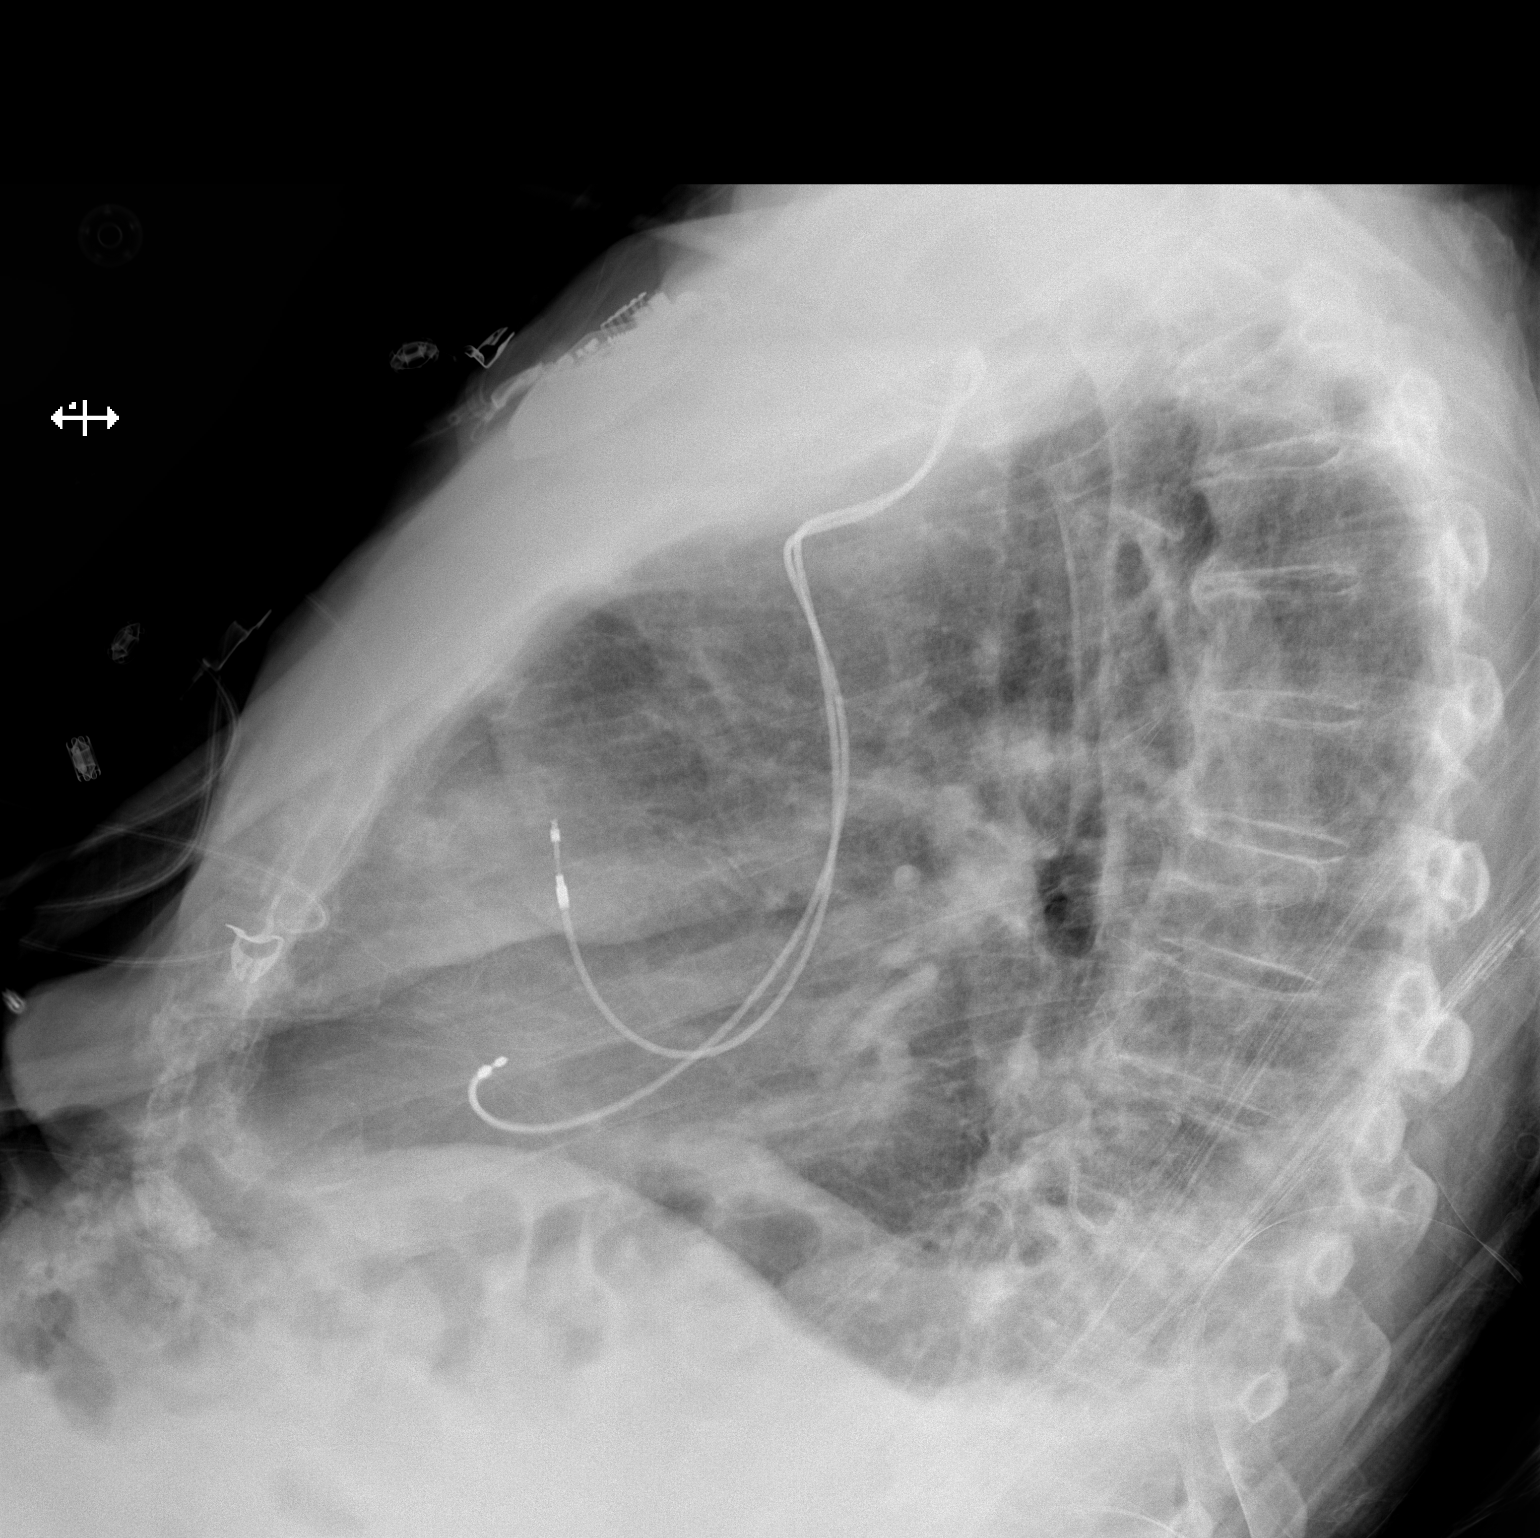

[2 of 2 positions shown; findings below may reference images not displayed]

FINDINGS: Interval placement of left chest multi lead pacer, leads projecting
in the expected vicinity of the right atrial appendage and right
ventricle. Unchanged cardiomegaly. Small bilateral pleural
effusions. Disc degenerative disease of thoracic spine.
IMPRESSION: 1.  Interval placement of left chest multi lead pacer.

2.  Small bilateral pleural effusions.

## 2022-10-14 ENCOUNTER — Encounter: Payer: Self-pay | Admitting: Cardiology

## 2023-04-15 ENCOUNTER — Ambulatory Visit (INDEPENDENT_AMBULATORY_CARE_PROVIDER_SITE_OTHER): Payer: Medicare Other

## 2023-04-15 DIAGNOSIS — I5032 Chronic diastolic (congestive) heart failure: Secondary | ICD-10-CM

## 2023-04-15 DIAGNOSIS — I480 Paroxysmal atrial fibrillation: Secondary | ICD-10-CM

## 2023-04-15 LAB — CUP PACEART REMOTE DEVICE CHECK
Battery Remaining Longevity: 126 mo
Battery Remaining Percentage: 100 %
Brady Statistic RA Percent Paced: 31 %
Brady Statistic RV Percent Paced: 97 %
Date Time Interrogation Session: 20241218002100
Implantable Lead Connection Status: 753985
Implantable Lead Connection Status: 753985
Implantable Lead Implant Date: 20220919
Implantable Lead Implant Date: 20220919
Implantable Lead Location: 753859
Implantable Lead Location: 753860
Implantable Lead Model: 7841
Implantable Lead Model: 7842
Implantable Lead Serial Number: 1099578
Implantable Lead Serial Number: 1174492
Implantable Pulse Generator Implant Date: 20220919
Lead Channel Impedance Value: 593 Ohm
Lead Channel Impedance Value: 670 Ohm
Lead Channel Pacing Threshold Amplitude: 1.5 V
Lead Channel Pacing Threshold Pulse Width: 0.4 ms
Lead Channel Setting Pacing Amplitude: 2 V
Lead Channel Setting Pacing Amplitude: 2.5 V
Lead Channel Setting Pacing Pulse Width: 0.4 ms
Lead Channel Setting Sensing Sensitivity: 3 mV
Pulse Gen Serial Number: 994178
Zone Setting Status: 755011

## 2023-05-25 NOTE — Progress Notes (Signed)
Remote ICD transmission.

## 2023-06-09 ENCOUNTER — Telehealth: Payer: Self-pay

## 2023-06-09 NOTE — Telephone Encounter (Signed)
Alert remote transmission: Atrial Arrhythmia Burden of at least 6.0 hours in a 24 hour period. PAF, longest and most recent episode on 06/06/23 at 21:04 of ongoing duration since 06/06/23 at 21:04, V rates are blunted, cannot exclude burden higher than 12% recorded due to AFL events falling into blanking/refractory, on Eliquis and Amiodarone per Epic, routed to clinic for ongoing AF with increased burden.   Follow up as scheduled.   LMTCB and assess for sx. Needs EP appointment w/ CL to reestablish care.

## 2023-06-09 NOTE — Telephone Encounter (Signed)
F/u call to explain what we needed to do with remotes to patients family member. They have our number if they have questions further

## 2023-06-09 NOTE — Telephone Encounter (Signed)
Pt moved out of state a year and a half ago. Established w/ EP per patient. Does not know who it is. Patient is going to find out and call back.

## 2023-06-10 NOTE — Telephone Encounter (Signed)
LM requesting call back.  Need to determine new EP provider to release transmissions.

## 2023-06-15 NOTE — Telephone Encounter (Signed)
Unable to contact Pt.  Will send a letter requesting Pt contact [facility] with new EP cardiology information.  Await call back.

## 2023-07-15 ENCOUNTER — Ambulatory Visit (INDEPENDENT_AMBULATORY_CARE_PROVIDER_SITE_OTHER): Payer: Medicare Other

## 2023-07-15 DIAGNOSIS — I5032 Chronic diastolic (congestive) heart failure: Secondary | ICD-10-CM | POA: Diagnosis not present

## 2023-07-16 LAB — CUP PACEART REMOTE DEVICE CHECK
Battery Remaining Longevity: 102 mo
Battery Remaining Percentage: 100 %
Brady Statistic RA Percent Paced: 0 %
Brady Statistic RV Percent Paced: 98 %
Date Time Interrogation Session: 20250319002100
Implantable Lead Connection Status: 753985
Implantable Lead Connection Status: 753985
Implantable Lead Implant Date: 20220919
Implantable Lead Implant Date: 20220919
Implantable Lead Location: 753859
Implantable Lead Location: 753860
Implantable Lead Model: 7841
Implantable Lead Model: 7842
Implantable Lead Serial Number: 1099578
Implantable Lead Serial Number: 1174492
Implantable Pulse Generator Implant Date: 20220919
Lead Channel Impedance Value: 586 Ohm
Lead Channel Impedance Value: 717 Ohm
Lead Channel Setting Pacing Amplitude: 2 V
Lead Channel Setting Pacing Amplitude: 2.5 V
Lead Channel Setting Pacing Pulse Width: 0.4 ms
Lead Channel Setting Sensing Sensitivity: 3 mV
Pulse Gen Serial Number: 994178
Zone Setting Status: 755011

## 2023-07-19 ENCOUNTER — Encounter: Payer: Self-pay | Admitting: Cardiology

## 2023-08-28 NOTE — Addendum Note (Signed)
 Addended by: Lott Rouleau A on: 08/28/2023 12:03 PM   Modules accepted: Orders

## 2023-08-28 NOTE — Progress Notes (Signed)
 Remote ICD transmission.

## 2023-10-14 ENCOUNTER — Ambulatory Visit (INDEPENDENT_AMBULATORY_CARE_PROVIDER_SITE_OTHER): Payer: Medicare Other

## 2023-10-14 DIAGNOSIS — I5032 Chronic diastolic (congestive) heart failure: Secondary | ICD-10-CM

## 2023-10-15 LAB — CUP PACEART REMOTE DEVICE CHECK
Battery Remaining Longevity: 102 mo
Battery Remaining Percentage: 96 %
Brady Statistic RA Percent Paced: 0 %
Brady Statistic RV Percent Paced: 98 %
Date Time Interrogation Session: 20250618002000
Implantable Lead Connection Status: 753985
Implantable Lead Connection Status: 753985
Implantable Lead Implant Date: 20220919
Implantable Lead Implant Date: 20220919
Implantable Lead Location: 753859
Implantable Lead Location: 753860
Implantable Lead Model: 7841
Implantable Lead Model: 7842
Implantable Lead Serial Number: 1099578
Implantable Lead Serial Number: 1174492
Implantable Pulse Generator Implant Date: 20220919
Lead Channel Impedance Value: 576 Ohm
Lead Channel Impedance Value: 600 Ohm
Lead Channel Setting Pacing Amplitude: 2 V
Lead Channel Setting Pacing Amplitude: 2.5 V
Lead Channel Setting Pacing Pulse Width: 0.4 ms
Lead Channel Setting Sensing Sensitivity: 3 mV
Pulse Gen Serial Number: 994178
Zone Setting Status: 755011

## 2023-10-16 ENCOUNTER — Ambulatory Visit: Payer: Self-pay | Admitting: Cardiology

## 2023-12-24 NOTE — Addendum Note (Signed)
 Addended by: VICCI SELLER A on: 12/24/2023 10:05 AM   Modules accepted: Orders

## 2023-12-24 NOTE — Progress Notes (Signed)
 Remote ICD transmission.

## 2024-01-13 ENCOUNTER — Ambulatory Visit (INDEPENDENT_AMBULATORY_CARE_PROVIDER_SITE_OTHER): Payer: Medicare Other

## 2024-01-13 DIAGNOSIS — I5032 Chronic diastolic (congestive) heart failure: Secondary | ICD-10-CM | POA: Diagnosis not present

## 2024-01-13 LAB — CUP PACEART REMOTE DEVICE CHECK
Battery Remaining Longevity: 96 mo
Battery Remaining Percentage: 95 %
Brady Statistic RA Percent Paced: 0 %
Brady Statistic RV Percent Paced: 97 %
Date Time Interrogation Session: 20250917002100
Implantable Lead Connection Status: 753985
Implantable Lead Connection Status: 753985
Implantable Lead Implant Date: 20220919
Implantable Lead Implant Date: 20220919
Implantable Lead Location: 753859
Implantable Lead Location: 753860
Implantable Lead Model: 7841
Implantable Lead Model: 7842
Implantable Lead Serial Number: 1099578
Implantable Lead Serial Number: 1174492
Implantable Pulse Generator Implant Date: 20220919
Lead Channel Impedance Value: 557 Ohm
Lead Channel Impedance Value: 689 Ohm
Lead Channel Setting Pacing Amplitude: 2 V
Lead Channel Setting Pacing Amplitude: 2.5 V
Lead Channel Setting Pacing Pulse Width: 0.4 ms
Lead Channel Setting Sensing Sensitivity: 3 mV
Pulse Gen Serial Number: 994178
Zone Setting Status: 755011

## 2024-01-14 ENCOUNTER — Ambulatory Visit: Payer: Self-pay | Admitting: Cardiology

## 2024-01-18 NOTE — Progress Notes (Signed)
Remote ICD Transmission.

## 2024-03-17 ENCOUNTER — Telehealth: Payer: Self-pay

## 2024-03-17 NOTE — Telephone Encounter (Signed)
 Called patient to request name of new cardiologist  Patient was short with this RN and stated, I no longer live in [place] , and I'm tired and in the hospital.  This RN stated they are aware that the patient no longer lives in [place] and then asked the patient which hospital they were in but the patient hung up the phone before any further information was retrieved  This RN then called Morton (friend on [place] list) and asked if he could contact the patient and get the information needed to transfer the patient to his new cardiologist office for continued remote monitoring  Louis told this RN that he would do his best to get the info and call back today but no guarantees  This RN verbalized understanding and gave Louis the device clinic number to call back

## 2024-03-17 NOTE — Telephone Encounter (Signed)
 Contacted Pt to schedule overdue follow up.  Scheduler spoke with Pt.  Pt indicates he moved out of the state 2 years ago.  Need to contact Pt to determine who his current cardiologist is to have them assume remote monitoring.

## 2024-03-18 NOTE — Telephone Encounter (Signed)
 Spoke to patient & nurse who was with patient at the time named Nicholas Robbins. Per Nicholas Robbins patient sees Dr. Valere Cool at [facility] in [place], [place] (device clinic).   Writer attempted to contact facility to discuss needing to transfer remote monitoring over. Office is currently closed and would need to call back during normal business hours.

## 2024-04-06 NOTE — Telephone Encounter (Signed)
 Outreach made to office of Curly Dauphin, NP with [facility] at [phone].  Spoke with nurse with Curly Dauphin NP.  She states that Pt has established care with them.  She states they do not manage devices in house, but she will send a message to their managing clinic to advise they obtain Pt's remotes in [place].  Cancelled all scheduled transmissions.    No further action needed at this time.
